# Patient Record
Sex: Male | Born: 1945 | Race: White | Hispanic: No | Marital: Married | Smoking: Former smoker
Health system: Southern US, Community
[De-identification: ages and names within clinical notes are randomized; demographics above are authoritative.]

## PROBLEM LIST (undated history)

## (undated) DIAGNOSIS — N189 Chronic kidney disease, unspecified: Secondary | ICD-10-CM

## (undated) DIAGNOSIS — Z9109 Other allergy status, other than to drugs and biological substances: Secondary | ICD-10-CM

## (undated) DIAGNOSIS — E119 Type 2 diabetes mellitus without complications: Secondary | ICD-10-CM

## (undated) DIAGNOSIS — R200 Anesthesia of skin: Secondary | ICD-10-CM

## (undated) DIAGNOSIS — K219 Gastro-esophageal reflux disease without esophagitis: Secondary | ICD-10-CM

## (undated) DIAGNOSIS — F419 Anxiety disorder, unspecified: Secondary | ICD-10-CM

## (undated) DIAGNOSIS — I10 Essential (primary) hypertension: Secondary | ICD-10-CM

## (undated) DIAGNOSIS — D649 Anemia, unspecified: Secondary | ICD-10-CM

## (undated) DIAGNOSIS — R202 Paresthesia of skin: Secondary | ICD-10-CM

## (undated) HISTORY — PX: HERNIA REPAIR: SHX51

## (undated) HISTORY — PX: COLONOSCOPY: SHX174

## (undated) HISTORY — PX: FRACTURE SURGERY: SHX138

## (undated) HISTORY — PX: TONSILLECTOMY: SUR1361

---

## 2013-03-08 NOTE — H&P (Signed)
History of Present Illness The patient is a 67 year old male who presents today for follow up of their neck. The patient is being followed for their right-sided neck pain. Symptoms reported today include: pain (right lateral cervical radiating pain into the right upper ext. to the level of the hand ) and numbness (right upper ext. ), while the patient does not report symptoms of: weakness. The following medication has been used for pain control: antiinflammatory medication (Aleve).   He returns today for a followup. At this point in time, we have had a long discussion about his cervical degenerative disease. At this point while there are cord signal changes at the C3-4. There is no significant central stenosis. The marked disease is at C5-6 and C6-7. At this point, I am hesitant to recommend a fusin at the C3-4 and C5-6 and C6-7 levels.    Allergies No Known Drug Allergies. 12/07/2012   Social History Tobacco use. never smoker   Medication History Tradjenta ( Oral) Specific dose unknown - Active. Simvastatin ( Oral) Specific dose unknown - Active. MetFORMIN HCl ER ( Oral) Specific dose unknown - Active. Losartan Potassium ( Oral) Specific dose unknown - Active. GlipiZIDE ER ( Oral) Specific dose unknown - Active. Carvedilol ( Oral) Specific dose unknown - Active.   Objective Transcription   Clinically he does not demonstrate any evidence of myelopathy. He continues to have a normal gait pattern. There is a negative Babinski test. There is no clonus. Negative Hoffman's sign. He still has C6 & 7 radiculopathy with neuropathic pain and numbness as well as dysesthesias in those distributions on the right side. There is no shortness of breath or chest pain. The abdomen is soft and nontender. No incontinence of bowel or bladder. He has a significant neck pain with range of motion.    Assessment & Plan Pain, Cervical l Risks of surgery include, but are not limited  to: Throat pain,swallowing difficulty, hoarseness or change in voice, Death, stroke, paralysis, nerve root damage/injury, bleeding, blood clots, loss of bowel/bladder control, hardware failure, or malposition, spinal fluid leak, adjacent segment disease, non-union, need for further surgery, ongoing or worse pain, infection and recurrent disc herniation   Plans Transcription  At this point in time, I think the best course of action is to do an ACDF at C5-6 and C6-7. If he begins to develop any significant symptoms related to the myelopathy, then we will need to address that at a later date. While there is foraminal stenosis which is clearly seen on the MRI, it is mild to moderate. There is some compression to both of the C4 nerve roots, but the foramen is still patent. At this point we reviewed the risks to include infection, bleeding, nerve damage, death, stroke paralysis, failure to heal and need for further surgery, ongoing or worsening pain, loss of bowel and bladder control, throat pain, swallowing difficulties, hoarseness in the voice and a need for a posterior surgery. All questions were encouraged and answered. We will plan on surgery in mid July once we get the primary physician note back. In addition, because this is a multi-level procedure and his age, we will use an external bone stimulator following surgery.

## 2013-03-14 ENCOUNTER — Encounter (HOSPITAL_COMMUNITY): Payer: Self-pay | Admitting: Pharmacy Technician

## 2013-03-16 ENCOUNTER — Encounter (HOSPITAL_COMMUNITY): Payer: Self-pay

## 2013-03-16 ENCOUNTER — Encounter (HOSPITAL_COMMUNITY)
Admission: RE | Admit: 2013-03-16 | Discharge: 2013-03-16 | Disposition: A | Discharge status: Home / Self Care | Payer: Medicare Other | Source: Ambulatory Visit | Attending: Anesthesiology | Admitting: Anesthesiology

## 2013-03-16 ENCOUNTER — Encounter (HOSPITAL_COMMUNITY)
Admission: RE | Admit: 2013-03-16 | Discharge: 2013-03-16 | Disposition: A | Discharge status: Home / Self Care | Payer: Medicare Other | Source: Ambulatory Visit | Attending: Orthopedic Surgery | Admitting: Orthopedic Surgery

## 2013-03-16 HISTORY — DX: Essential (primary) hypertension: I10

## 2013-03-16 HISTORY — DX: Paresthesia of skin: R20.2

## 2013-03-16 HISTORY — DX: Type 2 diabetes mellitus without complications: E11.9

## 2013-03-16 HISTORY — DX: Gastro-esophageal reflux disease without esophagitis: K21.9

## 2013-03-16 HISTORY — DX: Other allergy status, other than to drugs and biological substances: Z91.09

## 2013-03-16 HISTORY — DX: Anesthesia of skin: R20.0

## 2013-03-16 LAB — BASIC METABOLIC PANEL
BUN: 19 mg/dL (ref 6–23)
GFR calc Af Amer: >90 mL/min (ref >90)
GFR calc non Af Amer: 85 mL/min — ABNORMAL LOW (ref >90)
Potassium: 4.1 mEq/L (ref 3.5–5.1)
Sodium: 136 mEq/L (ref 135–145)

## 2013-03-16 LAB — CBC
MCHC: 35.6 g/dL (ref 30.0–36.0)
Platelets: 185 10*3/uL (ref 150–400)
RDW: 12.1 % (ref 11.5–15.5)
WBC: 9.6 10*3/uL (ref 4.0–10.5)

## 2013-03-16 LAB — SURGICAL PCR SCREEN
MRSA, PCR: NEGATIVE
Staphylococcus aureus: NEGATIVE

## 2013-03-16 MED ORDER — CEFAZOLIN SODIUM-DEXTROSE 2-3 GM-% IV SOLR
2.0000 g | INTRAVENOUS | Status: AC
  Administered 2013-03-17: 2 g via INTRAVENOUS
  Filled 2013-03-16: qty 50

## 2013-03-16 NOTE — Pre-Procedure Instructions (Signed)
Jorge [name]  03/16/2013   Your procedure is scheduled on: Thursday, March 17, 2013  Report to [facility] at 5:30 AM.  Call this number if you have problems the morning of surgery: [phone]   Remember:             Do not eat food or drink liquids after midnight.   Take these medicines the morning of surgery with A SIP OF WATER: carvedilol (COREG) 12.5 MG tablet  Stop taking Aspirin and herbal medications. Do not take any NSAIDs ie: Ibuprofen, Advil, Naproxen or any medication  containing Aspirin.  Do not wear jewelry, make-up or nail polish.  Do not wear lotions, powders, or perfumes. You may wear deodorant.  Do not shave 48 hours prior to surgery. Men may shave face and neck.  Do not bring valuables to the hospital.  [facility] is not responsible  for any belongings or valuables.  Contacts, dentures or bridgework may not be worn into surgery.  Leave suitcase in the car. After surgery it may be brought to your room.  For patients admitted to the hospital, checkout time is 11:00 AM the day of discharge.   Patients discharged the day of surgery will not be allowed to drive home.  Name and phone number of your driver:   Special Instructions: Shower using CHG 2 nights before surgery and the night before surgery.  If you shower the day of surgery use CHG.  Use special wash - you have one bottle of CHG for all showers.  You should use approximately 1/3 of the bottle for each shower.   Please read over the following fact sheets that you were given: Pain Booklet, Coughing and Deep Breathing, MRSA Information and Surgical Site Infection Prevention  Bring brace on day of surgery

## 2013-03-16 NOTE — Progress Notes (Signed)
Pt denies SOB, chest pain, and being under the care of a cardiologist. Pt states that an EKG was done at [facility] in [place] recently  # [id number] ; results were requested in addition to latest office notes. Pt also had a stress test done 2 years ago; results requested, received, and placed in pt chart. Pt denies having any other cardiac studies.

## 2013-03-17 ENCOUNTER — Inpatient Hospital Stay (HOSPITAL_COMMUNITY)
Admission: RE | Admit: 2013-03-17 | Discharge: 2013-03-18 | DRG: 473 | Disposition: A | Discharge status: Home Health | Payer: Medicare Other | Source: Ambulatory Visit | Attending: Orthopedic Surgery | Admitting: Orthopedic Surgery

## 2013-03-17 ENCOUNTER — Ambulatory Visit (HOSPITAL_COMMUNITY): Payer: Medicare Other

## 2013-03-17 ENCOUNTER — Encounter (HOSPITAL_COMMUNITY): Payer: Self-pay | Admitting: *Deleted

## 2013-03-17 ENCOUNTER — Encounter (HOSPITAL_COMMUNITY)
Admission: RE | Disposition: A | Discharge status: Home Health | Payer: Self-pay | Source: Ambulatory Visit | Attending: Orthopedic Surgery

## 2013-03-17 ENCOUNTER — Encounter (HOSPITAL_COMMUNITY): Payer: Self-pay | Admitting: Anesthesiology

## 2013-03-17 ENCOUNTER — Inpatient Hospital Stay (HOSPITAL_COMMUNITY): Payer: Medicare Other

## 2013-03-17 ENCOUNTER — Ambulatory Visit (HOSPITAL_COMMUNITY): Payer: Medicare Other | Admitting: Anesthesiology

## 2013-03-17 DIAGNOSIS — M5020 Other cervical disc displacement, unspecified cervical region: Secondary | ICD-10-CM | POA: Diagnosis present

## 2013-03-17 DIAGNOSIS — M47812 Spondylosis without myelopathy or radiculopathy, cervical region: Principal | ICD-10-CM | POA: Diagnosis present

## 2013-03-17 DIAGNOSIS — Z01812 Encounter for preprocedural laboratory examination: Secondary | ICD-10-CM

## 2013-03-17 DIAGNOSIS — Z79899 Other long term (current) drug therapy: Secondary | ICD-10-CM

## 2013-03-17 DIAGNOSIS — I10 Essential (primary) hypertension: Secondary | ICD-10-CM | POA: Diagnosis present

## 2013-03-17 DIAGNOSIS — E119 Type 2 diabetes mellitus without complications: Secondary | ICD-10-CM | POA: Diagnosis present

## 2013-03-17 DIAGNOSIS — K219 Gastro-esophageal reflux disease without esophagitis: Secondary | ICD-10-CM | POA: Diagnosis present

## 2013-03-17 HISTORY — PX: ANTERIOR CERVICAL DECOMP/DISCECTOMY FUSION: SHX1161

## 2013-03-17 LAB — GLUCOSE, CAPILLARY
Glucose-Capillary: 209 mg/dL — ABNORMAL HIGH (ref 70–99)
Glucose-Capillary: 249 mg/dL — ABNORMAL HIGH (ref 70–99)
Glucose-Capillary: 312 mg/dL — ABNORMAL HIGH (ref 70–99)

## 2013-03-17 SURGERY — ANTERIOR CERVICAL DECOMPRESSION/DISCECTOMY FUSION 2 LEVEL/HARDWARE REMOVAL
Anesthesia: General | Site: Neck | Wound class: Clean

## 2013-03-17 MED ORDER — METFORMIN HCL ER 500 MG PO TB24
500.0000 mg | ORAL_TABLET | Freq: Two times a day (BID) | ORAL | Status: DC
  Administered 2013-03-17 – 2013-03-18 (×2): 500 mg via ORAL
  Filled 2013-03-17 (×4): qty 1

## 2013-03-17 MED ORDER — ONDANSETRON HCL 4 MG/2ML IJ SOLN
INTRAMUSCULAR | Status: DC | PRN
  Administered 2013-03-17: 4 mg via INTRAVENOUS

## 2013-03-17 MED ORDER — CEFAZOLIN SODIUM 1-5 GM-% IV SOLN
1.0000 g | Freq: Three times a day (TID) | INTRAVENOUS | Status: AC
  Administered 2013-03-17 – 2013-03-18 (×2): 1 g via INTRAVENOUS
  Filled 2013-03-17 (×2): qty 50

## 2013-03-17 MED ORDER — METHOCARBAMOL 500 MG PO TABS: 500.0000 mg | ORAL_TABLET | Freq: Four times a day (QID) | ORAL | Status: DC | PRN

## 2013-03-17 MED ORDER — LOSARTAN POTASSIUM 50 MG PO TABS
50.0000 mg | ORAL_TABLET | Freq: Every day | ORAL | Status: DC
  Administered 2013-03-18: 50 mg via ORAL
  Filled 2013-03-17 (×2): qty 1

## 2013-03-17 MED ORDER — ONDANSETRON HCL 4 MG/2ML IJ SOLN: 4.0000 mg | Freq: Once | INTRAMUSCULAR | Status: DC | PRN

## 2013-03-17 MED ORDER — SIMVASTATIN 20 MG PO TABS
20.0000 mg | ORAL_TABLET | Freq: Every day | ORAL | Status: DC
  Administered 2013-03-17: 20 mg via ORAL
  Filled 2013-03-17 (×2): qty 1

## 2013-03-17 MED ORDER — PROPOFOL 10 MG/ML IV BOLUS
INTRAVENOUS | Status: DC | PRN
  Administered 2013-03-17: 200 mg via INTRAVENOUS

## 2013-03-17 MED ORDER — OXYCODONE HCL 5 MG PO TABS
10.0000 mg | ORAL_TABLET | ORAL | Status: DC | PRN
  Administered 2013-03-17: 10 mg via ORAL
  Filled 2013-03-17: qty 2

## 2013-03-17 MED ORDER — SODIUM CHLORIDE 0.9 % IV SOLN: 250.0000 mL | INTRAVENOUS | Status: DC

## 2013-03-17 MED ORDER — BUPIVACAINE-EPINEPHRINE 0.25% -1:200000 IJ SOLN
INTRAMUSCULAR | Status: DC | PRN
  Administered 2013-03-17: 4 mL

## 2013-03-17 MED ORDER — DEXAMETHASONE SODIUM PHOSPHATE 10 MG/ML IJ SOLN
INTRAMUSCULAR | Status: AC
  Administered 2013-03-17: 10 mg via INTRAVENOUS
  Filled 2013-03-17: qty 1

## 2013-03-17 MED ORDER — MENTHOL 3 MG MT LOZG: 1.0000 | LOZENGE | OROMUCOSAL | Status: DC | PRN

## 2013-03-17 MED ORDER — ARTIFICIAL TEARS OP OINT
TOPICAL_OINTMENT | OPHTHALMIC | Status: DC | PRN
  Administered 2013-03-17: 1 via OPHTHALMIC

## 2013-03-17 MED ORDER — BUPIVACAINE-EPINEPHRINE PF 0.25-1:200000 % IJ SOLN
INTRAMUSCULAR | Status: AC
  Filled 2013-03-17: qty 30

## 2013-03-17 MED ORDER — FENTANYL CITRATE 0.05 MG/ML IJ SOLN
INTRAMUSCULAR | Status: DC | PRN
Start: 2013-03-17 — End: 2013-03-17
  Administered 2013-03-17: 150 ug via INTRAVENOUS
  Administered 2013-03-17 (×3): 50 ug via INTRAVENOUS

## 2013-03-17 MED ORDER — METHOCARBAMOL 100 MG/ML IJ SOLN
500.0000 mg | Freq: Four times a day (QID) | INTRAVENOUS | Status: DC | PRN
  Filled 2013-03-17: qty 5

## 2013-03-17 MED ORDER — HYDROMORPHONE HCL PF 1 MG/ML IJ SOLN
INTRAMUSCULAR | Status: AC
  Filled 2013-03-17: qty 1

## 2013-03-17 MED ORDER — SODIUM CHLORIDE 0.9 % IJ SOLN: 3.0000 mL | INTRAMUSCULAR | Status: DC | PRN

## 2013-03-17 MED ORDER — ACETAMINOPHEN 10 MG/ML IV SOLN
1000.0000 mg | Freq: Once | INTRAVENOUS | Status: AC
  Administered 2013-03-17: 1000 mg via INTRAVENOUS
  Filled 2013-03-17: qty 100

## 2013-03-17 MED ORDER — SODIUM CHLORIDE 0.9 % IJ SOLN
3.0000 mL | Freq: Two times a day (BID) | INTRAMUSCULAR | Status: DC
  Administered 2013-03-17 – 2013-03-18 (×2): 3 mL via INTRAVENOUS

## 2013-03-17 MED ORDER — LINAGLIPTIN 5 MG PO TABS
5.0000 mg | ORAL_TABLET | Freq: Every day | ORAL | Status: DC
  Administered 2013-03-18: 5 mg via ORAL
  Filled 2013-03-17 (×2): qty 1

## 2013-03-17 MED ORDER — LIDOCAINE HCL (CARDIAC) 20 MG/ML IV SOLN
INTRAVENOUS | Status: DC | PRN
  Administered 2013-03-17: 60 mg via INTRAVENOUS

## 2013-03-17 MED ORDER — LACTATED RINGERS IV SOLN
INTRAVENOUS | Status: DC | PRN
  Administered 2013-03-17 (×2): via INTRAVENOUS

## 2013-03-17 MED ORDER — CARVEDILOL 12.5 MG PO TABS
ORAL_TABLET | ORAL | Status: AC
  Administered 2013-03-17: 12.5 mg via ORAL
  Filled 2013-03-17: qty 1

## 2013-03-17 MED ORDER — GLYCOPYRROLATE 0.2 MG/ML IJ SOLN
INTRAMUSCULAR | Status: DC | PRN
  Administered 2013-03-17: 0.6 mg via INTRAVENOUS

## 2013-03-17 MED ORDER — CARVEDILOL 12.5 MG PO TABS
12.5000 mg | ORAL_TABLET | Freq: Two times a day (BID) | ORAL | Status: AC
Start: 2013-03-17 — End: 2013-03-17
  Filled 2013-03-17: qty 1

## 2013-03-17 MED ORDER — HYDROMORPHONE HCL PF 1 MG/ML IJ SOLN
0.2500 mg | INTRAMUSCULAR | Status: DC | PRN
  Administered 2013-03-17 (×3): 0.5 mg via INTRAVENOUS

## 2013-03-17 MED ORDER — HYDROMORPHONE HCL PF 1 MG/ML IJ SOLN
INTRAMUSCULAR | Status: AC
  Administered 2013-03-17: 0.5 mg via INTRAVENOUS
  Filled 2013-03-17: qty 1

## 2013-03-17 MED ORDER — ACETAMINOPHEN 10 MG/ML IV SOLN
1000.0000 mg | Freq: Four times a day (QID) | INTRAVENOUS | Status: AC
  Administered 2013-03-17 – 2013-03-18 (×3): 1000 mg via INTRAVENOUS
  Filled 2013-03-17 (×4): qty 100

## 2013-03-17 MED ORDER — LACTATED RINGERS IV SOLN
INTRAVENOUS | Status: DC
  Administered 2013-03-17: 14:00:00 via INTRAVENOUS

## 2013-03-17 MED ORDER — DEXAMETHASONE SODIUM PHOSPHATE 10 MG/ML IJ SOLN
4.0000 mg | Freq: Once | INTRAMUSCULAR | Status: DC
  Filled 2013-03-17: qty 0.4

## 2013-03-17 MED ORDER — THROMBIN 20000 UNITS EX SOLR
CUTANEOUS | Status: AC
  Filled 2013-03-17: qty 20000

## 2013-03-17 MED ORDER — CARVEDILOL 12.5 MG PO TABS
12.5000 mg | ORAL_TABLET | Freq: Two times a day (BID) | ORAL | Status: DC
  Administered 2013-03-17 – 2013-03-18 (×2): 12.5 mg via ORAL
  Filled 2013-03-17 (×5): qty 1

## 2013-03-17 MED ORDER — ONDANSETRON HCL 4 MG/2ML IJ SOLN: 4.0000 mg | INTRAMUSCULAR | Status: DC | PRN

## 2013-03-17 MED ORDER — LIDOCAINE HCL 4 % MT SOLN
OROMUCOSAL | Status: DC | PRN
  Administered 2013-03-17: 4 mL via TOPICAL

## 2013-03-17 MED ORDER — PHENOL 1.4 % MT LIQD
1.0000 | OROMUCOSAL | Status: DC | PRN
  Administered 2013-03-17: 1 via OROMUCOSAL
  Filled 2013-03-17: qty 177

## 2013-03-17 MED ORDER — INSULIN ASPART 100 UNIT/ML ~~LOC~~ SOLN
0.0000 [IU] | SUBCUTANEOUS | Status: DC
  Administered 2013-03-17: 11 [IU] via SUBCUTANEOUS
  Administered 2013-03-18: 3 [IU] via SUBCUTANEOUS
  Administered 2013-03-18: 8 [IU] via SUBCUTANEOUS
  Administered 2013-03-18 (×2): 5 [IU] via SUBCUTANEOUS

## 2013-03-17 MED ORDER — GELATIN ABSORBABLE 100 EX MISC
CUTANEOUS | Status: DC | PRN
  Administered 2013-03-17: 08:00:00 via TOPICAL

## 2013-03-17 MED ORDER — ROCURONIUM BROMIDE 100 MG/10ML IV SOLN
INTRAVENOUS | Status: DC | PRN
  Administered 2013-03-17 (×2): 10 mg via INTRAVENOUS
  Administered 2013-03-17: 50 mg via INTRAVENOUS
  Administered 2013-03-17: 10 mg via INTRAVENOUS

## 2013-03-17 MED ORDER — MORPHINE SULFATE 2 MG/ML IJ SOLN: 1.0000 mg | INTRAMUSCULAR | Status: DC | PRN

## 2013-03-17 MED ORDER — ZOLPIDEM TARTRATE 5 MG PO TABS: 5.0000 mg | ORAL_TABLET | Freq: Every evening | ORAL | Status: DC | PRN

## 2013-03-17 MED ORDER — NEOSTIGMINE METHYLSULFATE 1 MG/ML IJ SOLN
INTRAMUSCULAR | Status: DC | PRN
  Administered 2013-03-17: 4 mg via INTRAVENOUS

## 2013-03-17 MED ORDER — ACETAMINOPHEN 10 MG/ML IV SOLN
INTRAVENOUS | Status: AC
  Filled 2013-03-17: qty 100

## 2013-03-17 SURGICAL SUPPLY — 64 items
2 level plate 26mm (Plate) ×2 IMPLANT
BIT DRILL SKYLINE 12MM (BIT) ×1 IMPLANT
BIT DRILL SRG 14X2.2XFLT CHK (BIT) ×1 IMPLANT
BIT DRL SRG 14X2.2XFLT CHK (BIT) ×1
BLADE SURG 15 STRL LF DISP TIS (BLADE) ×1 IMPLANT
BLADE SURG 15 STRL SS (BLADE) ×1
BLADE SURG ROTATE 9660 (MISCELLANEOUS) IMPLANT
BUR EGG ELITE 4.0 (BURR) IMPLANT
BUR MATCHSTICK NEURO 3.0 LAGG (BURR) IMPLANT
CANISTER SUCTION 2500CC (MISCELLANEOUS) ×2 IMPLANT
CLOTH BEACON ORANGE TIMEOUT ST (SAFETY) ×2 IMPLANT
CLSR STERI-STRIP ANTIMIC 1/2X4 (GAUZE/BANDAGES/DRESSINGS) ×2 IMPLANT
CORDS BIPOLAR (ELECTRODE) ×2 IMPLANT
COVER SURGICAL LIGHT HANDLE (MISCELLANEOUS) ×4 IMPLANT
CRADLE DONUT ADULT HEAD (MISCELLANEOUS) ×2 IMPLANT
DEVICE ENDSKLTN TC MED 8MM (Orthopedic Implant) ×1 IMPLANT
DRAPE C-ARM 42X72 X-RAY (DRAPES) ×2 IMPLANT
DRAPE POUCH INSTRU U-SHP 10X18 (DRAPES) ×2 IMPLANT
DRAPE SURG 17X23 STRL (DRAPES) ×2 IMPLANT
DRAPE U-SHAPE 47X51 STRL (DRAPES) ×2 IMPLANT
DRILL BIT SKYLINE 12MM (BIT) ×1
DRILL BIT SKYLINE 14MM (BIT) ×1
DRSG MEPILEX BORDER 4X4 (GAUZE/BANDAGES/DRESSINGS) ×2 IMPLANT
DURAPREP 26ML APPLICATOR (WOUND CARE) ×2 IMPLANT
ELECT COATED BLADE 2.86 ST (ELECTRODE) ×2 IMPLANT
ELECT REM PT RETURN 9FT ADLT (ELECTROSURGICAL) ×2
ELECTRODE REM PT RTRN 9FT ADLT (ELECTROSURGICAL) ×1 IMPLANT
ENDOSKELTON TC IMPLANT 8MM MED (Orthopedic Implant) ×2 IMPLANT
GLOVE BIOGEL PI IND STRL 8.5 (GLOVE) ×1 IMPLANT
GLOVE BIOGEL PI INDICATOR 8.5 (GLOVE) ×1
GLOVE ECLIPSE 8.5 STRL (GLOVE) ×2 IMPLANT
GOWN PREVENTION PLUS XXLARGE (GOWN DISPOSABLE) ×2 IMPLANT
GOWN STRL REIN XL XLG (GOWN DISPOSABLE) ×4 IMPLANT
KIT BASIN OR (CUSTOM PROCEDURE TRAY) ×2 IMPLANT
KIT ROOM TURNOVER OR (KITS) ×2 IMPLANT
NEEDLE SPNL 18GX3.5 QUINCKE PK (NEEDLE) ×2 IMPLANT
NS IRRIG 1000ML POUR BTL (IV SOLUTION) ×2 IMPLANT
PACK ORTHO CERVICAL (CUSTOM PROCEDURE TRAY) ×2 IMPLANT
PACK UNIVERSAL I (CUSTOM PROCEDURE TRAY) ×2 IMPLANT
PAD ARMBOARD 7.5X6 YLW CONV (MISCELLANEOUS) ×4 IMPLANT
PATTIES SURGICAL .25X.25 (GAUZE/BANDAGES/DRESSINGS) ×2 IMPLANT
PIN DISTRACTION 14 (PIN) ×2 IMPLANT
PIN RETAINER PRODISC 14 MM (PIN) ×2 IMPLANT
PLATE SKYLINE TWO LEVEL 28MM (Plate) ×2 IMPLANT
PUTTY BONE DBX 2.5 MIS (Bone Implant) ×2 IMPLANT
RESTRAINT LIMB HOLDER UNIV (RESTRAINTS) ×2 IMPLANT
SCREW SKYLINE 14MM SD-VA (Screw) ×4 IMPLANT
SCREW SKYLINE VAR OS 14MM (Screw) ×8 IMPLANT
SPONGE INTESTINAL PEANUT (DISPOSABLE) ×4 IMPLANT
SPONGE LAP 4X18 X RAY DECT (DISPOSABLE) ×2 IMPLANT
SPONGE SURGIFOAM ABS GEL 100 (HEMOSTASIS) ×2 IMPLANT
SURGIFLO TRUKIT (HEMOSTASIS) IMPLANT
SUT MNCRL AB 3-0 PS2 18 (SUTURE) ×2 IMPLANT
SUT SILK 2 0 (SUTURE)
SUT SILK 2-0 18XBRD TIE 12 (SUTURE) IMPLANT
SUT VIC AB 2-0 CT1 18 (SUTURE) ×2 IMPLANT
SYR BULB IRRIGATION 50ML (SYRINGE) ×2 IMPLANT
SYR CONTROL 10ML LL (SYRINGE) ×2 IMPLANT
TAPE CLOTH 4X10 WHT NS (GAUZE/BANDAGES/DRESSINGS) ×2 IMPLANT
TAPE UMBILICAL COTTON 1/8X30 (MISCELLANEOUS) ×2 IMPLANT
TOWEL OR 17X24 6PK STRL BLUE (TOWEL DISPOSABLE) ×4 IMPLANT
TOWEL OR 17X26 10 PK STRL BLUE (TOWEL DISPOSABLE) ×2 IMPLANT
TRAY FOLEY CATH 16FRSI W/METER (SET/KITS/TRAYS/PACK) ×2 IMPLANT
WATER STERILE IRR 1000ML POUR (IV SOLUTION) ×2 IMPLANT

## 2013-03-17 NOTE — Transfer of Care (Signed)
Immediate Anesthesia Transfer of Care Note  Patient: Jorge [name]  Procedure(s) Performed: Procedure(s): ANTERIOR CERVICAL DECOMPRESSION/DISCECTOMY FUSION 2 LEVEL C5-7 (N/A)  Patient Location: PACU  Anesthesia Type:General  Level of Consciousness: awake, alert  and oriented  Airway & Oxygen Therapy: Patient Spontanous Breathing and Patient connected to nasal cannula oxygen  Post-op Assessment: Report given to PACU RN  Post vital signs: Reviewed and stable  Complications: No apparent anesthesia complications

## 2013-03-17 NOTE — Op Note (Signed)
NAMEHAYES, CZAJA                 ACCOUNT NO.:  [id number]  MEDICAL RECORD NO.:  [id number]  LOCATION:  5N30C                        FACILITY:  [facility]  PHYSICIAN:  Alvy Beal, MD    DATE OF BIRTH:  [birth year]  DATE OF PROCEDURE:  03/17/2013 DATE OF DISCHARGE:                              OPERATIVE REPORT   PREOPERATIVE DIAGNOSIS:  Cervical spondylotic radiculopathy.  POSTOPERATIVE DIAGNOSIS:  Cervical spondylotic radiculopathy.  PROCEDURE: 1. Anterior cervical diskectomy, C5-6. 2. Anterior cervical fusion C5-6, C6-7.  INTRAOPERATIVE FINDINGS:  Auto fusion of the C6-7 level.  No demonstrable disk space was identified.  I performed an ACDF at C5-6 without incident removing the large bone spur posterolateral right side.  COMPLICATIONS:  None.  CONDITION:  Stable.  INSTRUMENTATION SYSTEM:  Titan titanium intervertebral cage, size 8 medium packed with DBX mix and a 26 mm anterior cervical plate from [employer].  HISTORY:  This is a very pleasant gentleman, who presented with significant neck and radicular arm pain.  Attempts at conservative management had failed to alleviate his symptoms.  We had a long discussion with surgical management.  The C3-4 level shows disk herniation, but no significant central stenosis, but there is myelomalacia.  However, the patient had demonstrated no myelopathic findings.  His only complaint was radicular arm pain.  As a result, we elected to proceed with the aforementioned procedure.  I also did consent him for an ACDF at C6-7 and in case I could create a disk space it would also diffuse.  OPERATIVE NOTE:  The patient was brought to the operating room, placed supine on the operating table.  After successful induction of general anesthesia and endotracheal intubation, TEDs, SCDs, and Foley were inserted.  The neck was then prepped and draped in a standard fashion. Towels were placed between the shoulder blades.  Time-out was done confirming  patient, procedure, and all other pertinent important data.  Once this was completed, a transverse incision was made and centered over the C6 vertebral body as a left-sided transverse incision.  A standard Smith-Robinson approach to the anterior cervical spine was taken.  I dissected down through the platysma.  I then identified the medial border of the sternocleidomastoid and dissected into the deep cervical fascia.  I eventually identified the omohyoid and sacrificed. I then swept the trachea and esophagus over to the right and identified the carotid sheath and protected it with a finger.  I could now see the anterior cervical spine.  Using [employer], I mobilized the remaining prevertebral fascia to completely expose the anterior and longitudinal ligament.  I then placed a needle into the C5-6 disk space took an x-ray and confirmed that I was at the appropriate level.  Once this was done, I then mobilized the longus coli muscle using bipolar electrocautery from the midbody of C5 to the midbody of C7.  Once this was done, I then removed the anterior osteophytes from the C5-6 and C6-7 disk space.  At this point, once I removed the C6-7 osteophyte.  I could not see any clear frank disk material.  I then probed using a small nerve curette to see if I could continue to  dissect through the bone spur.  However, I could not see any demonstrable disk space.  At this point, I elected not to continue to take down the autofusion as I would simply have to put a cage in there to recreate the fusion that has already been present.  As a result, I then repositioned my retractors to expose C5-6, and placed retracting pins into the body of C5 and C6, and gently distracted the space.  Annulotomy was performed at the C5-6 level and using a combination of pituitary rongeurs and curettes, I removed all the disk material.  I distracted the disk space and then removed the posterior anulus using a fine  nerve curette.  I then used a fine nerve hook to develop a plane underneath the posterior longitudinal ligament and then exploited this with my 1 mm Kerrison to resect the posterior longitudinal ligament.  I then identified the significant uncovertebral joint osteophytes, especially on the right side, and resected these with a 1 mm Kerrison.  I removed fragments of hard disk osteophyte as well. With the diskectomy complete, I rasped the endplates and then trialed with spacers.  I elected to use the 8 medium lordotic spacer.  It was packed with DBX.  This was malleted to the appropriate depth.  Since there was no demonstrable disk space that I could see, I elected just to do the fusion from C5 down to C7.  That way if there was even some slight micromotion remained at C6-7 disk space, it would be minimize by the spanning cervical plate.  I used the appropriate-sized plate and secured it with rescue screws at C5 and C7 and regular screws at C6. All screws were 14 mm in length.  They were then locked according to manufacturer's standards.  I removed all the retractors, and irrigated the wound copiously with normal saline, obtained hemostasis using bipolar electrocautery.  The platysma was then reapproximated with 2-0 Vicryl sutures and the skin was reapproximated with a 3-0 Monocryl. Steri-Strips and dry dressing were then placed.  At the end of the case, all needle and sponge counts were correct.     Alvy Beal, MD     DDB/MEDQ  D:  03/17/2013  T:  03/17/2013  Job:  [id number]

## 2013-03-17 NOTE — Brief Op Note (Signed)
03/17/2013  10:37 AM  PATIENT:  Jorge [name]  67 y.o. male  PRE-OPERATIVE DIAGNOSIS:  CERVICAL SPONDYLOSIS  POST-OPERATIVE DIAGNOSIS:  CERVICAL SPONDYLOSIS  PROCEDURE:  Procedure(s): ANTERIOR CERVICAL DECOMPRESSION/DISCECTOMY FUSION 2 LEVEL C5-7 (N/A)  SURGEON:  Surgeon(s) and Role:    * Venita Lick, MD - Primary  PHYSICIAN ASSISTANT:   ASSISTANTS: none   ANESTHESIA:   general  EBL:  Total I/O In: 1700 [I.V.:1700] Out: 350 [Urine:250; Blood:100]  BLOOD ADMINISTERED:none  DRAINS: none   LOCAL MEDICATIONS USED:  MARCAINE     SPECIMEN:  No Specimen  DISPOSITION OF SPECIMEN:  N/A  COUNTS:  YES  TOURNIQUET:  * No tourniquets in log *  DICTATION: .Other Dictation: Dictation Number [id number]  PLAN OF CARE: Admit for overnight observation  PATIENT DISPOSITION:  PACU - hemodynamically stable.

## 2013-03-17 NOTE — H&P (Signed)
No change in clinical exam H+P reviewed  

## 2013-03-17 NOTE — Anesthesia Preprocedure Evaluation (Signed)
Anesthesia Evaluation  Patient identified by MRN, date of birth, ID band Patient awake    Reviewed: Allergy & Precautions, H&P , NPO status , Patient's Chart, lab work & pertinent test results  Airway       Dental   Pulmonary former smoker,          Cardiovascular hypertension,     Neuro/Psych    GI/Hepatic GERD-  ,  Endo/Other  diabetes, Type 2, Oral Hypoglycemic Agents  Renal/GU      Musculoskeletal   Abdominal   Peds  Hematology   Anesthesia Other Findings   Reproductive/Obstetrics                           Anesthesia Physical Anesthesia Plan  ASA: III  Anesthesia Plan: General   Post-op Pain Management:    Induction: Intravenous  Airway Management Planned: Oral ETT  Additional Equipment:   Intra-op Plan:   Post-operative Plan: Extubation in OR  Informed Consent: I have reviewed the patients History and Physical, chart, labs and discussed the procedure including the risks, benefits and alternatives for the proposed anesthesia with the patient or authorized representative who has indicated his/her understanding and acceptance.     Plan Discussed with: CRNA, Anesthesiologist and Surgeon  Anesthesia Plan Comments:         Anesthesia Quick Evaluation

## 2013-03-17 NOTE — Preoperative (Signed)
Beta Blockers   Reason not to administer Beta Blockers:Not Applicable 

## 2013-03-17 NOTE — Progress Notes (Signed)
Received report from [facility]. Patient VSS, no complaints at this time.

## 2013-03-18 LAB — HEMOGLOBIN A1C
Hgb A1c MFr Bld: 8.4 % — ABNORMAL HIGH (ref <5.7)
Mean Plasma Glucose: 194 mg/dL — ABNORMAL HIGH (ref <117)

## 2013-03-18 LAB — GLUCOSE, CAPILLARY
Glucose-Capillary: 217 mg/dL — ABNORMAL HIGH (ref 70–99)
Glucose-Capillary: 221 mg/dL — ABNORMAL HIGH (ref 70–99)

## 2013-03-18 MED ORDER — ONDANSETRON HCL 4 MG PO TABS: 4.0000 mg | ORAL_TABLET | Freq: Three times a day (TID) | ORAL | Status: DC | PRN

## 2013-03-18 MED ORDER — POLYETHYLENE GLYCOL 3350 17 GM/SCOOP PO POWD: 17.0000 g | Freq: Every day | ORAL | Status: DC

## 2013-03-18 MED ORDER — HYDROCODONE-ACETAMINOPHEN 10-325 MG PO TABS
1.0000 | ORAL_TABLET | Freq: Four times a day (QID) | ORAL | Status: DC | PRN
Start: 2013-03-18 — End: 2017-12-21

## 2013-03-18 MED ORDER — DOCUSATE SODIUM 100 MG PO CAPS: 100.0000 mg | ORAL_CAPSULE | Freq: Three times a day (TID) | ORAL | Status: DC | PRN

## 2013-03-18 MED ORDER — METHOCARBAMOL 500 MG PO TABS: 500.0000 mg | ORAL_TABLET | Freq: Three times a day (TID) | ORAL | Status: DC | PRN

## 2013-03-18 NOTE — Discharge Summary (Signed)
Patient ID: Jorge [name] MRN: [id number] DOB/AGE: May 09, 1946 67 y.o.  Admit date: 03/17/2013 Discharge date: 03/18/2013  Admission Diagnoses:  Active Problems:   * No active hospital problems. *   Discharge Diagnoses:  Active Problems:   * No active hospital problems. *  status post Procedure(s): ANTERIOR CERVICAL DECOMPRESSION/DISCECTOMY FUSION 2 LEVEL C5-7  Past Medical History  Diagnosis Date  . Environmental allergies     Hx: of  . Hypertension   . Diabetes mellitus without complication   . GERD (gastroesophageal reflux disease)   . Numbness and tingling     Hx; of  Right shoulder    Surgeries: Procedure(s): ANTERIOR CERVICAL DECOMPRESSION/DISCECTOMY FUSION 2 LEVEL C5-7 on 03/17/2013   Consultants:  none  Discharged Condition: Improved  Hospital Course: Jorge [name] is an 67 y.o. male who was admitted 03/17/2013 for operative treatment of <principal problem not specified>. Patient failed conservative treatments (please see the history and physical for the specifics) and had severe unremitting pain that affects sleep, daily activities and work/hobbies. After pre-op clearance, the patient was taken to the operating room on 03/17/2013 and underwent  Procedure(s): ANTERIOR CERVICAL DECOMPRESSION/DISCECTOMY FUSION 2 LEVEL C5-7.    Patient was given perioperative antibiotics: Anti-infectives   Start     Dose/Rate Route Frequency Ordered Stop   03/17/13 1600  ceFAZolin (ANCEF) IVPB 1 g/50 mL premix     1 g 100 mL/hr over 30 Minutes Intravenous Every 8 hours 03/17/13 1516 03/18/13 0102   03/16/13 1416  ceFAZolin (ANCEF) IVPB 2 g/50 mL premix     2 g 100 mL/hr over 30 Minutes Intravenous 30 min pre-op 03/16/13 1416 03/17/13 0745       Patient was given sequential compression devices and early ambulation to prevent DVT.   Patient benefited maximally from hospital stay and there were no complications. At the time of discharge, the patient was urinating/moving their bowels  without difficulty, tolerating a regular diet, pain is controlled with oral pain medications and they have been cleared by PT/OT.   Recent vital signs: Patient Vitals for the past 24 hrs:  BP Temp Temp src Pulse Resp SpO2  03/18/13 0613 151/66 mmHg 98 F (36.7 C) Oral 78 18 97 %  03/17/13 2047 151/76 mmHg 98 F (36.7 C) Oral 96 18 96 %  03/17/13 2040 - - Oral 86 18 -  03/17/13 1505 160/86 mmHg - - 94 12 94 %  03/17/13 1445 149/93 mmHg 97.8 F (36.6 C) - 89 10 95 %  03/17/13 1430 168/85 mmHg 97.6 F (36.4 C) - 100 10 90 %  03/17/13 1415 153/91 mmHg - - 89 10 96 %  03/17/13 1400 159/95 mmHg - - 89 14 96 %  03/17/13 1345 160/77 mmHg - - 90 16 96 %  03/17/13 1330 152/90 mmHg 97.5 F (36.4 C) - 90 15 95 %  03/17/13 1315 146/84 mmHg - - 90 19 95 %  03/17/13 1300 154/82 mmHg - - 92 21 95 %  03/17/13 1245 155/89 mmHg - - 92 15 96 %  03/17/13 1230 160/82 mmHg - - 91 12 96 %  03/17/13 1215 140/81 mmHg - - 84 11 95 %  03/17/13 1200 157/81 mmHg - - 77 12 95 %  03/17/13 1145 147/71 mmHg - - 79 12 94 %  03/17/13 1130 155/81 mmHg - - 85 17 95 %  03/17/13 1115 161/72 mmHg - - 70 12 94 %  03/17/13 1105 146/73 mmHg - - 69 13  95 %  03/17/13 1045 153/76 mmHg - - 77 13 92 %  03/17/13 1039 167/89 mmHg 98 F (36.7 C) - - - 93 %     Recent laboratory studies:  Recent Labs  03/16/13 0956 03/16/13 1525  WBC  --  9.6  HGB  --  15.3  HCT  --  43.0  PLT  --  185  NA 136  --   K 4.1  --   CL 102  --   CO2 26  --   BUN 19  --   CREATININE 0.93  --   GLUCOSE 203*  --   CALCIUM 9.1  --      Discharge Medications:     Medication List    ASK your doctor about these medications       carvedilol 12.5 MG tablet  Commonly known as:  COREG  Take 12.5 mg by mouth 2 (two) times daily with a meal.     linagliptin 5 MG Tabs tablet  Commonly known as:  TRADJENTA  Take 5 mg by mouth daily with breakfast.     losartan 50 MG tablet  Commonly known as:  COZAAR  Take 50 mg by mouth daily with  breakfast.     metFORMIN 500 MG 24 hr tablet  Commonly known as:  GLUCOPHAGE-XR  Take 500 mg by mouth 2 (two) times daily with a meal.     simvastatin 20 MG tablet  Commonly known as:  ZOCOR  Take 20 mg by mouth at bedtime.     ZYRTEC PO  Take 1 tablet by mouth daily as needed (allergies).        Diagnostic Studies: Dg Chest 2 View  03/16/2013   *RADIOLOGY REPORT*  Clinical Data: Hypertension, preop  CHEST - 2 VIEW  Comparison: CT chest of 01/09/2012 and chest x-ray of 01/06/2005  Findings: No active infiltrate or effusion is seen.  Mild cardiomegaly is stable.  Mediastinal contours are stable.  Mild degenerative changes are noted throughout the mid to lower thoracic spine.  IMPRESSION: Stable chest x-ray.  No active lung disease.  Stable mild cardiomegaly.   Original Report Authenticated By: Dwyane Dee, M.D.   Dg Cervical Spine 2-3 Views  03/17/2013   *RADIOLOGY REPORT*  Clinical Data: Postop ACDF.  DG C-ARM 1-60 MIN,CERVICAL SPINE - 2-3 VIEW  Comparison: Cervical spine radiographs 03/16/2013.  Findings: Technique:  C-arm fluoroscopic images were obtained intraoperatively and submitted for postoperative interpretation. Please see the performing provider's procedural report for the fluoroscopy time utilized.  Patient is status post C5-C7 ACDF with an anterior plate and prostatic disc at C5-C6.  IMPRESSION: Intraoperative views following C5-C7 ACDF.  No demonstrated complication.   Original Report Authenticated By: Carey Bullocks, M.D.   Dg Cervical Spine 2 Or 3 Views  03/17/2013   *RADIOLOGY REPORT*  Clinical Data: Follow-up cervical fusion.  CERVICAL SPINE - 2-3 VIEW  Comparison: Intraoperative radiographs same date.  Preoperative examination 03/16/2013.  Findings: Patient is status post C5-C7 ACDF with an anterior plate and prosthetic disc at C5-C6.  Hardware appears well positioned. The alignment is normal.  No complications are identified.  IMPRESSION: No demonstrated complication following  C5-C7 ACDF.   Original Report Authenticated By: Carey Bullocks, M.D.   Dg Cervical Spine 2 Or 3 Views  03/16/2013   *RADIOLOGY REPORT*  Clinical Data: Preop for anterior cervical spine fusion  CERVICAL SPINE - 2-3 VIEW  Comparison: None.  Findings: There is slight retrolisthesis of C3 on C4 by  5 mm with degenerative disc disease at C3-4.  There is also degenerative disc disease at C5-6 and C6-7 levels with considerable loss of joint space particularly at C6-7.  No prevertebral soft tissue swelling is seen.  The lung apices are clear.  IMPRESSION:  1.  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 2.  5 mm retrolisthesis of C3 on C4 most likely degenerative in origin.   Original Report Authenticated By: Dwyane Dee, M.D.   Dg C-arm 1-60 Min  03/17/2013   *RADIOLOGY REPORT*  Clinical Data: Postop ACDF.  DG C-ARM 1-60 MIN,CERVICAL SPINE - 2-3 VIEW  Comparison: Cervical spine radiographs 03/16/2013.  Findings: Technique:  C-arm fluoroscopic images were obtained intraoperatively and submitted for postoperative interpretation. Please see the performing provider's procedural report for the fluoroscopy time utilized.  Patient is status post C5-C7 ACDF with an anterior plate and prostatic disc at C5-C6.  IMPRESSION: Intraoperative views following C5-C7 ACDF.  No demonstrated complication.   Original Report Authenticated By: Carey Bullocks, M.D.          Follow-up Information   Follow up with Alvy Beal, MD. Call in 2 weeks.   Contact information:   [address] [place] [address] [phone]       Discharge Plan:  discharge to home  Disposition: stable    Signed: Venita Lick D for Dr. Venita Lick [facility] [phone] 03/18/2013, 7:46 AM

## 2013-03-18 NOTE — Evaluation (Signed)
Occupational Therapy Evaluation Patient Details Name: Jorge [name] MRN: [id number] DOB: 17-Jun-1946 Today's Date: 03/18/2013 Time:  -     OT Assessment / Plan / Recommendation History of present illness  Pt is a 67 yr old male admitted for ANTERIOR CERVICAL DECOMPRESSION/DISCECTOMY FUSION 2 LEVEL C5-7.   Clinical Impression   Pt currently modified independent to independent for mobility and selfcare tasks following cervical precautions.  No further OT needs or DME at this time.      OT Assessment  Patient does not need any further OT services    Follow Up Recommendations  No OT follow up       Equipment Recommendations  None recommended by OT          Precautions / Restrictions Precautions Precautions: Cervical Precaution Comments: pt. was provided with cervical precautions sheet and educated accordingly Required Braces or Orthoses: Cervical Brace Cervical Brace: Hard collar Restrictions Weight Bearing Restrictions: No   Pertinent Vitals/Pain Pt with no report of pain during session    ADL  Eating/Feeding: Simulated;Independent Where Assessed - Eating/Feeding: Chair Grooming: Simulated;Modified independent Where Assessed - Grooming: Unsupported standing Upper Body Bathing: Simulated;Modified independent Where Assessed - Upper Body Bathing: Unsupported standing Lower Body Bathing: Simulated;Supervision/safety Where Assessed - Lower Body Bathing: Unsupported standing Upper Body Dressing: Performed;Modified independent Where Assessed - Upper Body Dressing: Unsupported sitting Lower Body Dressing: Performed Where Assessed - Lower Body Dressing: Unsupported sit to stand Toilet Transfer: Performed;Modified independent Toilet Transfer Method: Other (comment) (ambulate without assistive device) Toilet Transfer Equipment: Comfort height toilet Toileting - Clothing Manipulation and Hygiene: Simulated;Modified independent Where Assessed - Toileting Clothing Manipulation and  Hygiene: Sit to stand from 3-in-1 or toilet Tub/Shower Transfer: Performed;Modified independent Tub/Shower Transfer Method: Ambulating Equipment Used: Other (comment) (cervical collar) Transfers/Ambulation Related to ADLs: Pt is currently independent with mobility.   ADL Comments: Pt is currenlty modified independent to independent with selfcare tasks and mobility.  Educated pt and wife on safe use of AE for reaching to the floor and for LB dressing if needed.  Also discussed cervical precautions and use of button up shirts to prevent reaching overhead.  Wife will provide supervision initially at discharge.      Visit Information  Last OT Received On: 03/18/13 Assistance Needed: +1 History of Present Illness:  Pt is a 67 yr old male admitted for ANTERIOR CERVICAL DECOMPRESSION/DISCECTOMY FUSION 2 LEVEL C5-7.       Prior Functioning     Home Living Family/patient expects to be discharged to:: Private residence Living Arrangements: Spouse/significant other Available Help at Discharge: Available 24 hours/day Type of Home: House Home Access: Stairs to enter [employer] of Steps: 1 Entrance Stairs-Rails: None Home Layout: One level Home Equipment: Environmental consultant - 2 wheels;Cane - single point Prior Function Level of Independence: Independent Communication Communication: No difficulties Dominant Hand: Right         Vision/Perception Vision - History Baseline Vision: No visual deficits Patient Visual Report: No change from baseline Vision - Assessment Eye Alignment: Within Functional Limits Vision Assessment: Vision not tested Perception Perception: Within Functional Limits Praxis Praxis: Intact   Cognition  Cognition Arousal/Alertness: Awake/alert Behavior During Therapy: WFL for tasks assessed/performed Overall Cognitive Status: Within Functional Limits for tasks assessed       Mobility Transfers Transfers: Sit to Stand;Stand to Sit Sit to Stand: 7:  Independent;Without upper extremity assist;From toilet Stand to Sit: 7: Independent;Without upper extremity assist;To toilet        Balance Balance Balance Assessed: Yes Dynamic  Standing Balance Dynamic Standing - Balance Support: No upper extremity supported Dynamic Standing - Level of Assistance: 7: Independent   End of Session OT - End of Session Equipment Utilized During Treatment: Cervical collar Activity Tolerance: Patient tolerated treatment well Patient left: in chair;with family/visitor present Nurse Communication: Mobility status     Arlie Posch OTR/L Pager number [phone] 03/18/2013, 1:12 PM

## 2013-03-18 NOTE — Care Management Note (Signed)
    Page 1 of 1   03/18/2013     11:17:40 AM   CARE MANAGEMENT NOTE 03/18/2013  Patient:  Jorge [name], Jorge [name]   Account Number:  [id number]  Date Initiated:  03/18/2013  Documentation initiated by:  Jacquelynn Cree  Subjective/Objective Assessment:   admitted postop ACDF C5-7     Action/Plan:   Pt/OT evals-no follow up recommended, has rolling walker at home   Anticipated DC Date:  03/18/2013   Anticipated DC Plan:  HOME/SELF CARE      DC Planning Services  CM consult      Choice offered to / List presented to:             Status of service:  Completed, signed off Medicare Important Message given?   (If response is "NO", the following Medicare IM given date fields will be blank) Date Medicare IM given:   Date Additional Medicare IM given:    Discharge Disposition:  HOME/SELF CARE  Per UR Regulation:    If discussed at Long Length of Stay Meetings, dates discussed:    Comments:  03/18/13 Spoke with patient and physical therapist, patient does not require HHPT. Patient's wife will be availble to assist him post d/c. No equipment needs identified, has rolling walker at home. Jacquelynn Cree RN, BSN, CCM

## 2013-03-18 NOTE — Progress Notes (Signed)
    Subjective: Procedure(s) (LRB): ANTERIOR CERVICAL DECOMPRESSION/DISCECTOMY FUSION 2 LEVEL C5-7 (N/A) 1 Day Post-Op  Patient reports pain as 2 on 0-10 scale.  Reports decreased arm pain reports incisional neck pain   Positive void Negative bowel movement Positive flatus Negative chest pain or shortness of breath  Objective: Vital signs in last 24 hours: Temp:  [97.5 F (36.4 C)-98 F (36.7 C)] 98 F (36.7 C) (07/11 1610) Pulse Rate:  [69-100] 78 (07/11 0613) Resp:  [10-21] 18 (07/11 0613) BP: (140-168)/(66-95) 151/66 mmHg (07/11 0613) SpO2:  [90 %-97 %] 97 % (07/11 9604)  Intake/Output from previous day: 07/10 0701 - 07/11 0700 In: 2050 [I.V.:2050] Out: 1200 [Urine:1100; Blood:100]  Labs:  Recent Labs  03/16/13 1525  WBC 9.6  RBC 4.86  HCT 43.0  PLT 185    Recent Labs  03/16/13 0956  NA 136  K 4.1  CL 102  CO2 26  BUN 19  CREATININE 0.93  GLUCOSE 203*  CALCIUM 9.1   No results found for this basename: LABPT, INR,  in the last 72 hours  Physical Exam: Neurologically intact ABD soft Intact pulses distally Incision: dressing C/D/I and no drainage Compartment soft  Assessment/Plan: Patient stable  xrays satisfactory Mobilization with physical therapy Encourage incentive spirometry Continue care  Up with therapy Discharge home with home health Doing well.  Ok for d/c to home   Venita Lick, MD [facility] [phone]

## 2013-03-18 NOTE — Evaluation (Signed)
Physical Therapy Evaluation Patient Details Name: Jorge [name] MRN: [id number] DOB: September 16, 1945 Today's Date: 03/18/2013 Time: 4098-1191 PT Time Calculation (min): 26 min  PT Assessment / Plan / Recommendation History of Present Illness  Pt. admitted with  sided neck pain and R arm pain and numbness.  He underwent ACDF 2 level C 5-7.  Clinical Impression  Pt. Is moving well with PT on his first time up.  He is able to ambulate around the nursing unit at an independent level and has no equipment or follow up needs.  His cervical education is complete and he has home DC orders.  Will sign off.    PT Assessment  Patent does not need any further PT services    Follow Up Recommendations  No PT follow up    Does the patient have the potential to tolerate intense rehabilitation      Barriers to Discharge        Equipment Recommendations  None recommended by PT    Recommendations for Other Services     Frequency      Precautions / Restrictions Precautions Precautions: Cervical Precaution Comments: pt. was provided with cervical precautions sheet and educated accordingly Required Braces or Orthoses: Cervical Brace Cervical Brace: Hard collar Restrictions Weight Bearing Restrictions: No   Pertinent Vitals/Pain See vitals tab       Mobility  Bed Mobility Bed Mobility: Rolling Left;Left Sidelying to Sit;Sitting - Scoot to Edge of Bed Rolling Left: 6: Modified independent (Device/Increase time);With rail Left Sidelying to Sit: 6: Modified independent (Device/Increase time);HOB flat Sitting - Scoot to Edge of Bed: 6: Modified independent (Device/Increase time) Details for Bed Mobility Assistance: cues for technique and cervical precautions to avoid strain Transfers Transfers: Sit to Stand;Stand to Sit Sit to Stand: 7: Independent;Without upper extremity assist;From bed Stand to Sit: 7: Independent;Without upper extremity assist;To chair/3-in-1 Details for Transfer Assistance:  manages well without UE assist Ambulation/Gait Ambulation/Gait Assistance: 7: Independent Ambulation Distance (Feet): 350 Feet Assistive device: None Ambulation/Gait Assistance Details: pt. managing ambulation without device and no overt LOB noted.   Gait Pattern: Within Functional Limits Stairs: Yes Stairs Assistance: 6: Modified independent (Device/Increase time) Stair Management Technique: No rails;Forwards Number of Stairs: 3    Exercises     PT Diagnosis:    PT Problem List:   PT Treatment Interventions:       PT Goals(Current goals can be found in the care plan section)    Visit Information  Last PT Received On: 03/18/13 Assistance Needed: +1 History of Present Illness: Pt. admitted with  sided neck pain and R arm pain and numbness.  He underwent ACDF 2 level C 5-7.       Prior Functioning  Home Living Family/patient expects to be discharged to:: Private residence Living Arrangements: Spouse/significant other Available Help at Discharge: Available 24 hours/day Type of Home: House Home Access: Stairs to enter [employer] of Steps: 1 Entrance Stairs-Rails: None Home Layout: One level Home Equipment: Environmental consultant - 2 wheels;Cane - single point Prior Function Level of Independence: Independent Communication Communication: No difficulties Dominant Hand: Right    Cognition  Cognition Arousal/Alertness: Awake/alert Behavior During Therapy: WFL for tasks assessed/performed Overall Cognitive Status: Within Functional Limits for tasks assessed    Extremity/Trunk Assessment Upper Extremity Assessment Upper Extremity Assessment: Overall WFL for tasks assessed Lower Extremity Assessment Lower Extremity Assessment: Overall WFL for tasks assessed Cervical / Trunk Assessment Cervical / Trunk Assessment: Other exceptions Cervical / Trunk Exceptions: Pt currently in cervical collar  Balance Balance Balance Assessed: Yes Dynamic Standing Balance Dynamic Standing  - Balance Support: No upper extremity supported Dynamic Standing - Level of Assistance: 7: Independent  End of Session PT - End of Session Equipment Utilized During Treatment: Gait belt;Cervical collar Activity Tolerance: Patient tolerated treatment well Patient left: in chair;with call bell/phone within reach;with family/visitor present Nurse Communication: Mobility status  GP     Ferman Hamming 03/18/2013, 3:09 PM Weldon Picking PT Acute Rehab Services [phone] Bee[facility] [phone]

## 2013-03-18 NOTE — Anesthesia Postprocedure Evaluation (Signed)
  Anesthesia Post-op Note  Patient: Jorge [name]  Procedure(s) Performed: Procedure(s): ANTERIOR CERVICAL DECOMPRESSION/DISCECTOMY FUSION 2 LEVEL C5-7 (N/A)  Patient Location: PACU  Anesthesia Type:General  Level of Consciousness: awake, oriented and patient cooperative  Airway and Oxygen Therapy: Patient Spontanous Breathing  Post-op Pain: mild  Post-op Assessment: Post-op Vital signs reviewed, Patient's Cardiovascular Status Stable, Respiratory Function Stable, Patent Airway, No signs of Nausea or vomiting and Pain level controlled  Post-op Vital Signs: stable  Complications: No apparent anesthesia complications

## 2013-03-22 ENCOUNTER — Encounter (HOSPITAL_COMMUNITY): Payer: Self-pay | Admitting: Orthopedic Surgery

## 2016-07-04 DIAGNOSIS — E1122 Type 2 diabetes mellitus with diabetic chronic kidney disease: Secondary | ICD-10-CM

## 2016-07-04 DIAGNOSIS — I10 Essential (primary) hypertension: Secondary | ICD-10-CM | POA: Diagnosis present

## 2016-07-04 DIAGNOSIS — N1832 Type 2 diabetes mellitus with diabetic chronic kidney disease: Secondary | ICD-10-CM

## 2017-02-04 ENCOUNTER — Telehealth: Payer: Self-pay | Admitting: Endocrinology

## 2017-02-04 NOTE — Telephone Encounter (Signed)
Patient calling to get new patient appointment.  Referral from [facility]- [phone]

## 2017-11-18 ENCOUNTER — Observation Stay (HOSPITAL_COMMUNITY)
Admission: EM | Admit: 2017-11-18 | Discharge: 2017-11-20 | Disposition: A | Discharge status: Home / Self Care | Payer: Medicare Other | Attending: Nephrology | Admitting: Nephrology

## 2017-11-18 ENCOUNTER — Emergency Department (HOSPITAL_COMMUNITY): Payer: Medicare Other

## 2017-11-18 ENCOUNTER — Other Ambulatory Visit: Payer: Self-pay

## 2017-11-18 ENCOUNTER — Encounter (HOSPITAL_COMMUNITY): Payer: Self-pay | Admitting: Radiology

## 2017-11-18 DIAGNOSIS — E119 Type 2 diabetes mellitus without complications: Secondary | ICD-10-CM

## 2017-11-18 DIAGNOSIS — I129 Hypertensive chronic kidney disease with stage 1 through stage 4 chronic kidney disease, or unspecified chronic kidney disease: Secondary | ICD-10-CM | POA: Diagnosis not present

## 2017-11-18 DIAGNOSIS — G459 Transient cerebral ischemic attack, unspecified: Secondary | ICD-10-CM | POA: Diagnosis not present

## 2017-11-18 DIAGNOSIS — N1831 Chronic kidney disease, stage 3a: Secondary | ICD-10-CM | POA: Diagnosis present

## 2017-11-18 DIAGNOSIS — N183 Chronic kidney disease, stage 3 unspecified: Secondary | ICD-10-CM | POA: Diagnosis present

## 2017-11-18 DIAGNOSIS — R4781 Slurred speech: Secondary | ICD-10-CM | POA: Diagnosis present

## 2017-11-18 DIAGNOSIS — G8929 Other chronic pain: Secondary | ICD-10-CM | POA: Insufficient documentation

## 2017-11-18 DIAGNOSIS — R0602 Shortness of breath: Secondary | ICD-10-CM

## 2017-11-18 DIAGNOSIS — Z79899 Other long term (current) drug therapy: Secondary | ICD-10-CM | POA: Insufficient documentation

## 2017-11-18 DIAGNOSIS — E1122 Type 2 diabetes mellitus with diabetic chronic kidney disease: Secondary | ICD-10-CM | POA: Diagnosis not present

## 2017-11-18 DIAGNOSIS — Z87891 Personal history of nicotine dependence: Secondary | ICD-10-CM | POA: Diagnosis not present

## 2017-11-18 DIAGNOSIS — N1832 Chronic kidney disease, stage 3b: Secondary | ICD-10-CM | POA: Diagnosis present

## 2017-11-18 DIAGNOSIS — R29818 Other symptoms and signs involving the nervous system: Secondary | ICD-10-CM | POA: Diagnosis not present

## 2017-11-18 DIAGNOSIS — Z7984 Long term (current) use of oral hypoglycemic drugs: Secondary | ICD-10-CM | POA: Diagnosis not present

## 2017-11-18 DIAGNOSIS — N289 Disorder of kidney and ureter, unspecified: Secondary | ICD-10-CM

## 2017-11-18 DIAGNOSIS — I10 Essential (primary) hypertension: Secondary | ICD-10-CM | POA: Diagnosis present

## 2017-11-18 MED ORDER — ASPIRIN 81 MG PO CHEW: 324.0000 mg | CHEWABLE_TABLET | Freq: Once | ORAL | Status: DC

## 2017-11-18 NOTE — ED Provider Notes (Signed)
[facility] EMERGENCY DEPARTMENT Provider Note   CSN: [id number] Arrival date & time: 11/18/17  2240     History   Chief Complaint Chief Complaint  Patient presents with  . Fall    HPI Jorge [name] is a 72 y.o. male.  The history is provided by the patient, a relative and the spouse.  He has history of diabetes, hypertension, hyperlipidemia, GERD and comes in after having an episode of having fallen at home.  He was getting out of the shower and noted that he was feeling dizzy.  He is unable to describe the dizzy feeling.  He fell down into the tub.  He denies injury from the fall, but he did land on his right shoulder.  His son came to get him up.  He states his arm was numb when he got out of the tub but he thinks it was because of the way he was lying.  His son noted that his arm seemed to be just hanging there and that his face seemed to be crooked and that his speech was somewhat slurred.  Speech was slurred primarily with the first word he would speak and then would improve as he continued to speak.  His wife also noted that his speech was slurred.  His son also noted that his legs seem to give out when he tried to stand him up, but cannot remember which leg was giving out.  Abnormal speech lasted about 20 minutes before resolving completely.  The facial crookedness also resolved.  He denies any headache, chest pain, palpitations.  He feels like he is back to normal.  Of note, he was seen by his PCP yesterday for a respiratory infection and was given a prescription for azithromycin and Tussionex and was also given a steroid injection.  Past Medical History:  Diagnosis Date  . Diabetes mellitus without complication   . Environmental allergies    Hx: of  . GERD (gastroesophageal reflux disease)   . Hypertension   . Numbness and tingling    Hx; of  Right shoulder    There are no active problems to display for this patient.   Past Surgical History:  Procedure  Laterality Date  . ANTERIOR CERVICAL DECOMP/DISCECTOMY FUSION N/A 03/17/2013   Procedure: ANTERIOR CERVICAL DECOMPRESSION/DISCECTOMY FUSION 2 LEVEL C5-7;  Surgeon: [name], MD;  Location: [facility];  Service: Orthopedics;  Laterality: N/A;  . COLONOSCOPY     Hx: of  . HERNIA REPAIR    . TONSILLECTOMY         Home Medications    Prior to Admission medications   Medication Sig Start Date End Date Taking? Authorizing Provider  carvedilol (COREG) 12.5 MG tablet Take 12.5 mg by mouth 2 (two) times daily with a meal.    [provider]  Cetirizine HCl (ZYRTEC PO) Take 1 tablet by mouth daily as needed (allergies).    [provider]  docusate sodium (COLACE) 100 MG capsule Take 1 capsule (100 mg total) by mouth 3 (three) times daily as needed for constipation. 03/18/13   [name], MD  HYDROcodone-acetaminophen [facility]) 10-325 MG per tablet Take 1 tablet by mouth every 6 (six) hours as needed for pain. 03/18/13   [name], MD  linagliptin (TRADJENTA) 5 MG TABS tablet Take 5 mg by mouth daily with breakfast.    [provider]  losartan (COZAAR) 50 MG tablet Take 50 mg by mouth daily with breakfast.    [provider]  metFORMIN (GLUCOPHAGE-XR) 500 MG 24 hr tablet Take 500 mg by mouth 2 (two) times daily with a meal.    [provider]  methocarbamol (ROBAXIN) 500 MG tablet Take 1 tablet (500 mg total) by mouth 3 (three) times daily as needed. 03/18/13   [name], MD  ondansetron (ZOFRAN) 4 MG tablet Take 1 tablet (4 mg total) by mouth every 8 (eight) hours as needed for nausea. 03/18/13   [name], MD  polyethylene glycol powder (GLYCOLAX) powder Take 17 g by mouth daily. 03/18/13   [name], MD  simvastatin (ZOCOR) 20 MG tablet Take 20 mg by mouth at bedtime.    [provider]    Family History Family History  Problem Relation Age of Onset  . Diabetes Brother     Social History Social History   Tobacco Use   . Smoking status: Former Smoker    Types: Cigars  . Smokeless tobacco: Never Used  Substance Use Topics  . Alcohol use: No  . Drug use: No     Allergies   Patient has no known allergies.   Review of Systems Review of Systems  All other systems reviewed and are negative.    Physical Exam Updated Vital Signs BP (!) 195/91 (BP Location: Right Arm)   Pulse 86   Temp 98.4 F (36.9 C) (Oral)   Resp 16   SpO2 96%   Physical Exam  Nursing note and vitals reviewed.  72 year old male, resting comfortably and in no acute distress. Vital signs are significant for elevated blood pressure. Oxygen saturation is 96%, which is normal. Head is normocephalic and atraumatic. PERRLA, EOMI -but generally poor cooperation for EOM exam. Oropharynx is clear. Neck is nontender and supple without adenopathy or JVD.  There are no carotid bruits. Back is nontender and there is no CVA tenderness. Lungs are clear without rales, wheezes, or rhonchi. Chest is nontender. Heart has regular rate and rhythm without murmur. Abdomen is soft, flat, nontender without masses or hepatosplenomegaly and peristalsis is normoactive. Extremities have no cyanosis or edema, full range of motion is present. Skin is warm and dry without rash. Neurologic: Mental status is normal, cranial nerves are intact, there are no motor or sensory deficits.  No pronator drift.  Speech is normal and fluent.  No dizziness elicited on passive head movement.  ED Treatments / Results  Labs (all labs ordered are listed, but only abnormal results are displayed) Labs Reviewed  CBC - Abnormal; Notable for the following components:      Result Value   RBC 4.19 (*)    HCT 38.7 (*)    All other components within normal limits  COMPREHENSIVE METABOLIC PANEL - Abnormal; Notable for the following components:   CO2 21 (*)    Glucose, Bld 145 (*)    Creatinine, Ser 1.56 (*)    GFR calc non Af Amer 43 (*)    GFR calc Af Amer 49 (*)    All  other components within normal limits  URINALYSIS, ROUTINE W REFLEX MICROSCOPIC - Abnormal; Notable for the following components:   Ketones, ur 5 (*)    Protein, ur 30 (*)    All other components within normal limits  ETHANOL  PROTIME-INR  APTT  DIFFERENTIAL  RAPID URINE DRUG SCREEN, HOSP PERFORMED  HEMOGLOBIN A1C  LIPID PANEL  I-STAT TROPONIN, ED    EKG  EKG Interpretation  Date/Time:  Wednesday November 18 2017 22:40:07 EDT Ventricular Rate:  72 PR Interval:  158 QRS Duration: 80 QT Interval:  368 QTC Calculation: 402 R Axis:   37 Text Interpretation:  Sinus rhythm with marked sinus arrhythmia Otherwise normal ECG No old tracing to compare Confirmed by Delora Fuel ([id number]) on 11/18/2017 11:03:35 PM       Radiology Ct Head Wo Contrast  Result Date: 11/19/2017 CLINICAL DATA:  72 year old male with TIA. EXAM: CT HEAD WITHOUT CONTRAST TECHNIQUE: Contiguous axial images were obtained from the base of the skull through the vertex without intravenous contrast. COMPARISON:  Head CT dated 04/05/2004 FINDINGS: Brain: The ventricles and sulci appropriate size for patient's age. Mild periventricular and deep white matter chronic microvascular ischemic changes noted. There is no acute intracranial hemorrhage. No mass effect or midline shift. No extra-axial fluid collection. Vascular: No hyperdense vessel or unexpected calcification. Skull: Normal. Negative for fracture or focal lesion. Sinuses/Orbits: There is diffuse mucoperiosteal thickening of paranasal sinuses. The mastoid air cells are clear. Other: None IMPRESSION: 1. No acute intracranial hemorrhage. 2. Mild age-related atrophy and chronic microvascular ischemic changes. Electronically Signed   By: Anner Crete M.D.   On: 11/19/2017 00:16   Dg Chest Port 1 View  Result Date: 11/19/2017 CLINICAL DATA:  72 year old male with shortness of breath. EXAM: PORTABLE CHEST 1 VIEW COMPARISON:  Chest radiograph dated 03/16/2013 FINDINGS:  Minimal left lung base atelectatic changes. No focal consolidation, pleural effusion, or pneumothorax. The cardiac silhouette is within normal limits. No acute osseous pathology. IMPRESSION: No active disease. Electronically Signed   By: Anner Crete M.D.   On: 11/19/2017 02:17    Procedures Procedures (including critical care time)  Medications Ordered in ED Medications  aspirin chewable tablet 324 mg (not administered)  HYDROcodone-acetaminophen (NORCO) 10-325 MG per tablet 1-2 tablet (not administered)  methocarbamol (ROBAXIN) tablet 500 mg (not administered)  simvastatin (ZOCOR) tablet 20 mg (not administered)   stroke: mapping our early stages of recovery book (not administered)  0.9 %  sodium chloride infusion (not administered)  acetaminophen (TYLENOL) tablet 650 mg (not administered)    Or  acetaminophen (TYLENOL) solution 650 mg (not administered)    Or  acetaminophen (TYLENOL) suppository 650 mg (not administered)  senna-docusate (Senokot-S) tablet 1 tablet (not administered)  heparin injection 5,000 Units (not administered)  aspirin suppository 300 mg (not administered)    Or  aspirin tablet 325 mg (not administered)  insulin aspart (novoLOG) injection 0-9 Units (not administered)  insulin aspart (novoLOG) injection 0-5 Units (not administered)     Initial Impression / Assessment and Plan / ED Course  I have reviewed the triage vital signs and the nursing notes.  Pertinent labs & imaging results that were available during my care of the patient were reviewed by me and considered in my medical decision making (see chart for details).  Loss of balance with transient aphasia and facial droop and arm weakness and numbness with normal neurologic exam now.  This clinically appears to be a transient ischemic attack.  He does have significant risk factors for atherosclerosis including diabetes, hypertension, hyperlipidemia.  Of note, he does take low-dose aspirin daily.  No  neurologic abnormalities on exam currently.  He is being sent for CT of head, screening labs obtained, and will need admission for TIA workup.  Old records are reviewed confirming office visit yesterday for respiratory tract infection treated with azithromycin, Tussionex, and injection of Kenalog.  12:11 AM Family relates that he had another transient episode of slurred speech on return from CT scan.  I have reviewed CT  images and did not see any acute process, but radiology interpretation is pending.  Radiology interpretation appreciated.  No acute process seen on CT of head.  MRI of brain and MRA of head have been ordered.  Case is discussed with Dr. Myna Hidalgo of [facility] hospitalist who agrees to admit the patient.  Also, of note, mild renal insufficiency is present.  Per care everywhere, this is slightly higher than last recorded creatinine about 1 year ago.  Final Clinical Impressions(s) / ED Diagnoses   Final diagnoses:  TIA (transient ischemic attack)  Renal insufficiency    ED Discharge Orders    None       Delora Fuel, MD 78/67/67 [phone]

## 2017-11-18 NOTE — ED Notes (Addendum)
Patient transported to CT 

## 2017-11-18 NOTE — ED Triage Notes (Signed)
EMS from home was taking a shower and got dizzy and sat down on the side of the tub and then fall back. Pt was reportedly unsteady on his feet when trying to stand up.

## 2017-11-19 ENCOUNTER — Encounter (HOSPITAL_COMMUNITY): Payer: Self-pay | Admitting: Family Medicine

## 2017-11-19 ENCOUNTER — Observation Stay (HOSPITAL_COMMUNITY): Payer: Medicare Other

## 2017-11-19 ENCOUNTER — Emergency Department (HOSPITAL_COMMUNITY): Payer: Medicare Other

## 2017-11-19 DIAGNOSIS — G8929 Other chronic pain: Secondary | ICD-10-CM

## 2017-11-19 DIAGNOSIS — R29818 Other symptoms and signs involving the nervous system: Secondary | ICD-10-CM

## 2017-11-19 DIAGNOSIS — E119 Type 2 diabetes mellitus without complications: Secondary | ICD-10-CM | POA: Diagnosis not present

## 2017-11-19 DIAGNOSIS — N183 Chronic kidney disease, stage 3 unspecified: Secondary | ICD-10-CM | POA: Diagnosis present

## 2017-11-19 DIAGNOSIS — N1831 Chronic kidney disease, stage 3a: Secondary | ICD-10-CM | POA: Diagnosis present

## 2017-11-19 DIAGNOSIS — G459 Transient cerebral ischemic attack, unspecified: Secondary | ICD-10-CM

## 2017-11-19 DIAGNOSIS — I10 Essential (primary) hypertension: Secondary | ICD-10-CM

## 2017-11-19 DIAGNOSIS — N1832 Chronic kidney disease, stage 3b: Secondary | ICD-10-CM | POA: Diagnosis present

## 2017-11-19 LAB — URINALYSIS, ROUTINE W REFLEX MICROSCOPIC
BACTERIA UA: NONE SEEN
BILIRUBIN URINE: NEGATIVE
GLUCOSE, UA: NEGATIVE mg/dL
HGB URINE DIPSTICK: NEGATIVE
KETONES UR: 5 mg/dL — AB
Leukocytes, UA: NEGATIVE
NITRITE: NEGATIVE
Protein, ur: 30 mg/dL — AB
Specific Gravity, Urine: 1.018 (ref 1.005–1.030)
Squamous Epithelial / LPF: NONE SEEN
pH: 5 (ref 5.0–8.0)

## 2017-11-19 LAB — CBC
HEMATOCRIT: 38.7 % — AB (ref 39.0–52.0)
Hemoglobin: 13.2 g/dL (ref 13.0–17.0)
MCH: 31.5 pg (ref 26.0–34.0)
MCHC: 34.1 g/dL (ref 30.0–36.0)
MCV: 92.4 fL (ref 78.0–100.0)
PLATELETS: 181 10*3/uL (ref 150–400)
RBC: 4.19 MIL/uL — ABNORMAL LOW (ref 4.22–5.81)
RDW: 12.9 % (ref 11.5–15.5)
WBC: 8 10*3/uL (ref 4.0–10.5)

## 2017-11-19 LAB — PROTIME-INR
INR: 1.03
PROTHROMBIN TIME: 13.4 s (ref 11.4–15.2)

## 2017-11-19 LAB — DIFFERENTIAL
BASOS PCT: 0 %
Basophils Absolute: 0 10*3/uL (ref 0.0–0.1)
EOS PCT: 1 %
Eosinophils Absolute: 0.1 10*3/uL (ref 0.0–0.7)
Lymphocytes Relative: 21 %
Lymphs Abs: 1.7 10*3/uL (ref 0.7–4.0)
MONO ABS: 0.7 10*3/uL (ref 0.1–1.0)
MONOS PCT: 8 %
Neutro Abs: 5.6 10*3/uL (ref 1.7–7.7)
Neutrophils Relative %: 70 %

## 2017-11-19 LAB — LIPID PANEL
CHOL/HDL RATIO: 4.8 ratio
Cholesterol: 174 mg/dL (ref 0–200)
HDL: 36 mg/dL — AB (ref >40)
LDL CALC: 114 mg/dL — AB (ref 0–99)
TRIGLYCERIDES: 122 mg/dL (ref <150)
VLDL: 24 mg/dL (ref 0–40)

## 2017-11-19 LAB — COMPREHENSIVE METABOLIC PANEL
ALK PHOS: 83 U/L (ref 38–126)
ALT: 25 U/L (ref 17–63)
ANION GAP: 10 (ref 5–15)
AST: 29 U/L (ref 15–41)
Albumin: 3.8 g/dL (ref 3.5–5.0)
BUN: 20 mg/dL (ref 6–20)
CALCIUM: 8.9 mg/dL (ref 8.9–10.3)
CHLORIDE: 105 mmol/L (ref 101–111)
CO2: 21 mmol/L — ABNORMAL LOW (ref 22–32)
CREATININE: 1.56 mg/dL — AB (ref 0.61–1.24)
GFR, EST AFRICAN AMERICAN: 49 mL/min — AB (ref >60)
GFR, EST NON AFRICAN AMERICAN: 43 mL/min — AB (ref >60)
Glucose, Bld: 145 mg/dL — ABNORMAL HIGH (ref 65–99)
Potassium: 4 mmol/L (ref 3.5–5.1)
SODIUM: 136 mmol/L (ref 135–145)
Total Bilirubin: 1 mg/dL (ref 0.3–1.2)
Total Protein: 7.4 g/dL (ref 6.5–8.1)

## 2017-11-19 LAB — APTT: aPTT: 31 seconds (ref 24–36)

## 2017-11-19 LAB — RAPID URINE DRUG SCREEN, HOSP PERFORMED
Amphetamines: NOT DETECTED
Barbiturates: NOT DETECTED
Benzodiazepines: NOT DETECTED
Cocaine: NOT DETECTED
OPIATES: POSITIVE — AB
Tetrahydrocannabinol: NOT DETECTED

## 2017-11-19 LAB — I-STAT TROPONIN, ED: TROPONIN I, POC: 0 ng/mL (ref 0.00–0.08)

## 2017-11-19 LAB — GLUCOSE, CAPILLARY
GLUCOSE-CAPILLARY: 158 mg/dL — AB (ref 65–99)
Glucose-Capillary: 161 mg/dL — ABNORMAL HIGH (ref 65–99)
Glucose-Capillary: 131 mg/dL — ABNORMAL HIGH (ref 65–99)
Glucose-Capillary: 184 mg/dL — ABNORMAL HIGH (ref 65–99)
Glucose-Capillary: 457 mg/dL — ABNORMAL HIGH (ref 65–99)

## 2017-11-19 LAB — HEMOGLOBIN A1C
Hgb A1c MFr Bld: 6.6 % — ABNORMAL HIGH (ref 4.8–5.6)
Mean Plasma Glucose: 142.72 mg/dL

## 2017-11-19 LAB — ETHANOL

## 2017-11-19 MED ORDER — SIMVASTATIN 20 MG PO TABS
20.0000 mg | ORAL_TABLET | Freq: Every day | ORAL | Status: DC
  Administered 2017-11-19: 20 mg via ORAL
  Filled 2017-11-19: qty 1

## 2017-11-19 MED ORDER — STROKE: EARLY STAGES OF RECOVERY BOOK
Freq: Once | Status: AC
  Administered 2017-11-19: 1
  Filled 2017-11-19: qty 1

## 2017-11-19 MED ORDER — HYDRALAZINE HCL 20 MG/ML IJ SOLN: 10.0000 mg | Freq: Four times a day (QID) | INTRAMUSCULAR | Status: DC | PRN

## 2017-11-19 MED ORDER — LEVETIRACETAM 500 MG PO TABS
500.0000 mg | ORAL_TABLET | Freq: Two times a day (BID) | ORAL | Status: DC
  Administered 2017-11-19 (×2): 500 mg via ORAL
  Filled 2017-11-19 (×3): qty 1

## 2017-11-19 MED ORDER — ACETAMINOPHEN 650 MG RE SUPP: 650.0000 mg | RECTAL | Status: DC | PRN

## 2017-11-19 MED ORDER — LORAZEPAM 2 MG/ML IJ SOLN: 0.5000 mg | INTRAMUSCULAR | Status: DC | PRN

## 2017-11-19 MED ORDER — SENNOSIDES-DOCUSATE SODIUM 8.6-50 MG PO TABS: 1.0000 | ORAL_TABLET | Freq: Every evening | ORAL | Status: DC | PRN

## 2017-11-19 MED ORDER — ASPIRIN 300 MG RE SUPP: 300.0000 mg | Freq: Every day | RECTAL | Status: DC

## 2017-11-19 MED ORDER — ACETAMINOPHEN 160 MG/5ML PO SOLN: 650.0000 mg | ORAL | Status: DC | PRN

## 2017-11-19 MED ORDER — FLUTICASONE PROPIONATE 50 MCG/ACT NA SUSP
2.0000 | Freq: Every day | NASAL | Status: DC
  Administered 2017-11-20: 2 via NASAL
  Filled 2017-11-19: qty 16

## 2017-11-19 MED ORDER — ACETAMINOPHEN 325 MG PO TABS: 650.0000 mg | ORAL_TABLET | ORAL | Status: DC | PRN

## 2017-11-19 MED ORDER — SODIUM CHLORIDE 0.9 % IV SOLN
INTRAVENOUS | Status: DC
  Administered 2017-11-19 (×2): via INTRAVENOUS

## 2017-11-19 MED ORDER — LORATADINE 10 MG PO TABS
10.0000 mg | ORAL_TABLET | Freq: Every day | ORAL | Status: DC
  Administered 2017-11-19 – 2017-11-20 (×2): 10 mg via ORAL
  Filled 2017-11-19 (×2): qty 1

## 2017-11-19 MED ORDER — HYDROCODONE-ACETAMINOPHEN 10-325 MG PO TABS: 1.0000 | ORAL_TABLET | Freq: Four times a day (QID) | ORAL | Status: DC | PRN

## 2017-11-19 MED ORDER — INSULIN ASPART 100 UNIT/ML ~~LOC~~ SOLN: 0.0000 [IU] | Freq: Every day | SUBCUTANEOUS | Status: DC

## 2017-11-19 MED ORDER — GUAIFENESIN-DM 100-10 MG/5ML PO SYRP
5.0000 mL | ORAL_SOLUTION | ORAL | Status: DC | PRN
  Administered 2017-11-19 – 2017-11-20 (×2): 5 mL via ORAL
  Filled 2017-11-19 (×2): qty 5

## 2017-11-19 MED ORDER — INSULIN ASPART 100 UNIT/ML ~~LOC~~ SOLN
0.0000 [IU] | Freq: Three times a day (TID) | SUBCUTANEOUS | Status: DC
  Administered 2017-11-19 (×2): 2 [IU] via SUBCUTANEOUS
  Administered 2017-11-19: 1 [IU] via SUBCUTANEOUS
  Administered 2017-11-20: 2 [IU] via SUBCUTANEOUS
  Administered 2017-11-20: 1 [IU] via SUBCUTANEOUS

## 2017-11-19 MED ORDER — METHOCARBAMOL 500 MG PO TABS: 500.0000 mg | ORAL_TABLET | Freq: Three times a day (TID) | ORAL | Status: DC | PRN

## 2017-11-19 MED ORDER — HEPARIN SODIUM (PORCINE) 5000 UNIT/ML IJ SOLN
5000.0000 [IU] | Freq: Three times a day (TID) | INTRAMUSCULAR | Status: DC
  Administered 2017-11-19 – 2017-11-20 (×5): 5000 [IU] via SUBCUTANEOUS
  Filled 2017-11-19 (×5): qty 1

## 2017-11-19 MED ORDER — CARVEDILOL 12.5 MG PO TABS
12.5000 mg | ORAL_TABLET | Freq: Two times a day (BID) | ORAL | Status: DC
  Administered 2017-11-19 – 2017-11-20 (×2): 12.5 mg via ORAL
  Filled 2017-11-19 (×2): qty 1

## 2017-11-19 MED ORDER — SODIUM CHLORIDE 0.9 % IV SOLN
INTRAVENOUS | Status: AC
  Administered 2017-11-19: 980 mL via INTRAVENOUS

## 2017-11-19 MED ORDER — ASPIRIN 325 MG PO TABS
325.0000 mg | ORAL_TABLET | Freq: Every day | ORAL | Status: DC
  Administered 2017-11-19 – 2017-11-20 (×2): 325 mg via ORAL
  Filled 2017-11-19 (×2): qty 1

## 2017-11-19 NOTE — Care Management Note (Signed)
Case Management Note  Patient Details  Name: Ren Aspinall MRN: [id number] Date of Birth: 1946/08/03  Subjective/Objective:   Pt in with TIA. He is from home with spouse. Hx:  type 2 diabetes mellitus, hypertension, chronic kidney disease stage III, and chronic hip pain PCP: Dr Bess Harvest                 Action/Plan: No f/u per PT. Awaiting OT eval. CM following for d/c needs, physician orders.   Expected Discharge Date:                  Expected Discharge Plan:  Home/Self Care  In-House Referral:     Discharge planning Services     Post Acute Care Choice:    Choice offered to:     DME Arranged:    DME Agency:     HH Arranged:    HH Agency:     Status of Service:  In process, will continue to follow  If discussed at Long Length of Stay Meetings, dates discussed:    Additional Comments:  Pollie Friar, RN 11/19/2017, 11:38 AM

## 2017-11-19 NOTE — H&P (Signed)
History and Physical    Jorge [name] HQI:[id number] DOB: 1946-06-26 DOA: 11/18/2017  PCP: Mayer Camel, NP   Patient coming from: Home  Chief Complaint: Transient slurred speech, right arm weakness, right facial droop  HPI: Jorge [name] is a 72 y.o. male with medical history significant for type 2 diabetes mellitus, hypertension, chronic kidney disease stage III, and chronic hip pain, now presenting to the emergency department after an episode at home marked by transient dysarthria, right arm weakness, and unsteadiness on his feet.  The patient had been suffering from nasal congestion and upper respiratory symptoms for a few days, saw his PCP on 11/17/2017, was diagnosed with bronchitis, given a Kenalog injection in the clinic and prescribed azithromycin and Tussionex.  Patient continued to do fairly well until tonight while in the shower, when he developed lightheadedness and felt off balance.  He sat down on the side of the tub, but fell backwards.  He had trouble raising his right arm, was noted to have slurred speech by family, and they also reported a right facial droop.  Patient was transported to the hospital and reports complete resolution of his symptoms prior to arrival.  He had never experienced similar symptoms previously, denies chest pain or palpitations, denies headache or change in vision or hearing.   ED Course: Upon arrival to the ED, patient is found to be afebrile, saturating well on room air, hypertensive to 175/85, and vitals otherwise normal.  EKG features a sinus rhythm with sinus arrhythmia and noncontrast head CT is negative for acute intracranial abnormality.  Chemistry panel features a creatinine of 1.56, up from priors in the 1.2 range.  CBC is unremarkable, ethanol level is undetectable, troponin and INR are within normal limits.  While returning from the CT head, patient had a recurrent episode of dysarthria and right arm weakness that completely resolved within  a couple minutes.  He was treated with aspirin 324 mg in the ED.  Remains hemodynamically stable, in no apparent respiratory distress, and will be observed on the telemetry unit for ongoing evaluation and management of transient neurologic deficits concerning for TIA/CVA.  Review of Systems:  All other systems reviewed and apart from HPI, are negative.  Past Medical History:  Diagnosis Date  . Diabetes mellitus without complication ([place])   . Environmental allergies    Hx: of  . GERD (gastroesophageal reflux disease)   . Hypertension   . Numbness and tingling    Hx; of  Right shoulder    Past Surgical History:  Procedure Laterality Date  . ANTERIOR CERVICAL DECOMP/DISCECTOMY FUSION N/A 03/17/2013   Procedure: ANTERIOR CERVICAL DECOMPRESSION/DISCECTOMY FUSION 2 LEVEL C5-7;  Surgeon: [name], MD;  Location: [place];  Service: Orthopedics;  Laterality: N/A;  . COLONOSCOPY     Hx: of  . HERNIA REPAIR    . TONSILLECTOMY       reports that he has quit smoking. His smoking use included cigars. he has never used smokeless tobacco. He reports that he does not drink alcohol or use drugs.  No Known Allergies  Family History  Problem Relation Age of Onset  . Diabetes Brother      Prior to Admission medications   Medication Sig Start Date End Date Taking? Authorizing Provider  carvedilol (COREG) 12.5 MG tablet Take 12.5 mg by mouth 2 (two) times daily with a meal.   Yes [provider]  Cetirizine HCl (ZYRTEC PO) Take 1 tablet by mouth daily as needed (allergies).   Yes  [provider]  Cyanocobalamin (VITAMIN B-12 IJ) Inject 1 Applicatorful as directed every 30 (thirty) days.   Yes [provider]  docusate sodium (COLACE) 100 MG capsule Take 1 capsule (100 mg total) by mouth 3 (three) times daily as needed for constipation. 03/18/13  Yes [name], MD  glipiZIDE (GLUCOTROL XL) 10 MG 24 hr tablet Take 10 mg by mouth daily with breakfast.   Yes [provider]  HYDROcodone-acetaminophen (NORCO) 10-325 MG per tablet Take 1 tablet by mouth every 6 (six) hours as needed for pain. 03/18/13  Yes [name], MD  linagliptin (TRADJENTA) 5 MG TABS tablet Take 5 mg by mouth daily with breakfast.   Yes [provider]  losartan (COZAAR) 50 MG tablet Take 50 mg by mouth daily with breakfast.   Yes [provider]  metFORMIN (GLUCOPHAGE-XR) 500 MG 24 hr tablet Take 500 mg by mouth 2 (two) times daily with a meal.   Yes [provider]  methocarbamol (ROBAXIN) 500 MG tablet Take 1 tablet (500 mg total) by mouth 3 (three) times daily as needed. Patient taking differently: Take 500 mg by mouth 3 (three) times daily as needed for muscle spasms.  03/18/13  Yes [name], MD  ondansetron (ZOFRAN) 4 MG tablet Take 1 tablet (4 mg total) by mouth every 8 (eight) hours as needed for nausea. 03/18/13  Yes [name], MD  pioglitazone (ACTOS) 30 MG tablet Take 30 mg by mouth daily.   Yes [provider]  simvastatin (ZOCOR) 20 MG tablet Take 20 mg by mouth at bedtime.   Yes [provider]    Physical Exam: Vitals:   11/18/17 2255 11/19/17 0000 11/19/17 0100 11/19/17 0130  BP: (!) 195/91 (!) 174/91 (!) 175/84 (!) 154/98  Pulse: 86 75 81 77  Resp: 16 18 (!) 24 (!) 22  Temp: 98.4 F (36.9 C)     TempSrc: Oral     SpO2: 96% 95% 95% 95%      Constitutional: NAD, calm  Eyes: PERTLA, lids and conjunctivae normal ENMT: Mucous membranes are moist. Posterior pharynx clear of any exudate or lesions.   Neck: normal, supple, no masses, no thyromegaly Respiratory: clear to auscultation bilaterally, no wheezing, no crackles. Normal respiratory effort.    Cardiovascular: S1 & S2 heard, regular rate and rhythm. No extremity edema.   Abdomen: No distension, no tenderness, no masses palpated. Bowel sounds normal.  Musculoskeletal: no clubbing / cyanosis. No joint deformity upper and lower extremities.   Skin:  no significant rashes, lesions, ulcers. Warm, dry, well-perfused. Neurologic: CN 2-12 grossly intact. Sensation to light touch intact. Strength 5/5 in all 4 limbs.  Psychiatric:  Alert and oriented x 3. Calm, cooperative.     Labs on Admission: I have personally reviewed following labs and imaging studies  CBC: Recent Labs  Lab 11/18/17 2358  WBC 8.0  NEUTROABS 5.6  HGB 13.2  HCT 38.7*  MCV 92.4  PLT 086   Basic Metabolic Panel: Recent Labs  Lab 11/18/17 2358  NA 136  K 4.0  CL 105  CO2 21*  GLUCOSE 145*  BUN 20  CREATININE 1.56*  CALCIUM 8.9   GFR: CrCl cannot be calculated (Unknown ideal weight.). Liver Function Tests: Recent Labs  Lab 11/18/17 2358  AST 29  ALT 25  ALKPHOS 83  BILITOT 1.0  PROT 7.4  ALBUMIN 3.8   No results for input(s): LIPASE, AMYLASE in the last 168 hours. No results for input(s): AMMONIA in  the last 168 hours. Coagulation Profile: Recent Labs  Lab 11/18/17 2358  INR 1.03   Cardiac Enzymes: No results for input(s): CKTOTAL, CKMB, CKMBINDEX, TROPONINI in the last 168 hours. BNP (last 3 results) No results for input(s): PROBNP in the last 8760 hours. HbA1C: No results for input(s): HGBA1C in the last 72 hours. CBG: No results for input(s): GLUCAP in the last 168 hours. Lipid Profile: No results for input(s): CHOL, HDL, LDLCALC, TRIG, CHOLHDL, LDLDIRECT in the last 72 hours. Thyroid Function Tests: No results for input(s): TSH, T4TOTAL, FREET4, T3FREE, THYROIDAB in the last 72 hours. Anemia Panel: No results for input(s): VITAMINB12, FOLATE, FERRITIN, TIBC, IRON, RETICCTPCT in the last 72 hours. Urine analysis: No results found for: COLORURINE, APPEARANCEUR, LABSPEC, PHURINE, GLUCOSEU, HGBUR, BILIRUBINUR, KETONESUR, PROTEINUR, UROBILINOGEN, NITRITE, LEUKOCYTESUR Sepsis Labs: @LABRCNTIP (procalcitonin:4,lacticidven:4) )No results found for this or any previous visit (from the past 240 hour(s)).   Radiological Exams on  Admission: Ct Head Wo Contrast  Result Date: 11/19/2017 CLINICAL DATA:  72 year old male with TIA. EXAM: CT HEAD WITHOUT CONTRAST TECHNIQUE: Contiguous axial images were obtained from the base of the skull through the vertex without intravenous contrast. COMPARISON:  Head CT dated 04/05/2004 FINDINGS: Brain: The ventricles and sulci appropriate size for patient's age. Mild periventricular and deep white matter chronic microvascular ischemic changes noted. There is no acute intracranial hemorrhage. No mass effect or midline shift. No extra-axial fluid collection. Vascular: No hyperdense vessel or unexpected calcification. Skull: Normal. Negative for fracture or focal lesion. Sinuses/Orbits: There is diffuse mucoperiosteal thickening of paranasal sinuses. The mastoid air cells are clear. Other: None IMPRESSION: 1. No acute intracranial hemorrhage. 2. Mild age-related atrophy and chronic microvascular ischemic changes. Electronically Signed   By: Anner Crete M.D.   On: 11/19/2017 00:16    EKG: Independently reviewed. Sinus rhythm with sinus arrhythmia.   Assessment/Plan  1. Transient neurologic deficits  - Presents following an episode at home marked by dysarthria, right arm weakness, right facial droop, and unsteadiness on feet; sxs completely resolved before a brief recurrence in ED  - Head CT is negative for acute findings and no focal neurologic deficit appreciated at time of admission  - Treated with full-dose ASA in ED  - Continue cardiac monitoring, frequent neuro checks, PT/OT/SLP evals  - Check MRI brain, MRA head & neck, echocardiogram, fasting lipids and A1c  - Continue ASA and statin   2. Hypertension  - BP mildly elevated in ED  - Permit HTN while evaluating for acute TIA/CVA   3. CKD stage III  - SCr is 1.56 on admission, up from priors in 1.2 range  - Renally-dose medications, provide gentle IVF hydration, avoid nephrotoxins, repeat chem panel in am    4. Chronic pain  -  Attributed to remote hip injury  - Continue home regimen with prn Norco and Robaxin    5. Type II DM  - A1c was 8.4% remotely  - Managed at home with glipizide, Tradjenta, metformin, and pioglitazone - Follow CBG's and use SSI with Novolog while in hospital    DVT prophylaxis: sq heparin  Code Status: Full  Family Communication: Son updated at bedside Consults called: Neurology  Admission status: Observation    Vianne Bulls, MD [facility] Pager [phone]  If 7PM-7AM, please contact night-coverage [email or web address] Password [email or web address]  11/19/2017, 2:04 AM

## 2017-11-19 NOTE — Evaluation (Signed)
Occupational Therapy Evaluation Patient Details Name: Jorge [name] MRN: [id number] DOB: 30-Aug-1946 Today's Date: 11/19/2017    History of Present Illness Jorge [name] is a 72yo white male who [facility] on 3/14 after a transient episode of dysarthira, RUE weakness, gait instability, all resolved PTA. PMH: DM2, HTN, CKD3, chronic hip pain s/p injection and PT, 2014 ACDF C5-7 s/p RUE pain and numbness.    Clinical Impression   Pt is at independent baseline level of function, no vision or UE ROM/ccordination impairments. All education completed and no further acute OT is indicated at this time    Follow Up Recommendations  No OT follow up    Equipment Recommendations  None recommended by OT    Recommendations for Other Services       Precautions / Restrictions Precautions Precautions: None Restrictions Weight Bearing Restrictions: No      Mobility Bed Mobility Overal bed mobility: Modified Independent             General bed mobility comments: slow, guarded movement  Transfers Overall transfer level: Independent                    Balance Overall balance assessment: Independent;No apparent balance deficits (not formally assessed)                                         ADL either performed or assessed with clinical judgement   ADL Overall ADL's : Independent;At baseline                                             Vision Baseline Vision/History: Wears glasses Wears Glasses: Reading only Patient Visual Report: No change from baseline       Perception     Praxis      Pertinent Vitals/Pain Pain Assessment: No/denies pain     Hand Dominance Right   Extremity/Trunk Assessment Upper Extremity Assessment Upper Extremity Assessment: Overall WFL for tasks assessed   Lower Extremity Assessment Lower Extremity Assessment: Defer to PT evaluation   Cervical / Trunk Assessment Cervical / Trunk Assessment: Normal    Communication Communication Communication: No difficulties   Cognition Arousal/Alertness: Awake/alert Behavior During Therapy: WFL for tasks assessed/performed Overall Cognitive Status: Within Functional Limits for tasks assessed                                     General Comments       Exercises     Shoulder Instructions      Home Living Family/patient expects to be discharged to:: Private residence Living Arrangements: Spouse/significant other Available Help at Discharge: Family;Available 24 hours/day Type of Home: House Home Access: Stairs to enter [employer] of Steps: 1 Entrance Stairs-Rails: None Home Layout: One level     Bathroom Shower/Tub: Tub/shower unit;Walk-in shower     Bathroom Accessibility: Yes   Home Equipment: Walker - 2 wheels;Cane - single point   Additional Comments: no prior A/E use PTA. No gait instability, no falls history.       Prior Functioning/Environment Level of Independence: Independent                 OT Problem List:  OT Treatment/Interventions:      OT Goals(Current goals can be found in the care plan section) Acute Rehab OT Goals Patient Stated Goal: go home OT Goal Formulation: With patient/family  OT Frequency:     Barriers to D/C:    no barriers       Co-evaluation              AM-PAC PT "6 Clicks" Daily Activity     Outcome Measure Help from another person eating meals?: None Help from another person taking care of personal grooming?: None Help from another person toileting, which includes using toliet, bedpan, or urinal?: None Help from another person bathing (including washing, rinsing, drying)?: None Help from another person to put on and taking off regular upper body clothing?: None Help from another person to put on and taking off regular lower body clothing?: None 6 Click Score: 24   End of Session Equipment Utilized During Treatment: Gait belt  Activity  Tolerance: Patient tolerated treatment well Patient left: in bed;with call bell/phone within reach;with family/visitor present  OT Visit Diagnosis: History of falling (Z91.81)                Time: 4562-5638 OT Time Calculation (min): 18 min Charges:  OT General Charges $OT Visit: 1 Visit OT Evaluation $OT Eval Low Complexity: 1 Low G-Codes: OT G-codes **NOT FOR INPATIENT CLASS** Functional Assessment Tool Used: AM-PAC 6 Clicks Daily Activity     Britt Bottom 11/19/2017, 2:12 PM

## 2017-11-19 NOTE — Progress Notes (Signed)
EEG completed, results pending. 

## 2017-11-19 NOTE — ED Notes (Signed)
Report given to [facility] on [facility]; Pt will be moved to [facility] on arrival back from [facility]

## 2017-11-19 NOTE — ED Notes (Signed)
Patient transported to MRI 

## 2017-11-19 NOTE — Procedures (Signed)
ELECTROENCEPHALOGRAM REPORT  Date of Study: 11/19/2017  Patient's Name: Jorge [name] MRN: [id number] Date of Birth: Oct 04, 1945  Referring Provider: Rosita Fire, MD  Clinical History: 72 year old male with episode of slurred speech and right sided weakness.  Medications: levetiracetam lorazepam aspirin Insulin  Technical Summary: A multichannel digital EEG recording measured by the international 10-20 system with electrodes applied with paste and impedances below 5000 ohms performed as portable with EKG monitoring in an awake and drowsy patient.  Hyperventilation and photic stimulation were not performed.  The digital EEG was referentially recorded, reformatted, and digitally filtered in a variety of bipolar and referential montages for optimal display.   Description: The patient is awake and drowsy during the recording.  During maximal wakefulness, there is a symmetric, low voltage 7 Hz posterior dominant rhythm that attenuates with eye opening. This is admixed with diffuse 4-5 Hz theta and 2-3 Hz delta slowing of the waking background.  Stage 2 sleep is not seen.  There were no epileptiform discharges or electrographic seizures seen.    EKG lead was unremarkable.  Impression: This awake and drowsy EEG is abnormal due to diffuse slowing of the waking background.  Clinical Correlation of the above findings indicates diffuse cerebral dysfunction that is non-specific in etiology and can be seen with hypoxic/ischemic injury, toxic/metabolic encephalopathies, neurodegenerative disorders, or medication effect.  However, it may also be due to excessive drowsiness.  The absence of epileptiform discharges does not rule out a clinical diagnosis of epilepsy.  Clinical correlation is advised.  Metta Clines, DO

## 2017-11-19 NOTE — Consult Note (Signed)
Requesting Physician: Dr. Tamala Julian    Chief Complaint: Slurred speech, right side weakness  History obtained from: Patient and Chart     HPI:                                                                                                                                       Jorge [name] is an 72 y.o. male with past medical history of diabetes, hypertension presents with episode of slurred speech and right-sided weakness after having a fall in the shower.  Patient was taking shower at 9:15 PM last night when he felt lightheaded and sat on the edge of the tub but slipped and hit his head lightly.  There is no loss of consciousness.  After falling the patient had difficulty getting up out of the tub and family noticed that he was not able to raise his right arm and his speech was slurred.  Start improving after about 5 minutes.  When EMS arrived he did still had some difficulty with the speech and still confused.  He had another episode of transient dysarthria and right-sided weakness while undergoing CT scan in the emergency room.  Patient was admitted for TIA workup and neurology was consulted.  Date last known well:.3.13.9 Time last known well: 9:15 PM tPA Given: No,symptoms resolved NIHSS: 0 Baseline MRS 0    Past Medical History:  Diagnosis Date  . Diabetes mellitus without complication ([place])   . Environmental allergies    Hx: of  . GERD (gastroesophageal reflux disease)   . Hypertension   . Numbness and tingling    Hx; of  Right shoulder    Past Surgical History:  Procedure Laterality Date  . ANTERIOR CERVICAL DECOMP/DISCECTOMY FUSION N/A 03/17/2013   Procedure: ANTERIOR CERVICAL DECOMPRESSION/DISCECTOMY FUSION 2 LEVEL C5-7;  Surgeon: [name], MD;  Location: [facility];  Service: Orthopedics;  Laterality: N/A;  . COLONOSCOPY     Hx: of  . HERNIA REPAIR    . TONSILLECTOMY      Family History  Problem Relation Age of Onset  . Diabetes Brother    Social History:  reports that  he has quit smoking. His smoking use included cigars. he has never used smokeless tobacco. He reports that he does not drink alcohol or use drugs.  Allergies: No Known Allergies  Medications:  I reviewed home medications   ROS:                                                                                                                                     14 systems reviewed and negative except above    Examination:                                                                                                      General: Appears well-developed and well-nourished.  Psych: Affect appropriate to situation Eyes: No scleral injection HENT: No OP obstrucion Head: Normocephalic.  Cardiovascular: Normal rate and regular rhythm.  Respiratory: Effort normal and breath sounds normal to anterior ascultation GI: Soft.  No distension. There is no tenderness.  Skin: WDI   Neurological Examination Mental Status: Alert, oriented, thought content appropriate.  Speech fluent without evidence of aphasia. Able to follow 3 step commands without difficulty. Cranial Nerves: II: Visual fields grossly normal,  III,IV, VI: ptosis not present, extra-ocular motions intact bilaterally, pupils equal, round, reactive to light and accommodation V,VII: smile symmetric, facial light touch sensation normal bilaterally VIII: hearing normal bilaterally IX,X: uvula rises symmetrically XI: bilateral shoulder shrug XII: midline tongue extension Motor: Right : Upper extremity   5/5    Left:     Upper extremity   5/5  Lower extremity   5/5     Lower extremity   5/5 Tone and bulk:normal tone throughout; no atrophy noted Sensory: Pinprick and light touch intact throughout, bilaterally Deep Tendon Reflexes: 2+ and symmetric throughout Plantars: Right: downgoing   Left:  downgoing Cerebellar: normal finger-to-nose, normal rapid alternating movements and normal heel-to-shin test Gait: normal gait and station     Lab Results: Basic Metabolic Panel: Recent Labs  Lab 11/18/17 2358  NA 136  K 4.0  CL 105  CO2 21*  GLUCOSE 145*  BUN 20  CREATININE 1.56*  CALCIUM 8.9    CBC: Recent Labs  Lab 11/18/17 2358  WBC 8.0  NEUTROABS 5.6  HGB 13.2  HCT 38.7*  MCV 92.4  PLT 181    Coagulation Studies: Recent Labs    11/18/17 2358  LABPROT 13.4  INR 1.03    Imaging: Ct Head Wo Contrast  Result Date: 11/19/2017 CLINICAL DATA:  72 year old male with TIA. EXAM: CT HEAD WITHOUT CONTRAST TECHNIQUE: Contiguous axial images were obtained from the base of the skull through the vertex without intravenous contrast. COMPARISON:  Head CT dated 04/05/2004 FINDINGS: Brain: The ventricles and sulci  appropriate size for patient's age. Mild periventricular and deep white matter chronic microvascular ischemic changes noted. There is no acute intracranial hemorrhage. No mass effect or midline shift. No extra-axial fluid collection. Vascular: No hyperdense vessel or unexpected calcification. Skull: Normal. Negative for fracture or focal lesion. Sinuses/Orbits: There is diffuse mucoperiosteal thickening of paranasal sinuses. The mastoid air cells are clear. Other: None IMPRESSION: 1. No acute intracranial hemorrhage. 2. Mild age-related atrophy and chronic microvascular ischemic changes. Electronically Signed   By: Anner Crete M.D.   On: 11/19/2017 00:16   Mr Angiogram Head Wo Contrast  Result Date: 11/19/2017 CLINICAL DATA:  TIA.  Fall with head impact. EXAM: MRI HEAD WITHOUT CONTRAST MRA HEAD WITHOUT CONTRAST MRA NECK WITHOUT CONTRAST TECHNIQUE: Multiplanar, multiecho pulse sequences of the brain and surrounding structures were obtained without intravenous contrast. Angiographic images of the Circle of Willis were obtained using MRA technique without intravenous  contrast. Angiographic images of the neck were obtained using MRA technique without intravenous contrast. Carotid stenosis measurements (when applicable) are obtained utilizing NASCET criteria, using the distal internal carotid diameter as the denominator. COMPARISON:  Head CT 11/18/2017 FINDINGS: MRI HEAD FINDINGS Brain: The midline structures are normal. No focal diffusion restriction to indicate acute infarct. No intraparenchymal hemorrhage. There is hyperintense T2-weighted signal within the left insula and left medial temporal lobe. No associated diffusion restriction. No mass lesion. No chronic microhemorrhage or cerebral amyloid angiopathy. No hydrocephalus, age advanced atrophy or lobar predominant volume loss. No dural abnormality or extra-axial collection. Skull and upper cervical spine: The visualized skull base, calvarium, upper cervical spine and extracranial soft tissues are normal. Sinuses/Orbits: No fluid levels or advanced mucosal thickening. No mastoid effusion. Normal orbits. MRA HEAD FINDINGS Intracranial internal carotid arteries: Moderate narrowing of the lacerum segment of the left internal carotid artery at the skull base. Normal right ICA. Anterior cerebral arteries: Normal. Middle cerebral arteries: Normal. Posterior communicating arteries: Present on the left. Posterior cerebral arteries: Normal. Basilar artery: Normal. Vertebral arteries: Left dominant. Normal. Superior cerebellar arteries: Normal. Anterior inferior cerebellar arteries: Normal. Posterior inferior cerebellar arteries: Normal. MRA NECK FINDINGS Right carotid system: Normal course and caliber without stenosis or evidence of dissection. Left carotid system: Normal course and caliber without stenosis or evidence of dissection. Vertebral arteries: Left dominant. Vertebral artery origins are not clearly visualized. Vertebral arteries are normal in course and caliber to the vertebrobasilar confluence without stenosis or evidence  of dissection. IMPRESSION: 1. Hyperintense T2-weighted signal within the left insula and medial temporal lobe. While this may be a sequela of remote ischemia, there appears to be mild gyral edema rather than encephalomalacia. This pattern may be seen in multiple encephalitides, the most concerning of which is herpes encephalitis. If there is any clinical suspicion of herpes encephalitis, CSF sampling and empiric treatment should be considered. 2. Moderate narrowing of the left internal carotid artery skull base lacerum segment. Otherwise normal MRA of the head. 3. No hemodynamically significant stenosis of the cervical carotid or vertebral arteries. Electronically Signed   By: Ulyses Jarred M.D.   On: 11/19/2017 04:58   [name] Neck Wo Contrast  Result Date: 11/19/2017 CLINICAL DATA:  TIA.  Fall with head impact. EXAM: MRI HEAD WITHOUT CONTRAST MRA HEAD WITHOUT CONTRAST MRA NECK WITHOUT CONTRAST TECHNIQUE: Multiplanar, multiecho pulse sequences of the brain and surrounding structures were obtained without intravenous contrast. Angiographic images of the Circle of Willis were obtained using MRA technique without intravenous contrast. Angiographic images of the neck were obtained using  MRA technique without intravenous contrast. Carotid stenosis measurements (when applicable) are obtained utilizing NASCET criteria, using the distal internal carotid diameter as the denominator. COMPARISON:  Head CT 11/18/2017 FINDINGS: MRI HEAD FINDINGS Brain: The midline structures are normal. No focal diffusion restriction to indicate acute infarct. No intraparenchymal hemorrhage. There is hyperintense T2-weighted signal within the left insula and left medial temporal lobe. No associated diffusion restriction. No mass lesion. No chronic microhemorrhage or cerebral amyloid angiopathy. No hydrocephalus, age advanced atrophy or lobar predominant volume loss. No dural abnormality or extra-axial collection. Skull and upper cervical  spine: The visualized skull base, calvarium, upper cervical spine and extracranial soft tissues are normal. Sinuses/Orbits: No fluid levels or advanced mucosal thickening. No mastoid effusion. Normal orbits. MRA HEAD FINDINGS Intracranial internal carotid arteries: Moderate narrowing of the lacerum segment of the left internal carotid artery at the skull base. Normal right ICA. Anterior cerebral arteries: Normal. Middle cerebral arteries: Normal. Posterior communicating arteries: Present on the left. Posterior cerebral arteries: Normal. Basilar artery: Normal. Vertebral arteries: Left dominant. Normal. Superior cerebellar arteries: Normal. Anterior inferior cerebellar arteries: Normal. Posterior inferior cerebellar arteries: Normal. MRA NECK FINDINGS Right carotid system: Normal course and caliber without stenosis or evidence of dissection. Left carotid system: Normal course and caliber without stenosis or evidence of dissection. Vertebral arteries: Left dominant. Vertebral artery origins are not clearly visualized. Vertebral arteries are normal in course and caliber to the vertebrobasilar confluence without stenosis or evidence of dissection. IMPRESSION: 1. Hyperintense T2-weighted signal within the left insula and medial temporal lobe. While this may be a sequela of remote ischemia, there appears to be mild gyral edema rather than encephalomalacia. This pattern may be seen in multiple encephalitides, the most concerning of which is herpes encephalitis. If there is any clinical suspicion of herpes encephalitis, CSF sampling and empiric treatment should be considered. 2. Moderate narrowing of the left internal carotid artery skull base lacerum segment. Otherwise normal MRA of the head. 3. No hemodynamically significant stenosis of the cervical carotid or vertebral arteries. Electronically Signed   By: Ulyses Jarred M.D.   On: 11/19/2017 04:58   Mr Brain Wo Contrast  Result Date: 11/19/2017 CLINICAL DATA:  TIA.   Fall with head impact. EXAM: MRI HEAD WITHOUT CONTRAST MRA HEAD WITHOUT CONTRAST MRA NECK WITHOUT CONTRAST TECHNIQUE: Multiplanar, multiecho pulse sequences of the brain and surrounding structures were obtained without intravenous contrast. Angiographic images of the Circle of Willis were obtained using MRA technique without intravenous contrast. Angiographic images of the neck were obtained using MRA technique without intravenous contrast. Carotid stenosis measurements (when applicable) are obtained utilizing NASCET criteria, using the distal internal carotid diameter as the denominator. COMPARISON:  Head CT 11/18/2017 FINDINGS: MRI HEAD FINDINGS Brain: The midline structures are normal. No focal diffusion restriction to indicate acute infarct. No intraparenchymal hemorrhage. There is hyperintense T2-weighted signal within the left insula and left medial temporal lobe. No associated diffusion restriction. No mass lesion. No chronic microhemorrhage or cerebral amyloid angiopathy. No hydrocephalus, age advanced atrophy or lobar predominant volume loss. No dural abnormality or extra-axial collection. Skull and upper cervical spine: The visualized skull base, calvarium, upper cervical spine and extracranial soft tissues are normal. Sinuses/Orbits: No fluid levels or advanced mucosal thickening. No mastoid effusion. Normal orbits. MRA HEAD FINDINGS Intracranial internal carotid arteries: Moderate narrowing of the lacerum segment of the left internal carotid artery at the skull base. Normal right ICA. Anterior cerebral arteries: Normal. Middle cerebral arteries: Normal. Posterior communicating arteries: Present on the  left. Posterior cerebral arteries: Normal. Basilar artery: Normal. Vertebral arteries: Left dominant. Normal. Superior cerebellar arteries: Normal. Anterior inferior cerebellar arteries: Normal. Posterior inferior cerebellar arteries: Normal. MRA NECK FINDINGS Right carotid system: Normal course and caliber  without stenosis or evidence of dissection. Left carotid system: Normal course and caliber without stenosis or evidence of dissection. Vertebral arteries: Left dominant. Vertebral artery origins are not clearly visualized. Vertebral arteries are normal in course and caliber to the vertebrobasilar confluence without stenosis or evidence of dissection. IMPRESSION: 1. Hyperintense T2-weighted signal within the left insula and medial temporal lobe. While this may be a sequela of remote ischemia, there appears to be mild gyral edema rather than encephalomalacia. This pattern may be seen in multiple encephalitides, the most concerning of which is herpes encephalitis. If there is any clinical suspicion of herpes encephalitis, CSF sampling and empiric treatment should be considered. 2. Moderate narrowing of the left internal carotid artery skull base lacerum segment. Otherwise normal MRA of the head. 3. No hemodynamically significant stenosis of the cervical carotid or vertebral arteries. Electronically Signed   By: Ulyses Jarred M.D.   On: 11/19/2017 04:58   Dg Chest Port 1 View  Result Date: 11/19/2017 CLINICAL DATA:  72 year old male with shortness of breath. EXAM: PORTABLE CHEST 1 VIEW COMPARISON:  Chest radiograph dated 03/16/2013 FINDINGS: Minimal left lung base atelectatic changes. No focal consolidation, pleural effusion, or pneumothorax. The cardiac silhouette is within normal limits. No acute osseous pathology. IMPRESSION: No active disease. Electronically Signed   By: Anner Crete M.D.   On: 11/19/2017 02:17     ASSESSMENT AND PLAN  72 y.o. male with past medical history of diabetes, hypertension presents with episode of slurred speech and right-sided weakness after having a fall in the shower.  MRI of brain shows no acute stroke, however shows some hyperintensity in the left insula and medial temporal lobe which could represent an old stroke/old infection versus represents seizure activity.  MRA  of the head appears to be normal with no significant stenosis.   Plan:  Routine EEG Consider Keppra 500 mg BID  Continue ASA, statin Echo, AIC, Lipid profile NIHSS, neuro checks   Hortense Cantrall [facility] Pager Number [phone]

## 2017-11-19 NOTE — Evaluation (Signed)
Physical Therapy Evaluation Patient Details Name: Jorge [name] MRN: [id number] DOB: 1945/10/11 Today's Date: 11/19/2017   History of Present Illness  Jorge [name] is a 72yo white male who [facility] on 3/[facility] after a transient episode of dysarthira, RUE weakness, gait instability, all resolved PTA. PMH: DM2, HTN, CKD3, chronic hip pain s/p injection and PT, 2014 ACDF C5-7 s/p RUE pain and numbness.   Clinical Impression  Pt admitted with above diagnosis. Pt currently with functional limitations due to the deficits listed below (see "PT Problem List"). Pt reports complete resolution of all symptoms in question PTA. RUE, speech, balance, and AMB all at baseline. BBT: 51/56 indicative of mild falls risk, likely related to habitus and chronic hip dysfunction. Patient is at baseline, all education completed, and time is given to address all questions/concerns. No additional skilled PT services needed at this time, PT signing off. PT recommends daily ambulation ad lib or with nursing staff as needed to prevent deconditioning.      Follow Up Recommendations No PT follow up    Equipment Recommendations  None recommended by PT    Recommendations for Other Services       Precautions / Restrictions Precautions Precautions: None Restrictions Weight Bearing Restrictions: No      Mobility  Bed Mobility               General bed mobility comments: son assists with supine to sitting EOB dt chornic weakness  Transfers Overall transfer level: Independent                  Ambulation/Gait Ambulation/Gait assistance: Independent Ambulation Distance (Feet): 380 Feet Assistive device: None Gait Pattern/deviations: WFL(Within Functional Limits) Gait velocity: 1.45m/s      Stairs            Wheelchair Mobility    Modified Rankin (Stroke Patients Only)       Balance Overall balance assessment: Independent                               Standardized Balance  Assessment Standardized Balance Assessment : Berg Balance Test Berg Balance Test Sit to Stand: Able to stand without using hands and stabilize independently Standing Unsupported: Able to stand safely 2 minutes Sitting with Back Unsupported but Feet Supported on Floor or Stool: Able to sit safely and securely 2 minutes Stand to Sit: Sits safely with minimal use of hands Transfers: Able to transfer safely, minor use of hands Standing Unsupported with Eyes Closed: Able to stand 10 seconds safely Standing Ubsupported with Feet Together: Able to place feet together independently and stand 1 minute safely From Standing, Reach Forward with Outstretched Arm: Can reach confidently >25 cm (10") From Standing Position, Pick up Object from Floor: Able to pick up shoe safely and easily From Standing Position, Turn to Look Behind Over each Shoulder: Turn sideways only but maintains balance Turn 360 Degrees: Able to turn 360 degrees safely one side only in 4 seconds or less Standing Unsupported, Alternately Place Feet on Step/Stool: Able to stand independently and safely and complete 8 steps in 20 seconds Standing Unsupported, One Foot in Front: Able to plae foot ahead of the other independently and hold 30 seconds Standing on One Leg: Able to lift leg independently and hold equal to or more than 3 seconds Total Score: 50         Pertinent Vitals/Pain Pain Assessment: No/denies pain    Home  Living Family/patient expects to be discharged to:: Private residence Living Arrangements: Spouse/significant other Available Help at Discharge: Family;Available 24 hours/day Type of Home: House Home Access: Stairs to enter Entrance Stairs-Rails: None Entrance Stairs-Number of Steps: 1 Home Layout: One level   Additional Comments: no prior A/E use PTA. No gait instability, no falls history.     Prior Function Level of Independence: Independent               Hand Dominance   Dominant Hand:  Right    Extremity/Trunk Assessment   Upper Extremity Assessment Upper Extremity Assessment: Overall WFL for tasks assessed    Lower Extremity Assessment Lower Extremity Assessment: Overall WFL for tasks assessed       Communication   Communication: No difficulties  Cognition Arousal/Alertness: Awake/alert Behavior During Therapy: WFL for tasks assessed/performed Overall Cognitive Status: Within Functional Limits for tasks assessed                                        General Comments      Exercises     Assessment/Plan    PT Assessment Patent does not need any further PT services  PT Problem List         PT Treatment Interventions      PT Goals (Current goals can be found in the Care Plan section)  Acute Rehab PT Goals PT Goal Formulation: All assessment and education complete, DC therapy    Frequency     Barriers to discharge        Co-evaluation               AM-PAC PT "6 Clicks" Daily Activity  Outcome Measure Difficulty turning over in bed (including adjusting bedclothes, sheets and blankets)?: None Difficulty moving from lying on back to sitting on the side of the bed? : A Lot Difficulty sitting down on and standing up from a chair with arms (e.g., wheelchair, bedside commode, etc,.)?: A Little Help needed moving to and from a bed to chair (including a wheelchair)?: None Help needed walking in hospital room?: None Help needed climbing 3-5 steps with a railing? : None 6 Click Score: 21    End of Session   Activity Tolerance: Patient tolerated treatment well;No increased pain Patient left: in bed;with call bell/phone within reach;with family/visitor present   PT Visit Diagnosis: History of falling (Z91.81)    Time: 7169-6789 PT Time Calculation (min) (ACUTE ONLY): 13 min   Charges:   PT Evaluation $PT Eval Low Complexity: 1 Low     PT G Codes:        9:59 AM, December 15, 2017 Etta Grandchild, PT, DPT Physical Therapist  - [place] [phone] (Pager)  [phone] (Office)     Yamilka Lopiccolo C 12/15/17, 9:57 AM

## 2017-11-19 NOTE — Evaluation (Addendum)
Clinical/Bedside Swallow Evaluation Patient Details  Name: Jorge [name] MRN: [id number] Date of Birth: [birth year]  Today's Date: 11/19/2017 Time: SLP Start Time (ACUTE ONLY): 1021 SLP Stop Time (ACUTE ONLY): 1034 SLP Time Calculation (min) (ACUTE ONLY): 13 min  Past Medical History:  Past Medical History:  Diagnosis Date  . Diabetes mellitus without complication ([facility])   . Environmental allergies    Hx: of  . GERD (gastroesophageal reflux disease)   . Hypertension   . Numbness and tingling    Hx; of  Right shoulder   Past Surgical History:  Past Surgical History:  Procedure Laterality Date  . ANTERIOR CERVICAL DECOMP/DISCECTOMY FUSION N/A 03/17/2013   Procedure: ANTERIOR CERVICAL DECOMPRESSION/DISCECTOMY FUSION 2 LEVEL C5-7;  Surgeon: [name], MD;  Location: [facility];  Service: Orthopedics;  Laterality: N/A;  . COLONOSCOPY     Hx: of  . HERNIA REPAIR    . TONSILLECTOMY     HPI:  72 y.o.malewith past medical history of diabetes,hypertension presents with episode of slurred speech and right-sided weakness after having a fall in the shower. MRI of brain shows no acute stroke, however shows some hyperintensity in the left insula and medial temporal lobe which could represent an old stroke/old infection versus represents seizure activity.   Assessment / Plan / Recommendation Clinical Impression  Pt presents with normal oropharyngeal swallow marked by adequate mastication, brisk swallow response, no s/s of aspiration.  No dysphagia/. Resume regular diet, thin liquids.    Speech is also back to baseline; no dysarthria - symptoms resolved. Pt screened; no formal speech/language eval warranted.  Our services will sign off.  SLP Visit Diagnosis: Dysphagia, unspecified (R13.10)    Aspiration Risk  No limitations    Diet Recommendation   regular solids, thin liquids  Medication Administration: Whole meds with liquid    Other  Recommendations Oral Care Recommendations: Oral care  BID   Follow up Recommendations None      Frequency and Duration            Prognosis        Swallow Study   General Date of Onset: 11/18/17 HPI: 72 y.o.malewith past medical history of diabetes,hypertension presents with episode of slurred speech and right-sided weakness after having a fall in the shower. MRI of brain shows no acute stroke, however shows some hyperintensity in the left insula and medial temporal lobe which could represent an old stroke/old infection versus represents seizure activity. Type of Study: Bedside Swallow Evaluation Diet Prior to this Study: NPO Temperature Spikes Noted: No Respiratory Status: Room air History of Recent Intubation: No Behavior/Cognition: Alert;Cooperative Oral Cavity Assessment: Within Functional Limits Oral Care Completed by SLP: No Oral Cavity - Dentition: Adequate natural dentition Vision: Functional for self-feeding Self-Feeding Abilities: Able to feed self Patient Positioning: Upright in bed Baseline Vocal Quality: Normal Volitional Cough: Strong Volitional Swallow: Able to elicit    Oral/Motor/Sensory Function Overall Oral Motor/Sensory Function: Within functional limits   Ice Chips Ice chips: Within functional limits   Thin Liquid Thin Liquid: Within functional limits    Nectar Thick Nectar Thick Liquid: Not tested   Honey Thick Honey Thick Liquid: Not tested   Puree Puree: Within functional limits   Solid   GO   Solid: Within functional limits        Juan Quam Laurice 11/19/2017,10:41 AM

## 2017-11-19 NOTE — Progress Notes (Signed)
Patient arrived around 0500 from ED alert and oriented no neurological deficits,  He had returned to baseline while in the ED, patient states that he had a small reoccurrence of right arm being heavy while coming back from his CT scan but nothing since. Has some congestion with cough but lungs sound clear, BP running a little high. Wife is with him in the room will continue to monitor.

## 2017-11-19 NOTE — ED Notes (Signed)
MRI tech called to inform that pt needs medication to complete MRI; MD paged to get order for [facility]

## 2017-11-19 NOTE — ED Notes (Signed)
Admitting at bedside-Monique,RN  

## 2017-11-19 NOTE — Progress Notes (Signed)
Patient was seen and examined at bedside.  Admitted after midnight.  Please see H&P for detail.  72 year old male with history of diabetes, hypertension, chronic kidney disease stage III, chronic hip pain presented after slurred speech and right-sided weakness, unsteadiness on his feet.  Patient was recently diagnosed with bronchitis.  Evaluated by neurologist.  MRI of the brain shows no acute distress however does show some hyperintensity in the left insula and medial temporal lobe which could represent an old stroke versus represent seizure activity.  Neurologist recommended EEG, Keppra 500 twice daily, echocardiogram, A1c and lipid panel. Continue aspirin and statin. A1c;6.6 LDL:114 SLP eval  Discussed with the patient and family members at bedside.  Follow-up echo, EEG and neurologist plan.

## 2017-11-19 NOTE — Progress Notes (Signed)
Pt passed swallow evaluation, medication will be given once patient returns from EEG

## 2017-11-20 ENCOUNTER — Other Ambulatory Visit: Payer: Self-pay | Admitting: Cardiology

## 2017-11-20 ENCOUNTER — Observation Stay (HOSPITAL_BASED_OUTPATIENT_CLINIC_OR_DEPARTMENT_OTHER): Payer: Medicare Other

## 2017-11-20 ENCOUNTER — Other Ambulatory Visit: Payer: Self-pay

## 2017-11-20 DIAGNOSIS — G459 Transient cerebral ischemic attack, unspecified: Secondary | ICD-10-CM

## 2017-11-20 DIAGNOSIS — I10 Essential (primary) hypertension: Secondary | ICD-10-CM | POA: Diagnosis not present

## 2017-11-20 DIAGNOSIS — E119 Type 2 diabetes mellitus without complications: Secondary | ICD-10-CM | POA: Diagnosis not present

## 2017-11-20 DIAGNOSIS — N183 Chronic kidney disease, stage 3 (moderate): Secondary | ICD-10-CM | POA: Diagnosis not present

## 2017-11-20 DIAGNOSIS — R29818 Other symptoms and signs involving the nervous system: Secondary | ICD-10-CM | POA: Diagnosis not present

## 2017-11-20 LAB — BASIC METABOLIC PANEL
ANION GAP: 9 (ref 5–15)
BUN: 13 mg/dL (ref 6–20)
CALCIUM: 8.6 mg/dL — AB (ref 8.9–10.3)
CO2: 21 mmol/L — AB (ref 22–32)
Chloride: 106 mmol/L (ref 101–111)
Creatinine, Ser: 1.14 mg/dL (ref 0.61–1.24)
GFR calc Af Amer: >60 mL/min (ref >60)
GLUCOSE: 135 mg/dL — AB (ref 65–99)
Potassium: 3.6 mmol/L (ref 3.5–5.1)
Sodium: 136 mmol/L (ref 135–145)

## 2017-11-20 LAB — GLUCOSE, CAPILLARY
Glucose-Capillary: 133 mg/dL — ABNORMAL HIGH (ref 65–99)
Glucose-Capillary: 172 mg/dL — ABNORMAL HIGH (ref 65–99)

## 2017-11-20 LAB — ECHOCARDIOGRAM COMPLETE: HEIGHTINCHES: 71 in

## 2017-11-20 MED ORDER — CLOPIDOGREL BISULFATE 75 MG PO TABS: 75.0000 mg | ORAL_TABLET | Freq: Every day | ORAL | 0 refills | Status: DC

## 2017-11-20 MED ORDER — ASPIRIN EC 81 MG PO TBEC: 81.0000 mg | DELAYED_RELEASE_TABLET | Freq: Every day | ORAL | Status: DC

## 2017-11-20 MED ORDER — ASPIRIN 81 MG PO TBEC: 81.0000 mg | DELAYED_RELEASE_TABLET | Freq: Every day | ORAL | 0 refills | Status: AC

## 2017-11-20 MED ORDER — SIMVASTATIN 40 MG PO TABS: 40.0000 mg | ORAL_TABLET | Freq: Every day | ORAL | Status: DC

## 2017-11-20 MED ORDER — SIMVASTATIN 20 MG PO TABS: 40.0000 mg | ORAL_TABLET | Freq: Every day | ORAL | 0 refills | Status: DC

## 2017-11-20 MED ORDER — CLOPIDOGREL BISULFATE 75 MG PO TABS
75.0000 mg | ORAL_TABLET | Freq: Every day | ORAL | Status: DC
  Administered 2017-11-20: 75 mg via ORAL
  Filled 2017-11-20: qty 1

## 2017-11-20 NOTE — Progress Notes (Signed)
Md(Bhandari ) at bedside with patient and family answering all questions and concerns. Patient waiting for ECHO that will be done at bedside, and possible D/C

## 2017-11-20 NOTE — Care Management Obs Status (Signed)
[facility] NOTIFICATION   Patient Details  Name: Stevenson Windmiller MRN: [id number] Date of Birth: [birth year]   Medicare Observation Status Notification Given:  Yes    Pollie Friar, RN 11/20/2017, 12:15 PM

## 2017-11-20 NOTE — Progress Notes (Signed)
Pt discharging home with self care. Patients IV removed(intact), with no apparent bruising. Tele contacted and monitor removed. Medications and AVS reviewed with patient and wife, patient informed not to take Plavix today. Patient assisted down stairs while wife went to get car.

## 2017-11-20 NOTE — Care Management Note (Signed)
Case Management Note  Patient Details  Name: Henri Baumler MRN: [id number] Date of Birth: 07/29/1946  Subjective/Objective:                    Action/Plan: Pt discharging home with self care. No f/u per PT/OT and no DME needs. Pt has insurance, PCP and transportation home. No further needs per CM.    Expected Discharge Date:  11/20/17               Expected Discharge Plan:  Home/Self Care  In-House Referral:     Discharge planning Services     Post Acute Care Choice:    Choice offered to:     DME Arranged:    DME Agency:     HH Arranged:    HH Agency:     Status of Service:  Completed, signed off  If discussed at [employer] of Stay Meetings, dates discussed:    Additional Comments:  Pollie Friar, RN 11/20/2017, 2:19 PM

## 2017-11-20 NOTE — Discharge Summary (Signed)
Physician Discharge Summary  Jorge [name] RWE:[id number] DOB: 1945/12/05 DOA: 11/18/2017  PCP: Mayer Camel, NP  Admit date: 11/18/2017 Discharge date: 11/20/2017  Admitted From:home Disposition:home  Recommendations for Outpatient Follow-up:  1. Follow up with PCP in 1-2 weeks 2. Please follow-up with neurologist in 3-4 weeks.   Home Health:no Equipment/Devices:no Discharge Condition:stable CODE STATUS:full code Diet recommendation:carb modified heart healthy  Brief/Interim Summary: 72 year old male with history of type 2 diabetes, hypertension, chronic kidney disease stage III, chronic hip pain presented with slurred speech and right-sided weakness likely transient ischemic attack.  Better by neurologist.  MRI showed hyperintensity in the left insula and medial temporal lobe which could be chronic stroke as per neurologist.  EEG was done.  Discussed with Dr. Erlinda Hong, who do not think patient has seizure therefore the Fidelity was discontinued.  He recommended to continue aspirin and Plavix for 3 weeks and then Plavix alone at home.  Reportedly patient was taking aspirin at home before this admission.  Echocardiogram with hyperdynamic systolic function.  Continue statin.  Patient clinically improved.  Stable to discharge home with outpatient follow-up. A1c 6.6, LDL 114. Cardiology office was contacted to arrange for outpatient 30 days event monitor.  Discharge Diagnoses:  Principal Problem:   TIA (transient ischemic attack) Active Problems:   Hypertension   Diabetes mellitus without complication (HCC)   CKD (chronic kidney disease), stage III (H[facility])   Transient neurologic deficit   Chronic pain    Discharge Instructions  Discharge Instructions    Ambulatory referral to Neurology   Complete by:  As directed    An appointment is requested in approximately: 4 weeks   Call MD for:  difficulty breathing, headache or visual disturbances   Complete by:  As directed    Call  MD for:  extreme fatigue   Complete by:  As directed    Call MD for:  hives   Complete by:  As directed    Call MD for:  persistant dizziness or light-headedness   Complete by:  As directed    Call MD for:  persistant nausea and vomiting   Complete by:  As directed    Call MD for:  severe uncontrolled pain   Complete by:  As directed    Call MD for:  temperature >100.4   Complete by:  As directed    Diet - low sodium heart healthy   Complete by:  As directed    Diet Carb Modified   Complete by:  As directed    Discharge instructions   Complete by:  As directed    Please take both aspirin and Plavix per 3 weeks and then Plavix only.  The cardiology clinic will call to arrange for 30 days event monitor.   Increase activity slowly   Complete by:  As directed      Allergies as of 11/20/2017   No Known Allergies     Medication List    TAKE these medications   aspirin 81 MG EC tablet Take 1 tablet (81 mg total) by mouth daily for 21 days. Start taking on:  11/21/2017   carvedilol 12.5 MG tablet Commonly known as:  COREG Take 12.5 mg by mouth 2 (two) times daily with a meal.   clopidogrel 75 MG tablet Commonly known as:  PLAVIX Take 1 tablet (75 mg total) by mouth daily.   docusate sodium 100 MG capsule Commonly known as:  COLACE Take 1 capsule (100 mg total) by mouth 3 (three) times daily  as needed for constipation.   glipiZIDE 10 MG 24 hr tablet Commonly known as:  GLUCOTROL XL Take 10 mg by mouth daily with breakfast.   HYDROcodone-acetaminophen 10-325 MG tablet Commonly known as:  NORCO Take 1 tablet by mouth every 6 (six) hours as needed for pain.   linagliptin 5 MG Tabs tablet Commonly known as:  TRADJENTA Take 5 mg by mouth daily with breakfast.   losartan 50 MG tablet Commonly known as:  COZAAR Take 50 mg by mouth daily with breakfast.   metFORMIN 500 MG 24 hr tablet Commonly known as:  GLUCOPHAGE-XR Take 500 mg by mouth 2 (two) times daily with a  meal.   methocarbamol 500 MG tablet Commonly known as:  ROBAXIN Take 1 tablet (500 mg total) by mouth 3 (three) times daily as needed. What changed:  reasons to take this   ondansetron 4 MG tablet Commonly known as:  ZOFRAN Take 1 tablet (4 mg total) by mouth every 8 (eight) hours as needed for nausea.   pioglitazone 30 MG tablet Commonly known as:  ACTOS Take 30 mg by mouth daily.   simvastatin 20 MG tablet Commonly known as:  ZOCOR Take 2 tablets (40 mg total) by mouth at bedtime. What changed:  how much to take   VITAMIN C-62 IJ Inject 1 Applicatorful as directed every 30 (thirty) days.   ZYRTEC PO Take 1 tablet by mouth daily as needed (allergies).      Follow-up Information    Brown-Patram, Ruthell Rummage, NP. Schedule an appointment as soon as possible for a visit in 1 week(s).   Specialty:  Internal Medicine Contact information: [place] [address] [phone]        [facility]. Schedule an appointment as soon as possible for a visit in 4 week(s).   Specialty:  Neurology Why:  stroke followup Contact information: [place] [phone]         No Known Allergies  Consultations: Neurologist  Procedures/Studies: MRI, EEG, echo  Subjective: Seen and examined at bedside.  Doing well.  Denies headache, dizziness, nausea vomiting chest pain shortness of breath.  No weakness or numbness.  Wife at bedside.  Discharge Exam: Vitals:   11/20/17 0821 11/20/17 1231  BP: (!) 160/82 (!) 152/74  Pulse: 75 68  Resp: 18   Temp: 98.6 F (37 C) 98.5 F (36.9 C)  SpO2: 98% 100%   Vitals:   11/20/17 0400 11/20/17 0500 11/20/17 0821 11/20/17 1231  BP: (!) 153/57  (!) 160/82 (!) 152/74  Pulse: 82  75 68  Resp: 18  18   Temp: 98.5 F (36.9 C)  98.6 F (37 C) 98.5 F (36.9 C)  TempSrc: Oral  Oral Oral  SpO2: 93%  98% 100%  Height:  5\' 11"  (1.803 m)      General:  Pt is alert, awake, not in acute distress Cardiovascular: RRR, S1/S2 +, no rubs, no gallops Respiratory: CTA bilaterally, no wheezing, no rhonchi Abdominal: Soft, NT, ND, bowel sounds + Extremities: no edema, no cyanosis    The results of significant diagnostics from this hospitalization (including imaging, microbiology, ancillary and laboratory) are listed below for reference.     Microbiology: No results found for this or any previous visit (from the past 240 hour(s)).   Labs: BNP (last 3 results) No results for input(s): BNP in the last 8760 hours. Basic Metabolic Panel: Recent Labs  Lab 11/18/17 2358 11/20/17 0353  NA 136 136  K 4.0 3.6  CL 105 106  CO2 21* 21*  GLUCOSE 145* 135*  BUN 20 13  CREATININE 1.56* 1.14  CALCIUM 8.9 8.6*   Liver Function Tests: Recent Labs  Lab 11/18/17 2358  AST 29  ALT 25  ALKPHOS 83  BILITOT 1.0  PROT 7.4  ALBUMIN 3.8   No results for input(s): LIPASE, AMYLASE in the last 168 hours. No results for input(s): AMMONIA in the last 168 hours. CBC: Recent Labs  Lab 11/18/17 2358  WBC 8.0  NEUTROABS 5.6  HGB 13.2  HCT 38.7*  MCV 92.4  PLT 181   Cardiac Enzymes: No results for input(s): CKTOTAL, CKMB, CKMBINDEX, TROPONINI in the last 168 hours. BNP: Invalid input(s): POCBNP CBG: Recent Labs  Lab 11/19/17 1617 11/19/17 2156 11/19/17 2224 11/20/17 0606 11/20/17 1119  GLUCAP 131* 457* 184* 133* 172*   D-Dimer No results for input(s): DDIMER in the last 72 hours. Hgb A1c Recent Labs    11/19/17 0215  HGBA1C 6.6*   Lipid Profile Recent Labs    11/19/17 0215  CHOL 174  HDL 36*  LDLCALC 114*  TRIG 122  CHOLHDL 4.8   Thyroid function studies No results for input(s): TSH, T4TOTAL, T3FREE, THYROIDAB in the last 72 hours.  Invalid input(s): FREET3 Anemia work up No results for input(s): VITAMINB12, FOLATE, FERRITIN, TIBC, IRON, RETICCTPCT in the last 72 hours. Urinalysis    Component Value Date/Time    COLORURINE YELLOW 11/19/2017 0146   APPEARANCEUR CLEAR 11/19/2017 0146   LABSPEC 1.018 11/19/2017 0146   PHURINE 5.0 11/19/2017 0146   GLUCOSEU NEGATIVE 11/19/2017 0146   HGBUR NEGATIVE 11/19/2017 0146   BILIRUBINUR NEGATIVE 11/19/2017 0146   KETONESUR 5 (A) 11/19/2017 0146   PROTEINUR 30 (A) 11/19/2017 0146   NITRITE NEGATIVE 11/19/2017 0146   LEUKOCYTESUR NEGATIVE 11/19/2017 0146   Sepsis Labs Invalid input(s): PROCALCITONIN,  WBC,  LACTICIDVEN Microbiology No results found for this or any previous visit (from the past 240 hour(s)).   Time coordinating discharge:  30 minutes  SIGNED:   Rosita Fire, MD  [facility] 11/20/2017, 1:43 PM  If 7PM-7AM, please contact night-coverage [email or web address] Password [email or web address]

## 2017-11-20 NOTE — Progress Notes (Signed)
  Echocardiogram 2D Echocardiogram has been performed.  Jorge [name] 11/20/2017, 11:51 AM

## 2017-11-20 NOTE — Progress Notes (Signed)
card

## 2017-11-20 NOTE — Progress Notes (Addendum)
STROKE TEAM PROGRESS NOTE   SUBJECTIVE (INTERVAL HISTORY) His wife, dtr-in-law (who works in dermatology) and other family is at the bedside.  He has remained neuro intact since he resolved the night prior. He felt dizzy in the bathtub, tried to sit on the edge and fell into the tub and could not get out. His wife reported he has R sided weakness in his face, arm and leg. No suspicion for herpes encephalitis.   OBJECTIVE Vitals:   11/20/17 0000 11/20/17 0400 11/20/17 0500 11/20/17 0821  BP: 106/77 (!) 153/57  (!) 160/82  Pulse: 82 82  75  Resp: 18 18  18   Temp: 98 F (36.7 C) 98.5 F (36.9 C)  98.6 F (37 C)  TempSrc: Oral Oral  Oral  SpO2: 97% 93%  98%  Height:   5\' 11"  (1.803 m)     CBC:  Recent Labs  Lab 11/18/17 2358  WBC 8.0  NEUTROABS 5.6  HGB 13.2  HCT 38.7*  MCV 92.4  PLT 716    Basic Metabolic Panel:  Recent Labs  Lab 11/18/17 2358 11/20/17 0353  NA 136 136  K 4.0 3.6  CL 105 106  CO2 21* 21*  GLUCOSE 145* 135*  BUN 20 13  CREATININE 1.56* 1.14  CALCIUM 8.9 8.6*    Lipid Panel:     Component Value Date/Time   CHOL 174 11/19/2017 0215   TRIG 122 11/19/2017 0215   HDL 36 (L) 11/19/2017 0215   CHOLHDL 4.8 11/19/2017 0215   VLDL 24 11/19/2017 0215   LDLCALC 114 (H) 11/19/2017 0215   HgbA1c:  Lab Results  Component Value Date   HGBA1C 6.6 (H) 11/19/2017   Urine Drug Screen:     Component Value Date/Time   LABOPIA POSITIVE (A) 11/19/2017 0146   COCAINSCRNUR NONE DETECTED 11/19/2017 0146   LABBENZ NONE DETECTED 11/19/2017 0146   AMPHETMU NONE DETECTED 11/19/2017 0146   THCU NONE DETECTED 11/19/2017 0146   LABBARB NONE DETECTED 11/19/2017 0146    Alcohol Level     Component Value Date/Time   ETH <10 11/18/2017 2358    IMAGING  Ct Head Wo Contrast  Result Date: 11/19/2017 CLINICAL DATA:  72 year old male with TIA. EXAM: CT HEAD WITHOUT CONTRAST TECHNIQUE: Contiguous axial images were obtained from the base of the skull through the  vertex without intravenous contrast. COMPARISON:  Head CT dated 04/05/2004 FINDINGS: Brain: The ventricles and sulci appropriate size for patient's age. Mild periventricular and deep white matter chronic microvascular ischemic changes noted. There is no acute intracranial hemorrhage. No mass effect or midline shift. No extra-axial fluid collection. Vascular: No hyperdense vessel or unexpected calcification. Skull: Normal. Negative for fracture or focal lesion. Sinuses/Orbits: There is diffuse mucoperiosteal thickening of paranasal sinuses. The mastoid air cells are clear. Other: None IMPRESSION: 1. No acute intracranial hemorrhage. 2. Mild age-related atrophy and chronic microvascular ischemic changes. Electronically Signed   By: Anner Crete M.D.   On: 11/19/2017 00:16   Mr Angiogram Head Wo Contrast  Result Date: 11/19/2017 CLINICAL DATA:  TIA.  Fall with head impact. EXAM: MRI HEAD WITHOUT CONTRAST MRA HEAD WITHOUT CONTRAST MRA NECK WITHOUT CONTRAST TECHNIQUE: Multiplanar, multiecho pulse sequences of the brain and surrounding structures were obtained without intravenous contrast. Angiographic images of the Circle of Willis were obtained using MRA technique without intravenous contrast. Angiographic images of the neck were obtained using MRA technique without intravenous contrast. Carotid stenosis measurements (when applicable) are obtained utilizing NASCET criteria, using the distal internal  carotid diameter as the denominator. COMPARISON:  Head CT 11/18/2017 FINDINGS: MRI HEAD FINDINGS Brain: The midline structures are normal. No focal diffusion restriction to indicate acute infarct. No intraparenchymal hemorrhage. There is hyperintense T2-weighted signal within the left insula and left medial temporal lobe. No associated diffusion restriction. No mass lesion. No chronic microhemorrhage or cerebral amyloid angiopathy. No hydrocephalus, age advanced atrophy or lobar predominant volume loss. No dural  abnormality or extra-axial collection. Skull and upper cervical spine: The visualized skull base, calvarium, upper cervical spine and extracranial soft tissues are normal. Sinuses/Orbits: No fluid levels or advanced mucosal thickening. No mastoid effusion. Normal orbits. MRA HEAD FINDINGS Intracranial internal carotid arteries: Moderate narrowing of the lacerum segment of the left internal carotid artery at the skull base. Normal right ICA. Anterior cerebral arteries: Normal. Middle cerebral arteries: Normal. Posterior communicating arteries: Present on the left. Posterior cerebral arteries: Normal. Basilar artery: Normal. Vertebral arteries: Left dominant. Normal. Superior cerebellar arteries: Normal. Anterior inferior cerebellar arteries: Normal. Posterior inferior cerebellar arteries: Normal. MRA NECK FINDINGS Right carotid system: Normal course and caliber without stenosis or evidence of dissection. Left carotid system: Normal course and caliber without stenosis or evidence of dissection. Vertebral arteries: Left dominant. Vertebral artery origins are not clearly visualized. Vertebral arteries are normal in course and caliber to the vertebrobasilar confluence without stenosis or evidence of dissection. IMPRESSION: 1. Hyperintense T2-weighted signal within the left insula and medial temporal lobe. While this may be a sequela of remote ischemia, there appears to be mild gyral edema rather than encephalomalacia. This pattern may be seen in multiple encephalitides, the most concerning of which is herpes encephalitis. If there is any clinical suspicion of herpes encephalitis, CSF sampling and empiric treatment should be considered. 2. Moderate narrowing of the left internal carotid artery skull base lacerum segment. Otherwise normal MRA of the head. 3. No hemodynamically significant stenosis of the cervical carotid or vertebral arteries. Electronically Signed   By: Ulyses Jarred M.D.   On: 11/19/2017 04:58   Mr Jorge [name]  Neck Wo Contrast  Result Date: 11/19/2017 CLINICAL DATA:  TIA.  Fall with head impact. EXAM: MRI HEAD WITHOUT CONTRAST MRA HEAD WITHOUT CONTRAST MRA NECK WITHOUT CONTRAST TECHNIQUE: Multiplanar, multiecho pulse sequences of the brain and surrounding structures were obtained without intravenous contrast. Angiographic images of the Circle of Willis were obtained using MRA technique without intravenous contrast. Angiographic images of the neck were obtained using MRA technique without intravenous contrast. Carotid stenosis measurements (when applicable) are obtained utilizing NASCET criteria, using the distal internal carotid diameter as the denominator. COMPARISON:  Head CT 11/18/2017 FINDINGS: MRI HEAD FINDINGS Brain: The midline structures are normal. No focal diffusion restriction to indicate acute infarct. No intraparenchymal hemorrhage. There is hyperintense T2-weighted signal within the left insula and left medial temporal lobe. No associated diffusion restriction. No mass lesion. No chronic microhemorrhage or cerebral amyloid angiopathy. No hydrocephalus, age advanced atrophy or lobar predominant volume loss. No dural abnormality or extra-axial collection. Skull and upper cervical spine: The visualized skull base, calvarium, upper cervical spine and extracranial soft tissues are normal. Sinuses/Orbits: No fluid levels or advanced mucosal thickening. No mastoid effusion. Normal orbits. MRA HEAD FINDINGS Intracranial internal carotid arteries: Moderate narrowing of the lacerum segment of the left internal carotid artery at the skull base. Normal right ICA. Anterior cerebral arteries: Normal. Middle cerebral arteries: Normal. Posterior communicating arteries: Present on the left. Posterior cerebral arteries: Normal. Basilar artery: Normal. Vertebral arteries: Left dominant. Normal. Superior cerebellar arteries: Normal. Anterior  inferior cerebellar arteries: Normal. Posterior inferior cerebellar arteries: Normal.  MRA NECK FINDINGS Right carotid system: Normal course and caliber without stenosis or evidence of dissection. Left carotid system: Normal course and caliber without stenosis or evidence of dissection. Vertebral arteries: Left dominant. Vertebral artery origins are not clearly visualized. Vertebral arteries are normal in course and caliber to the vertebrobasilar confluence without stenosis or evidence of dissection. IMPRESSION: 1. Hyperintense T2-weighted signal within the left insula and medial temporal lobe. While this may be a sequela of remote ischemia, there appears to be mild gyral edema rather than encephalomalacia. This pattern may be seen in multiple encephalitides, the most concerning of which is herpes encephalitis. If there is any clinical suspicion of herpes encephalitis, CSF sampling and empiric treatment should be considered. 2. Moderate narrowing of the left internal carotid artery skull base lacerum segment. Otherwise normal MRA of the head. 3. No hemodynamically significant stenosis of the cervical carotid or vertebral arteries. Electronically Signed   By: Ulyses Jarred M.D.   On: 11/19/2017 04:58   Mr Brain Wo Contrast  Result Date: 11/19/2017 CLINICAL DATA:  TIA.  Fall with head impact. EXAM: MRI HEAD WITHOUT CONTRAST MRA HEAD WITHOUT CONTRAST MRA NECK WITHOUT CONTRAST TECHNIQUE: Multiplanar, multiecho pulse sequences of the brain and surrounding structures were obtained without intravenous contrast. Angiographic images of the Circle of Willis were obtained using MRA technique without intravenous contrast. Angiographic images of the neck were obtained using MRA technique without intravenous contrast. Carotid stenosis measurements (when applicable) are obtained utilizing NASCET criteria, using the distal internal carotid diameter as the denominator. COMPARISON:  Head CT 11/18/2017 FINDINGS: MRI HEAD FINDINGS Brain: The midline structures are normal. No focal diffusion restriction to indicate  acute infarct. No intraparenchymal hemorrhage. There is hyperintense T2-weighted signal within the left insula and left medial temporal lobe. No associated diffusion restriction. No mass lesion. No chronic microhemorrhage or cerebral amyloid angiopathy. No hydrocephalus, age advanced atrophy or lobar predominant volume loss. No dural abnormality or extra-axial collection. Skull and upper cervical spine: The visualized skull base, calvarium, upper cervical spine and extracranial soft tissues are normal. Sinuses/Orbits: No fluid levels or advanced mucosal thickening. No mastoid effusion. Normal orbits. MRA HEAD FINDINGS Intracranial internal carotid arteries: Moderate narrowing of the lacerum segment of the left internal carotid artery at the skull base. Normal right ICA. Anterior cerebral arteries: Normal. Middle cerebral arteries: Normal. Posterior communicating arteries: Present on the left. Posterior cerebral arteries: Normal. Basilar artery: Normal. Vertebral arteries: Left dominant. Normal. Superior cerebellar arteries: Normal. Anterior inferior cerebellar arteries: Normal. Posterior inferior cerebellar arteries: Normal. MRA NECK FINDINGS Right carotid system: Normal course and caliber without stenosis or evidence of dissection. Left carotid system: Normal course and caliber without stenosis or evidence of dissection. Vertebral arteries: Left dominant. Vertebral artery origins are not clearly visualized. Vertebral arteries are normal in course and caliber to the vertebrobasilar confluence without stenosis or evidence of dissection. IMPRESSION: 1. Hyperintense T2-weighted signal within the left insula and medial temporal lobe. While this may be a sequela of remote ischemia, there appears to be mild gyral edema rather than encephalomalacia. This pattern may be seen in multiple encephalitides, the most concerning of which is herpes encephalitis. If there is any clinical suspicion of herpes encephalitis, CSF  sampling and empiric treatment should be considered. 2. Moderate narrowing of the left internal carotid artery skull base lacerum segment. Otherwise normal MRA of the head. 3. No hemodynamically significant stenosis of the cervical carotid or vertebral arteries. Electronically Signed  By: Ulyses Jarred M.D.   On: 11/19/2017 04:58   Dg Chest Port 1 View  Result Date: 11/19/2017 CLINICAL DATA:  72 year old male with shortness of breath. EXAM: PORTABLE CHEST 1 VIEW COMPARISON:  Chest radiograph dated 03/16/2013 FINDINGS: Minimal left lung base atelectatic changes. No focal consolidation, pleural effusion, or pneumothorax. The cardiac silhouette is within normal limits. No acute osseous pathology. IMPRESSION: No active disease. Electronically Signed   By: Anner Crete M.D.   On: 11/19/2017 02:17   TEE - Left ventricle: The cavity size was normal. There was mild concentric hypertrophy. Systolic function was vigorous. The estimated ejection fraction was in the range of 65% to 70%. Wall motion was normal; there were no regional wall motion abnormalities. Doppler parameters are consistent with abnormal left ventricular relaxation (grade 1 diastolic dysfunction). There was no evidence of elevated ventricular filling pressure by Doppler parameters. - Mitral valve: There was trivial regurgitation. - Right ventricle: The cavity size was normal. Wall thickness was normal. Systolic function was normal. - Tricuspid valve: There was no significant regurgitation. - Pulmonary arteries: Systolic pressure could not be accurately estimated. - Inferior vena cava: The vessel was normal in size. Impressions:  No prior study available for comparison.   PHYSICAL EXAM Patient alert and oriented x 3. Speech clear. No aphasia. No dysarthria. Extraoccular movements intact. Visual fields full. Face symmetric. Tongue midline. Moves all extremities x 4. Strength normal. Coordination normal. Sensation intact. Heart rate  regular. Breath sounds clear.    ASSESSMENT/PLAN [name] is a 72 y.o. male with history of DM, HTN, presenting with slurred speech and R sided weakness following a fall in the shower.   L brain TIA  Resultant  Neuro deficits resolved  CT head no acute abnormality  MRI head L insular & mesial temporal lobe hyperintesity, previous stroke vs. Seizure   MRA neck narrowing L ICA at skullbase  MRA head normal  2D Echo  EF 65-70%. No source of embolus   EEG diffuse slowing of the waking background  LDL 114  HgbA1c 6.6  Heparin 5000 units sq tid for VTE prophylaxis  Fall precautions  Diet Carb Modified Fluid consistency: Thin; Room service appropriate? Yes  aspirin 325 mg daily prior to admission, now on aspirin 325 mg daily. Given stroke d/t small vessel disease, will place on aspirin 81 mg and plavix 75 mg daily x 3 weeks, then plavix alone. Orders placed.   Patient counseled to be compliant with his antithrombotic medications  Ongoing aggressive stroke risk factor management  Therapy recommendations:  No therapy needs  Disposition:  Home w/ wife  Ok for discharge from stroke standpoint  followup with [facility] in 4 weeks. Order placed.   Hypertension  Stable  BP goal normotensive  Hyperlipidemia  Home meds:  zocor 20  LDL 114, goal < 70  Increased zocor to 40 mg daily  Continue statin at discharge  Diabetes type II  HgbA1c 6.6, goal < 7.0  Controlled  Other Stroke Risk Factors  Advanced age  Former Cigarette smoker  UDS positive for opiates; ETOH neg  Obesity, Body mass index is 30.5 kg/m., recommend weight loss, diet and exercise as appropriate   Other Active Problems  CKD stage III  Chronic pain (old hip injury)  Hospital day # [place] Line for Pager information 11/20/2017 1:37 PM   ATTENDING NOTE: I reviewed above note and agree with the assessment and plan. I have made  any additions or  clarifications directly to the above note. Pt was seen and examined.   72 year old male with history of diabetes and hypertension admitted for episode of slurred speech right-sided weakness causing fall.  Symptoms improved and currently resolved.  MRI showed no acute stroke, however, left insular and medial temporal lobe increased T2 hyperintensity signal, concerning for remote stroke versus seizure activity.  He was put on Keppra for empiric treatment.  EEG done later showed no seizure activity.  Had review of CT head without contrast showed possible left insular cortex and medial temporal lobe remote infarct, although imaging quality was poor.  Therefore, Keppra was discontinued.  Given potential embolic pattern of remote strokes, recommend 30-day cardio event monitor as outpatient to rule out A. Fib.  EF 65-70%.  LDL 114, A1c 6.6.  Patient on aspirin 81 at home.  Recommend aspirin 81 and Plavix 75 for 3 weeks and then Plavix alone.  Neurology will sign off. Please call with questions. Pt will follow up with stroke clinic NP at [facility] in about 4 weeks. Thanks for the consult.   Rosalin Hawking, MD PhD Stroke Neurology 11/20/2017 2:19 PM    To contact Stroke Continuity provider, please refer to [email or web address] After hours, contact General Neurology

## 2017-12-14 ENCOUNTER — Ambulatory Visit (INDEPENDENT_AMBULATORY_CARE_PROVIDER_SITE_OTHER): Payer: Medicare Other

## 2017-12-14 ENCOUNTER — Other Ambulatory Visit: Payer: Self-pay | Admitting: Cardiology

## 2017-12-14 DIAGNOSIS — G459 Transient cerebral ischemic attack, unspecified: Secondary | ICD-10-CM

## 2017-12-14 DIAGNOSIS — R42 Dizziness and giddiness: Secondary | ICD-10-CM

## 2017-12-14 DIAGNOSIS — I639 Cerebral infarction, unspecified: Secondary | ICD-10-CM

## 2017-12-14 DIAGNOSIS — I4891 Unspecified atrial fibrillation: Secondary | ICD-10-CM | POA: Diagnosis not present

## 2017-12-21 ENCOUNTER — Ambulatory Visit (INDEPENDENT_AMBULATORY_CARE_PROVIDER_SITE_OTHER): Payer: Medicare Other | Admitting: Adult Health

## 2017-12-21 ENCOUNTER — Encounter: Payer: Self-pay | Admitting: Adult Health

## 2017-12-21 VITALS — BP 153/84 | HR 61 | Ht 71.0 in | Wt 222.0 lb

## 2017-12-21 DIAGNOSIS — I639 Cerebral infarction, unspecified: Secondary | ICD-10-CM

## 2017-12-21 DIAGNOSIS — I10 Essential (primary) hypertension: Secondary | ICD-10-CM

## 2017-12-21 DIAGNOSIS — E119 Type 2 diabetes mellitus without complications: Secondary | ICD-10-CM

## 2017-12-21 DIAGNOSIS — G459 Transient cerebral ischemic attack, unspecified: Secondary | ICD-10-CM

## 2017-12-21 DIAGNOSIS — E785 Hyperlipidemia, unspecified: Secondary | ICD-10-CM

## 2017-12-21 NOTE — Progress Notes (Signed)
[facility] [address] [place]. [place] [address] [phone]       OFFICE FOLLOW UP NOTE  [name] Date of Birth:  1946/07/30 Medical Record Number:  [id number]   Reason for Referral:  hospital TIA follow up  CHIEF COMPLAINT:  Chief Complaint  Patient presents with  . Follow-up    pt with wife, room 10. pt hasnt had any problems since being dc home, he feels hat energy is lower but other then that back to baseline. still wearing a heart monitor and wears it for a total for 30 days.     HPI: Jorge [name] is being seen today for initial visit in the office for TIA on 11/18/17. History obtained from patient and chart review. Reviewed all radiology images and labs personally.  [name] is a 72 y.o. male with history of DM, HLD and HTN, who presented with slurred speech and R sided weakness following a fall in the shower on 11/18/2017.   CT scan reviewed and showed no acute abnormality.  MRI head showed left insular and mesial temporal lobe hyperintensity possibly related to previous stroke versus seizure.  MRA of the neck showed narrowing of the left ICA skull base and MRA head was normal.  2D echo showed an EF of 65-70% without source of embolus.  EEG was performed and did show diffuse slowing of the waking background but negative for seizure activity.  LDL 114 and A1c 6.6.  Patient was previously on aspirin 325 and recommended aspirin 81 and Plavix 75 for 3 weeks then Plavix alone.  Patient was previously on Zocor 20 mg and this was increased to 40 mg.  Recommended for patient to obtain a 30-day cardiac monitor as outpatient by Dr. Erlinda Hong is remote strokes have a potential for embolic pattern.  Discharged home with wife without need of therapies.  Since discharge, patient has been doing well and is accompanied at today's appointment with his wife.  He continues to take Plavix with mild bruising but no bleeding.  He continues to take Zocor without myalgias.  He did  have a 30-day cardiac monitor ordered by his PCP which was placed on 12/14/2017.  Started on Lexapro by his PCP on 12/01/2017 for depression and anxiety.  He states after the first couple weeks after his stroke, he did not want to do anything or get off the couch or leave the house in fear of having an additional stroke.  He feels that things have been already improving on the Lexapro when he is continuing to get back to his normal routines. His wife was questioning whether this dosage needs to be increased but it was informed that this medication could take up to 4-6 weeks to see full effect.  If he continues to have depression or anxiety after this time, advised him to call PCP for possible increase.  Today's blood pressure is mildly elevated at 153/84 but he does monitor this at home and typically runs 130s/80s.  Denies new or worsening stroke/TIA symptoms.    ROS:   14 system review of systems performed and negative with exception of depression, anxiety and joint pain  PMH:  Past Medical History:  Diagnosis Date  . Diabetes mellitus without complication ([place])   . Environmental allergies    Hx: of  . GERD (gastroesophageal reflux disease)   . Hypertension   . Numbness and tingling    Hx; of  Right shoulder    PSH:  Past Surgical History:  Procedure Laterality Date  . ANTERIOR CERVICAL DECOMP/DISCECTOMY FUSION N/A 03/17/2013   Procedure: ANTERIOR CERVICAL DECOMPRESSION/DISCECTOMY FUSION 2 LEVEL C5-7;  Surgeon: [name], MD;  Location: [facility];  Service: Orthopedics;  Laterality: N/A;  . COLONOSCOPY     Hx: of  . HERNIA REPAIR    . TONSILLECTOMY      Social History:  Social History   Socioeconomic History  . Marital status: Married    Spouse name: Not on file  . Number of children: Not on file  . Years of education: Not on file  . Highest education level: Not on file  Occupational History  . Not on file  Social Needs  . Financial resource strain: Not on file  . Food  insecurity:    Worry: Not on file    Inability: Not on file  . Transportation needs:    Medical: Not on file    Non-medical: Not on file  Tobacco Use  . Smoking status: Former Smoker    Types: Cigars  . Smokeless tobacco: Never Used  Substance and Sexual Activity  . Alcohol use: No  . Drug use: No  . Sexual activity: Not on file  Lifestyle  . Physical activity:    Days per week: Not on file    Minutes per session: Not on file  . Stress: Not on file  Relationships  . Social connections:    Talks on phone: Not on file    Gets together: Not on file    Attends religious service: Not on file    Active member of club or organization: Not on file    Attends meetings of clubs or organizations: Not on file    Relationship status: Not on file  . Intimate partner violence:    Fear of current or ex partner: Not on file    Emotionally abused: Not on file    Physically abused: Not on file    Forced sexual activity: Not on file  Other Topics Concern  . Not on file  Social History Narrative  . Not on file    Family History:  Family History  Problem Relation Age of Onset  . Diabetes Brother     Medications:   Current Outpatient Medications on File Prior to Visit  Medication Sig Dispense Refill  . carvedilol (COREG) 12.5 MG tablet Take 12.5 mg by mouth 2 (two) times daily with a meal.    . Cetirizine HCl (ZYRTEC PO) Take 1 tablet by mouth daily as needed (allergies).    . clopidogrel (PLAVIX) 75 MG tablet Take 1 tablet (75 mg total) by mouth daily. 30 tablet 0  . Cyanocobalamin (VITAMIN B-12 IJ) Inject 1 Applicatorful as directed every 30 (thirty) days.    Marland Kitchen escitalopram (LEXAPRO) 5 MG tablet Take 5 mg by mouth daily.    Marland Kitchen glipiZIDE (GLUCOTROL XL) 10 MG 24 hr tablet Take 10 mg by mouth daily with breakfast.    . linagliptin (TRADJENTA) 5 MG TABS tablet Take 5 mg by mouth daily with breakfast.    . losartan (COZAAR) 50 MG tablet Take 50 mg by mouth daily with breakfast.    .  metFORMIN (GLUCOPHAGE-XR) 500 MG 24 hr tablet Take 500 mg by mouth 2 (two) times daily with a meal.    . methocarbamol (ROBAXIN) 500 MG tablet Take 1 tablet (500 mg total) by mouth 3 (three) times daily as needed. (Patient taking differently: Take 500 mg by mouth 3 (three) times daily as needed for muscle  spasms. ) 60 tablet 0  . pioglitazone (ACTOS) 30 MG tablet Take 30 mg by mouth daily.    . simvastatin (ZOCOR) 20 MG tablet Take 2 tablets (40 mg total) by mouth at bedtime. 30 tablet 0   No current facility-administered medications on file prior to visit.     Allergies:  No Known Allergies   Physical Exam  Vitals:   12/21/17 0927  BP: (!) 153/84  Pulse: 61  Weight: 222 lb (100.7 kg)  Height: 5\' 11"  (1.803 m)   Body mass index is 30.96 kg/m. No exam data present  General: well developed, pleasant elderly Caucasian male, well nourished, seated, in no evident distress Head: head normocephalic and atraumatic.   Neck: supple with no carotid or supraclavicular bruits Cardiovascular: regular rate and rhythm, no murmurs Musculoskeletal: no deformity Skin:  no rash/petichiae Vascular:  Normal pulses all extremities  Neurologic Exam Mental Status: Awake and fully alert. Oriented to place and time. Recent and remote memory intact. Attention span, concentration and fund of knowledge appropriate. Mood and affect appropriate.  Cranial Nerves: Fundoscopic exam reveals sharp disc margins. Pupils equal, briskly reactive to light. Extraocular movements full without nystagmus. Visual fields full to confrontation. Hearing intact. Facial sensation intact. Face, tongue, palate moves normally and symmetrically.  Motor: Normal bulk and tone. Normal strength in all tested extremity muscles. Sensory.: intact to touch , pinprick , position and vibratory sensation.  Coordination: Rapid alternating movements normal in all extremities. Finger-to-nose and heel-to-shin performed accurately bilaterally. Gait  and Station: Arises from chair without difficulty. Stance is normal. Gait demonstrates normal stride length and balance . Able to heel, toe and tandem walk without difficulty.  Reflexes: 1+ and symmetric. Toes downgoing.    NIHSS  0 Modified Rankin  1    Diagnostic Data (Labs, Imaging, Testing)  CT head Study date: 11/18/2017 IMPRESSION: 1. No acute intracranial hemorrhage. 2. Mild age-related atrophy and chronic microvascular ischemic Changes.  MRI brain MRA head/neck Study date: 11/19/2017 IMPRESSION: 1. Hyperintense T2-weighted signal within the left insula and medial temporal lobe. While this may be a sequela of remote ischemia, there appears to be mild gyral edema rather than encephalomalacia. This pattern may be seen in multiple encephalitides, the most concerning of which is herpes encephalitis. If there is any clinical suspicion of herpes encephalitis, CSF sampling and empiric treatment should be considered. 2. Moderate narrowing of the left internal carotid artery skull base lacerum segment. Otherwise normal MRA of the head. 3. No hemodynamically significant stenosis of the cervical carotid or vertebral arteries.  EEG Study date: 11/19/2017 Impression: This awake and drowsy EEG is abnormal due to diffuse slowing of the waking background.  2D echo Study date: 11/20/2017 Study Conclusions - Left ventricle: The cavity size was normal. There was mild   concentric hypertrophy. Systolic function was vigorous. The   estimated ejection fraction was in the range of 65% to 70%. Wall   motion was normal; there were no regional wall motion   abnormalities. Doppler parameters are consistent with abnormal   left ventricular relaxation (grade 1 diastolic dysfunction).   There was no evidence of elevated ventricular filling pressure by   Doppler parameters. - Mitral valve: There was trivial regurgitation. - Right ventricle: The cavity size was normal. Wall thickness was    normal. Systolic function was normal. - Tricuspid valve: There was no significant regurgitation. - Pulmonary arteries: Systolic pressure could not be accurately   estimated. - Inferior vena cava: The vessel was normal in  size. Impressions: - No prior study available for comparison.    ASSESSMENT: Ledford Goodson is a 72 y.o. year old male here with TIA on 11/18/2017. Vascular risk factors include DM, HLD and HTN.    PLAN: -Continue clopidogrel 75 mg daily  and Zocor for secondary stroke prevention -F/u with PCP regarding your HLD, HTN and DM management -continue to monitor BP at home -Continue to stay active and eat a healthy diet -Maintain strict control of hypertension with blood pressure goal below 130/90, diabetes with hemoglobin A1c goal below 6.5% and cholesterol with LDL cholesterol (bad cholesterol) goal below 70 mg/dL. I also advised the patient to eat a healthy diet with plenty of whole grains, cereals, fruits and vegetables, exercise regularly and maintain ideal body weight.  Follow up in 5 months or call earlier if needed   Greater than 50% time during this 25 minute consultation visit was spent on counseling and coordination of care about HLD, HTN and DM, discussion about risk benefit of anticoagulation and answering questions.   Venancio Poisson, AGNP-BC  [facility] [address] [place], [place] [address]  Phone [phone] Fax [phone]

## 2017-12-21 NOTE — Patient Instructions (Signed)
Continue clopidogrel 75 mg daily  and zocor  for secondary stroke prevention  Continue to monitor blood pressure at home  Continue to follow up with PCP regarding blood pressure, cholesterol and diabetes management  Continue to stay active and eat a healthy diet  Maintain strict control of hypertension with blood pressure goal below 130/90, diabetes with hemoglobin A1c goal below 6.5% and cholesterol with LDL cholesterol (bad cholesterol) goal below 70 mg/dL. I also advised the patient to eat a healthy diet with plenty of whole grains, cereals, fruits and vegetables, exercise regularly and maintain ideal body weight.  Followup in the future with me in 5 months or call earlier if needed

## 2017-12-21 NOTE — Progress Notes (Signed)
I reviewed above note and agree with the assessment and plan.   Rosalin Hawking, MD PhD Stroke Neurology 12/21/2017 10:50 PM

## 2018-01-26 DIAGNOSIS — D638 Anemia in other chronic diseases classified elsewhere: Secondary | ICD-10-CM | POA: Insufficient documentation

## 2018-01-28 ENCOUNTER — Telehealth: Payer: Self-pay

## 2018-01-28 NOTE — Telephone Encounter (Signed)
Notes recorded by Marval Regal, RN on 01/28/2018 at 10:09 AM EDT Rn call patient that the heart monitor was negative for any irregular heart beat. Continue treatment plan. Pt verbalized understanding. ------

## 2018-01-28 NOTE — Telephone Encounter (Signed)
-----   Message from Rosalin Hawking, MD sent at 01/27/2018 11:26 PM EDT ----- Could you please let the patient know that the heart monitoring test done recently was negative for irregular heart beats that account for the stroke. Please continue current treatment. Thanks.  Rosalin Hawking, MD PhD Stroke Neurology 01/27/2018 11:24 PM

## 2018-05-24 NOTE — Progress Notes (Signed)
[facility] [address] [place]. [place] [address] [phone]       OFFICE FOLLOW UP NOTE  Jorge [name] Date of Birth:  1946/01/30 Medical Record Number:  [id number]   Reason for Referral:  hospital TIA follow up  CHIEF COMPLAINT:  Chief Complaint  Patient presents with  . Follow-up    5 month follow up. Rm 9. Wife accompanied patient to his appointment. No new changes or concerns.     HPI: Jorge [name] is being seen today in the office for TIA on 11/18/17. History obtained from patient and chart review. Reviewed all radiology images and labs personally.  Jorge [name] is a 72 y.o. male with history of DM, HLD and HTN, who presented with slurred speech and R sided weakness following a fall in the shower on 11/18/2017.   CT scan reviewed and showed no acute abnormality.  MRI head showed left insular and mesial temporal lobe hyperintensity possibly related to previous stroke versus seizure.  MRA of the neck showed narrowing of the left ICA skull base and MRA head was normal.  2D echo showed an EF of 65-70% without source of embolus.  EEG was performed and did show diffuse slowing of the waking background but negative for seizure activity.  LDL 114 and A1c 6.6.  Patient was previously on aspirin 325 and recommended aspirin 81 and Plavix 75 for 3 weeks then Plavix alone.  Patient was previously on Zocor 20 mg and this was increased to 40 mg.  Recommended for patient to obtain a 30-day cardiac monitor as outpatient by Dr. Erlinda Hong is remote strokes have a potential for embolic pattern.  Discharged home with wife without need of therapies.  12/21/2017 visit: Since discharge, patient has been doing well and is accompanied at today's appointment with his wife.  He continues to take Plavix with mild bruising but no bleeding.  He continues to take Zocor without myalgias.  He did have a 30-day cardiac monitor ordered by his PCP which was placed on 12/14/2017.  Started on Lexapro by his PCP  on 12/01/2017 for depression and anxiety.  He states after the first couple weeks after his stroke, he did not want to do anything or get off the couch or leave the house in fear of having an additional stroke.  He feels that things have been already improving on the Lexapro when he is continuing to get back to his normal routines. His wife was questioning whether this dosage needs to be increased but it was informed that this medication could take up to 4-6 weeks to see full effect.  If he continues to have depression or anxiety after this time, advised him to call PCP for possible increase.  Today's blood pressure is mildly elevated at 153/84 but he does monitor this at home and typically runs 130s/80s.  Denies new or worsening stroke/TIA symptoms.   Interval history 05/25/2018: Patient is being seen today for scheduled follow-up visit and is accompanied by his wife.  He underwent a 30-day cardiac monitor which was negative for atrial fibrillation.  He states overall he has been doing well without residual deficits or recurring of symptoms.  Continues to take Plavix with mild bruising but no bleeding.  Continues to take Zocor without side effects myalgias.  Blood pressure today satisfactory 129/79.  Also states glucose levels have been stable.  He continues to exercise along with staying active.  He has since stopped Lexapro and denies continuing symptoms of depression.  Denies new or worsening stroke/TIA symptoms.   ROS:   14 system review of systems performed and negative with exception of d cough, wheezing and bruise easily  PMH:  Past Medical History:  Diagnosis Date  . Diabetes mellitus without complication ([place])   . Environmental allergies    Hx: of  . GERD (gastroesophageal reflux disease)   . Hypertension   . Numbness and tingling    Hx; of  Right shoulder    PSH:  Past Surgical History:  Procedure Laterality Date  . ANTERIOR CERVICAL DECOMP/DISCECTOMY FUSION N/A 03/17/2013    Procedure: ANTERIOR CERVICAL DECOMPRESSION/DISCECTOMY FUSION 2 LEVEL C5-7;  Surgeon: [name], MD;  Location: [facility];  Service: Orthopedics;  Laterality: N/A;  . COLONOSCOPY     Hx: of  . HERNIA REPAIR    . TONSILLECTOMY      Social History:  Social History   Socioeconomic History  . Marital status: Married    Spouse name: Not on file  . Number of children: Not on file  . Years of education: Not on file  . Highest education level: Not on file  Occupational History  . Not on file  Social Needs  . Financial resource strain: Not on file  . Food insecurity:    Worry: Not on file    Inability: Not on file  . Transportation needs:    Medical: Not on file    Non-medical: Not on file  Tobacco Use  . Smoking status: Former Smoker    Types: Cigars  . Smokeless tobacco: Never Used  Substance and Sexual Activity  . Alcohol use: No  . Drug use: No  . Sexual activity: Not on file  Lifestyle  . Physical activity:    Days per week: Not on file    Minutes per session: Not on file  . Stress: Not on file  Relationships  . Social connections:    Talks on phone: Not on file    Gets together: Not on file    Attends religious service: Not on file    Active member of club or organization: Not on file    Attends meetings of clubs or organizations: Not on file    Relationship status: Not on file  . Intimate partner violence:    Fear of current or ex partner: Not on file    Emotionally abused: Not on file    Physically abused: Not on file    Forced sexual activity: Not on file  Other Topics Concern  . Not on file  Social History Narrative  . Not on file    Family History:  Family History  Problem Relation Age of Onset  . Diabetes Brother     Medications:   Current Outpatient Medications on File Prior to Visit  Medication Sig Dispense Refill  . carvedilol (COREG) 12.5 MG tablet Take 12.5 mg by mouth 2 (two) times daily with a meal.    . Cetirizine HCl (ZYRTEC PO) Take 1  tablet by mouth daily as needed (allergies).    . clopidogrel (PLAVIX) 75 MG tablet Take 1 tablet (75 mg total) by mouth daily. 30 tablet 0  . Cyanocobalamin (VITAMIN B-12 IJ) Inject 1 Applicatorful as directed every 30 (thirty) days.    Marland Kitchen glipiZIDE (GLUCOTROL XL) 10 MG 24 hr tablet Take 10 mg by mouth daily with breakfast.    . linagliptin (TRADJENTA) 5 MG TABS tablet Take 5 mg by mouth daily with breakfast.    . losartan (COZAAR) 50  MG tablet Take 50 mg by mouth daily with breakfast.    . metFORMIN (GLUCOPHAGE-XR) 500 MG 24 hr tablet Take 250 mg by mouth 2 (two) times daily with a meal.     . simvastatin (ZOCOR) 20 MG tablet Take 2 tablets (40 mg total) by mouth at bedtime. 30 tablet 0   No current facility-administered medications on file prior to visit.     Allergies:  No Known Allergies   Physical Exam  Vitals:   05/25/18 0946  BP: 129/79  Pulse: 68  Weight: 216 lb 3.2 oz (98.1 kg)  Height: 5\' 11"  (1.803 m)   Body mass index is 30.15 kg/m. No exam data present  General: well developed, pleasant elderly Caucasian male, well nourished, seated, in no evident distress Head: head normocephalic and atraumatic.   Neck: supple with no carotid or supraclavicular bruits Cardiovascular: regular rate and rhythm, no murmurs Musculoskeletal: no deformity Skin:  no rash/petichiae Vascular:  Normal pulses all extremities  Neurologic Exam Mental Status: Awake and fully alert. Oriented to place and time. Recent and remote memory intact. Attention span, concentration and fund of knowledge appropriate. Mood and affect appropriate.  Cranial Nerves: Fundoscopic exam reveals sharp disc margins. Pupils equal, briskly reactive to light. Extraocular movements full without nystagmus. Visual fields full to confrontation. Hearing intact. Facial sensation intact. Face, tongue, palate moves normally and symmetrically.  Motor: Normal bulk and tone. Normal strength in all tested extremity  muscles. Sensory.: intact to touch , pinprick , position and vibratory sensation.  Coordination: Rapid alternating movements normal in all extremities. Finger-to-nose and heel-to-shin performed accurately bilaterally. Gait and Station: Arises from chair without difficulty. Stance is normal. Gait demonstrates normal stride length and balance . Able to heel, toe and tandem walk without difficulty.  Reflexes: 1+ and symmetric. Toes downgoing.     Diagnostic Data (Labs, Imaging, Testing)  CT head Study date: 11/18/2017 IMPRESSION: 1. No acute intracranial hemorrhage. 2. Mild age-related atrophy and chronic microvascular ischemic Changes.  MRI brain MRA head/neck Study date: 11/19/2017 IMPRESSION: 1. Hyperintense T2-weighted signal within the left insula and medial temporal lobe. While this may be a sequela of remote ischemia, there appears to be mild gyral edema rather than encephalomalacia. This pattern may be seen in multiple encephalitides, the most concerning of which is herpes encephalitis. If there is any clinical suspicion of herpes encephalitis, CSF sampling and empiric treatment should be considered. 2. Moderate narrowing of the left internal carotid artery skull base lacerum segment. Otherwise normal MRA of the head. 3. No hemodynamically significant stenosis of the cervical carotid or vertebral arteries.  EEG Study date: 11/19/2017 Impression: This awake and drowsy EEG is abnormal due to diffuse slowing of the waking background.  2D echo Study date: 11/20/2017 Study Conclusions - Left ventricle: The cavity size was normal. There was mild   concentric hypertrophy. Systolic function was vigorous. The   estimated ejection fraction was in the range of 65% to 70%. Wall   motion was normal; there were no regional wall motion   abnormalities. Doppler parameters are consistent with abnormal   left ventricular relaxation (grade 1 diastolic dysfunction).   There was no  evidence of elevated ventricular filling pressure by   Doppler parameters. - Mitral valve: There was trivial regurgitation. - Right ventricle: The cavity size was normal. Wall thickness was   normal. Systolic function was normal. - Tricuspid valve: There was no significant regurgitation. - Pulmonary arteries: Systolic pressure could not be accurately   estimated. -  Inferior vena cava: The vessel was normal in size. Impressions: - No prior study available for comparison.    ASSESSMENT: Emmet Messer is a 72 y.o. year old male here with TIA on 11/18/2017. Vascular risk factors include DM, HLD and HTN.  Patient is being seen today for follow-up appointment and overall continues to do well from a stroke standpoint without residual deficits or recurrence of symptoms.   PLAN: -Continue clopidogrel 75 mg daily  and Zocor for secondary stroke prevention -F/u with PCP regarding your HLD, HTN and DM management -request lipid panel repeat at next follow-up appointment with PCP to ensure adequate dosage of Zocor to manage HLD -continue to monitor BP at home -Continue to stay active and eat a healthy diet -Maintain strict control of hypertension with blood pressure goal below 130/90, diabetes with hemoglobin A1c goal below 6.5% and cholesterol with LDL cholesterol (bad cholesterol) goal below 70 mg/dL. I also advised the patient to eat a healthy diet with plenty of whole grains, cereals, fruits and vegetables, exercise regularly and maintain ideal body weight.  Follow up in 6 months or call earlier if needed   Greater than 50% time during this 25 minute consultation visit was spent on counseling and coordination of care about HLD, HTN and DM, discussion about risk benefit of anticoagulation and answering questions.   Venancio Poisson, AGNP-BC  [facility] [address] [place], [place] [address]  Phone [phone] Fax [phone]

## 2018-05-25 ENCOUNTER — Ambulatory Visit (INDEPENDENT_AMBULATORY_CARE_PROVIDER_SITE_OTHER): Payer: Medicare Other | Admitting: Adult Health

## 2018-05-25 ENCOUNTER — Encounter: Payer: Self-pay | Admitting: Adult Health

## 2018-05-25 VITALS — BP 129/79 | HR 68 | Ht 71.0 in | Wt 216.2 lb

## 2018-05-25 DIAGNOSIS — E785 Hyperlipidemia, unspecified: Secondary | ICD-10-CM | POA: Diagnosis not present

## 2018-05-25 DIAGNOSIS — I10 Essential (primary) hypertension: Secondary | ICD-10-CM | POA: Diagnosis not present

## 2018-05-25 DIAGNOSIS — E119 Type 2 diabetes mellitus without complications: Secondary | ICD-10-CM

## 2018-05-25 DIAGNOSIS — G459 Transient cerebral ischemic attack, unspecified: Secondary | ICD-10-CM

## 2018-05-25 NOTE — Patient Instructions (Signed)
Continue clopidogrel 75 mg daily  and simvastatin  for secondary stroke prevention  Continue to follow up with PCP regarding cholesterol, blood pressure and diabetes management   Continue to stay active and maintain a healthy diet  Continue to monitor blood pressure at home  Maintain strict control of hypertension with blood pressure goal below 130/90, diabetes with hemoglobin A1c goal below 6.5% and cholesterol with LDL cholesterol (bad cholesterol) goal below 70 mg/dL. I also advised the patient to eat a healthy diet with plenty of whole grains, cereals, fruits and vegetables, exercise regularly and maintain ideal body weight.  Followup in the future with me in 6 months or call earlier if needed       Thank you for coming to see [place] at [facility]. I hope we have been able to provide you high quality care today.  You may receive a patient satisfaction survey over the next few weeks. We would appreciate your feedback and comments so that we may continue to improve ourselves and the health of our patients.

## 2018-05-25 NOTE — Progress Notes (Signed)
I agree with the above plan 

## 2018-09-30 IMAGING — CT CT HEAD W/O CM
4 series · 16 of 47 positions shown, 18 images · non-contrast
Comparison: Head CT dated 04/05/2004

CLINICAL DATA: 71-year-old male with TIA.

EXAM:
CT HEAD WITHOUT CONTRAST
TECHNIQUE: Contiguous axial images were obtained from the base of the skull
through the vertex without intravenous contrast.

[Series 3: head without · axial · non-contrast · 0.42mm/px · z∈[-149,-19]mm · 7 of 36 slices shown, 9 images]
[im 5/36  brain]
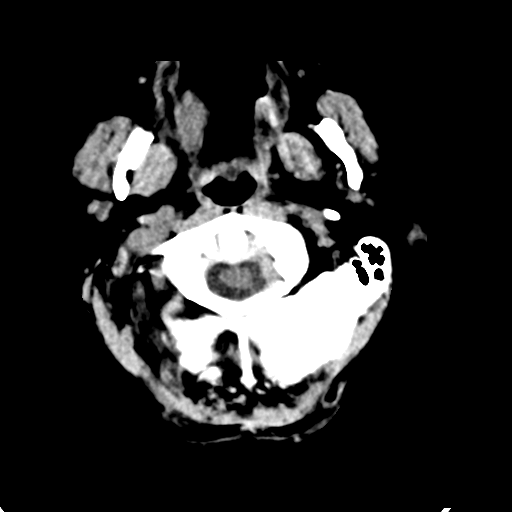
[im 5/36  bone]
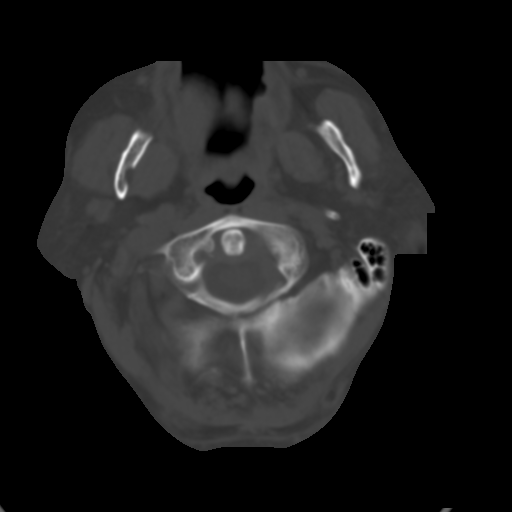
[im 9/36  brain]
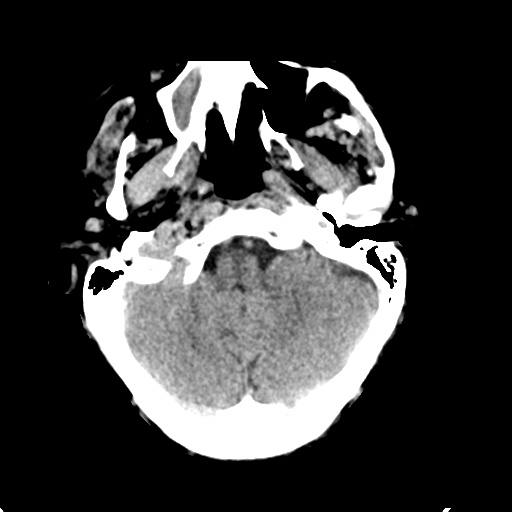
[im 14/36  brain]
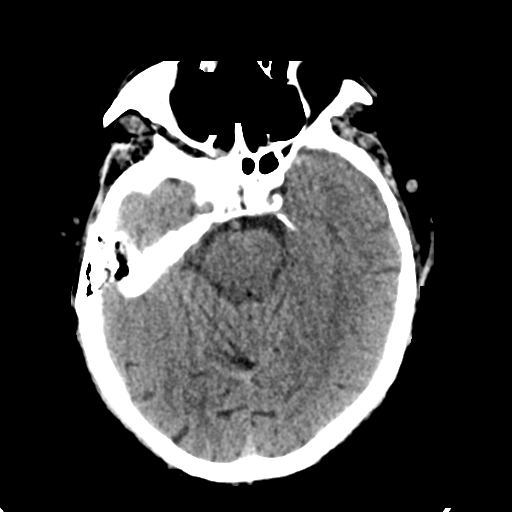
[im 18/36  brain]
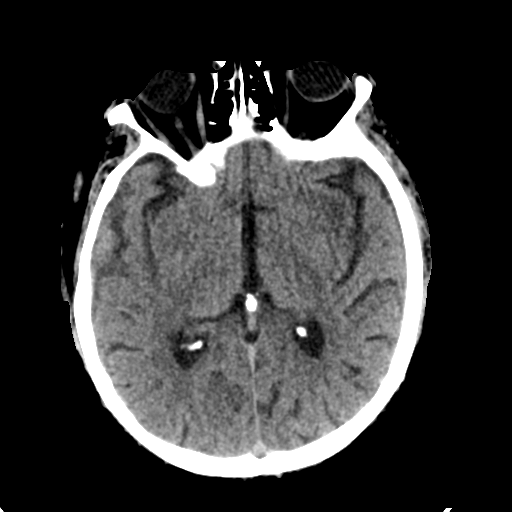
[im 22/36  brain]
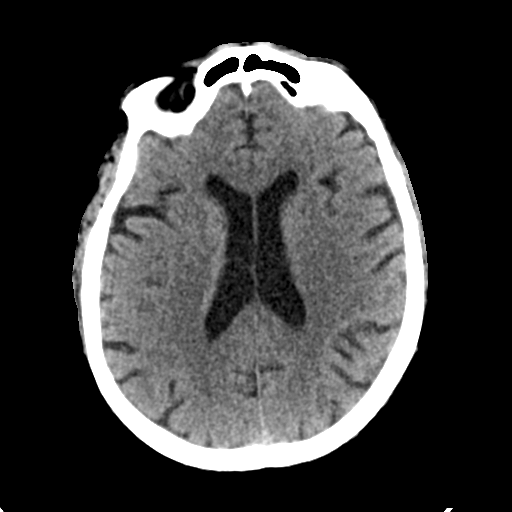
[im 22/36  bone]
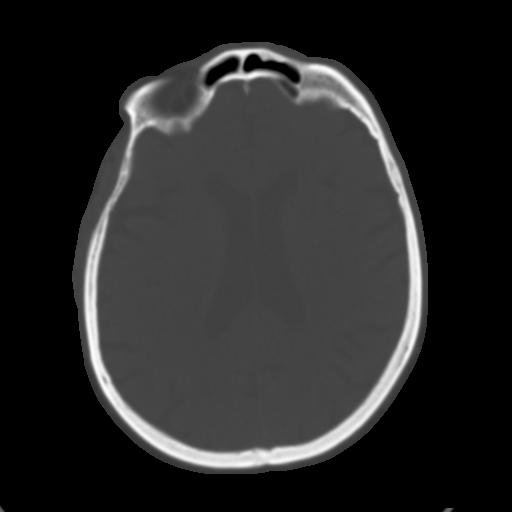
[im 27/36  brain]
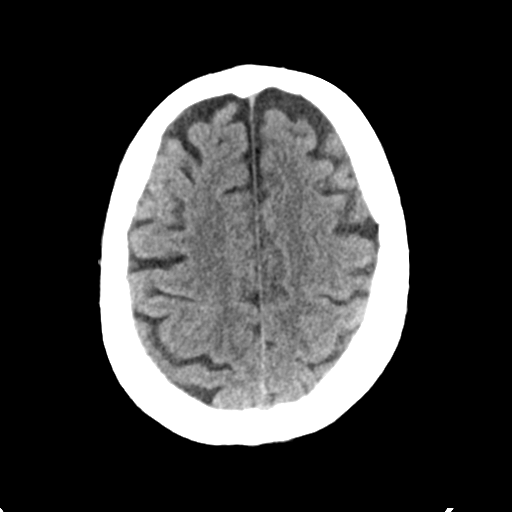
[im 31/36  brain]
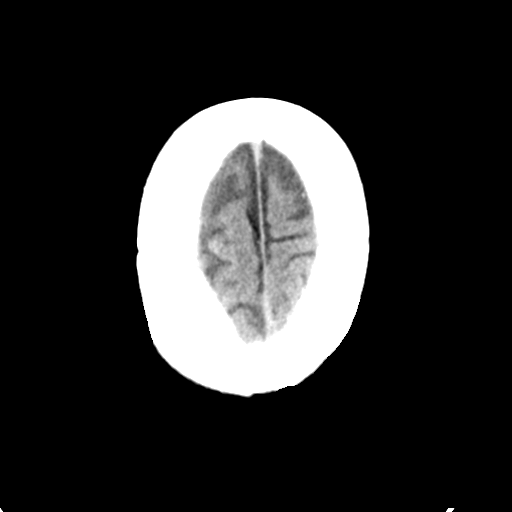

[Series 4: head bone · axial · 0.42mm/px · z∈[-153,-117]mm · 3 of 89 slices shown]
[im 9/89  bone]
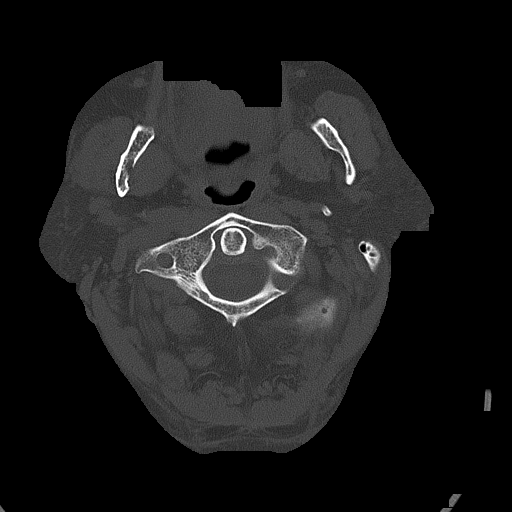
[im 18/89  bone]
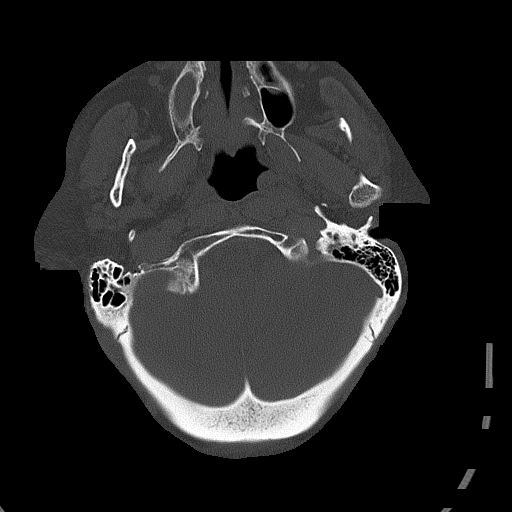
[im 27/89  bone]
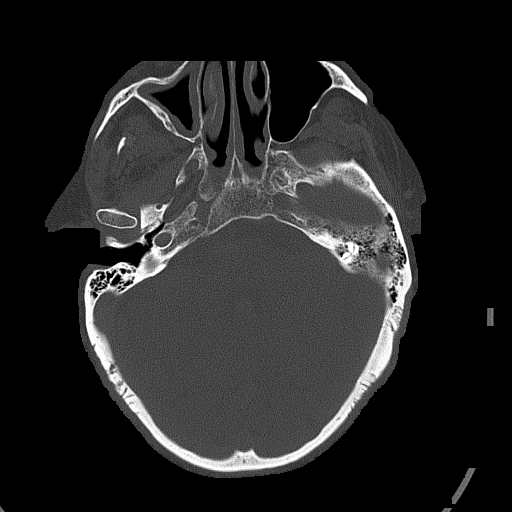

[Series 5: head without cor · coronal · non-contrast · 0.35mm/px · 3 of 69 slices shown]
[im 23/69  brain]
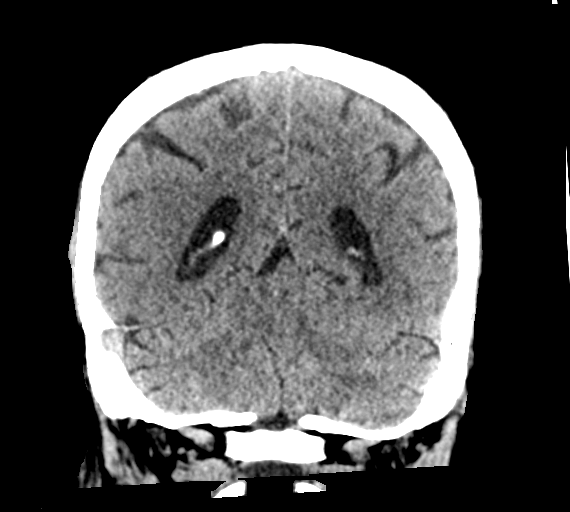
[im 31/69  brain]
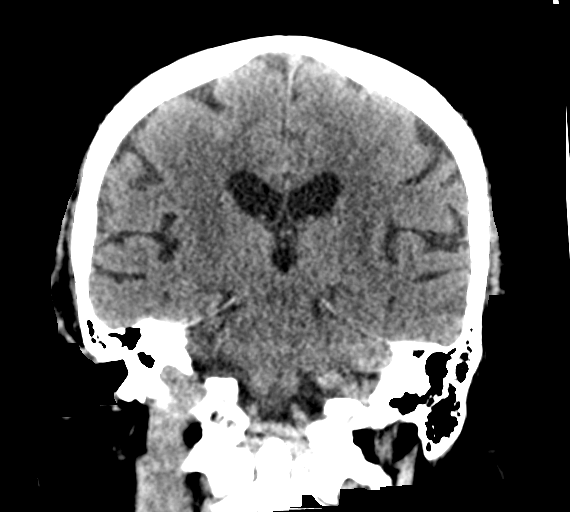
[im 38/69  brain]
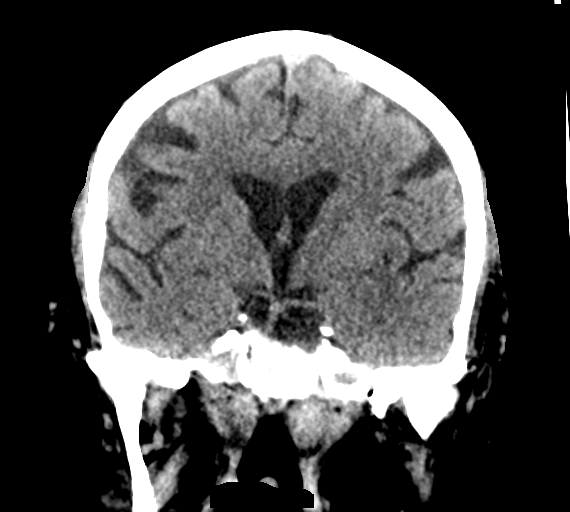

[Series 6: head without sag · sagittal · non-contrast · 0.35mm/px · 3 of 60 slices shown]
[im 20/60  brain]
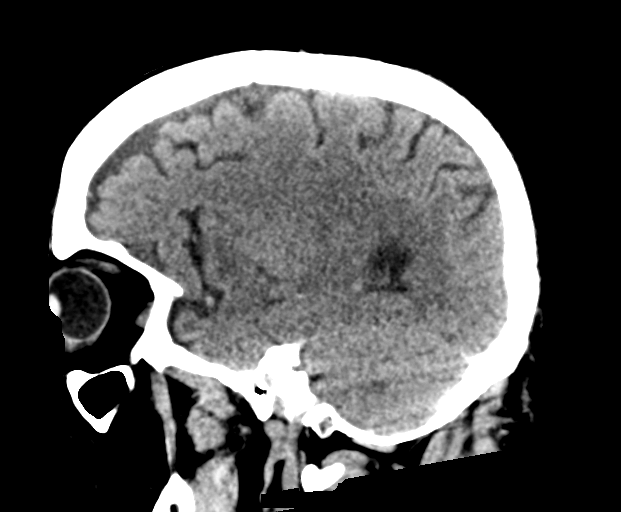
[im 30/60  brain]
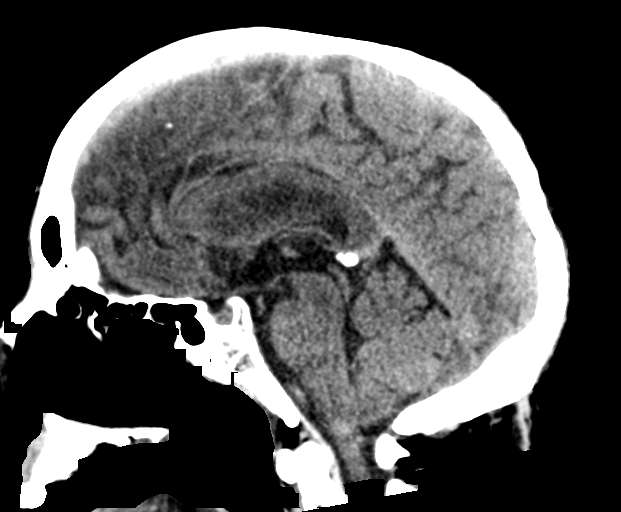
[im 40/60  brain]
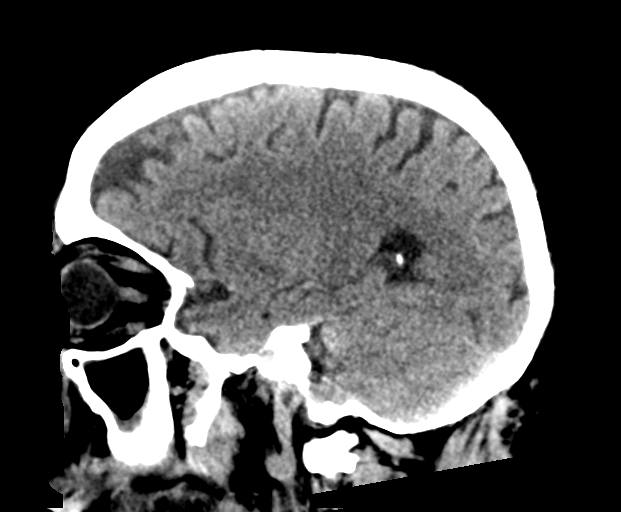

[16 of 47 positions shown; findings below may reference images not displayed]

FINDINGS: Brain: The ventricles and sulci appropriate size for patient's age.
Mild periventricular and deep white matter chronic microvascular
ischemic changes noted. There is no acute intracranial hemorrhage.
No mass effect or midline shift. No extra-axial fluid collection.

Vascular: No hyperdense vessel or unexpected calcification.

Skull: Normal. Negative for fracture or focal lesion.

Sinuses/Orbits: There is diffuse mucoperiosteal thickening of
paranasal sinuses. The mastoid air cells are clear.

Other: None
IMPRESSION: 1. No acute intracranial hemorrhage.
2. Mild age-related atrophy and chronic microvascular ischemic
changes.

## 2018-10-01 IMAGING — DX DG CHEST 1V PORT
1 series · 1 of 1 positions shown · non-contrast
Comparison: Chest radiograph dated 03/16/2013

CLINICAL DATA: 71-year-old male with shortness of breath.

EXAM:
PORTABLE CHEST 1 VIEW

[chest]
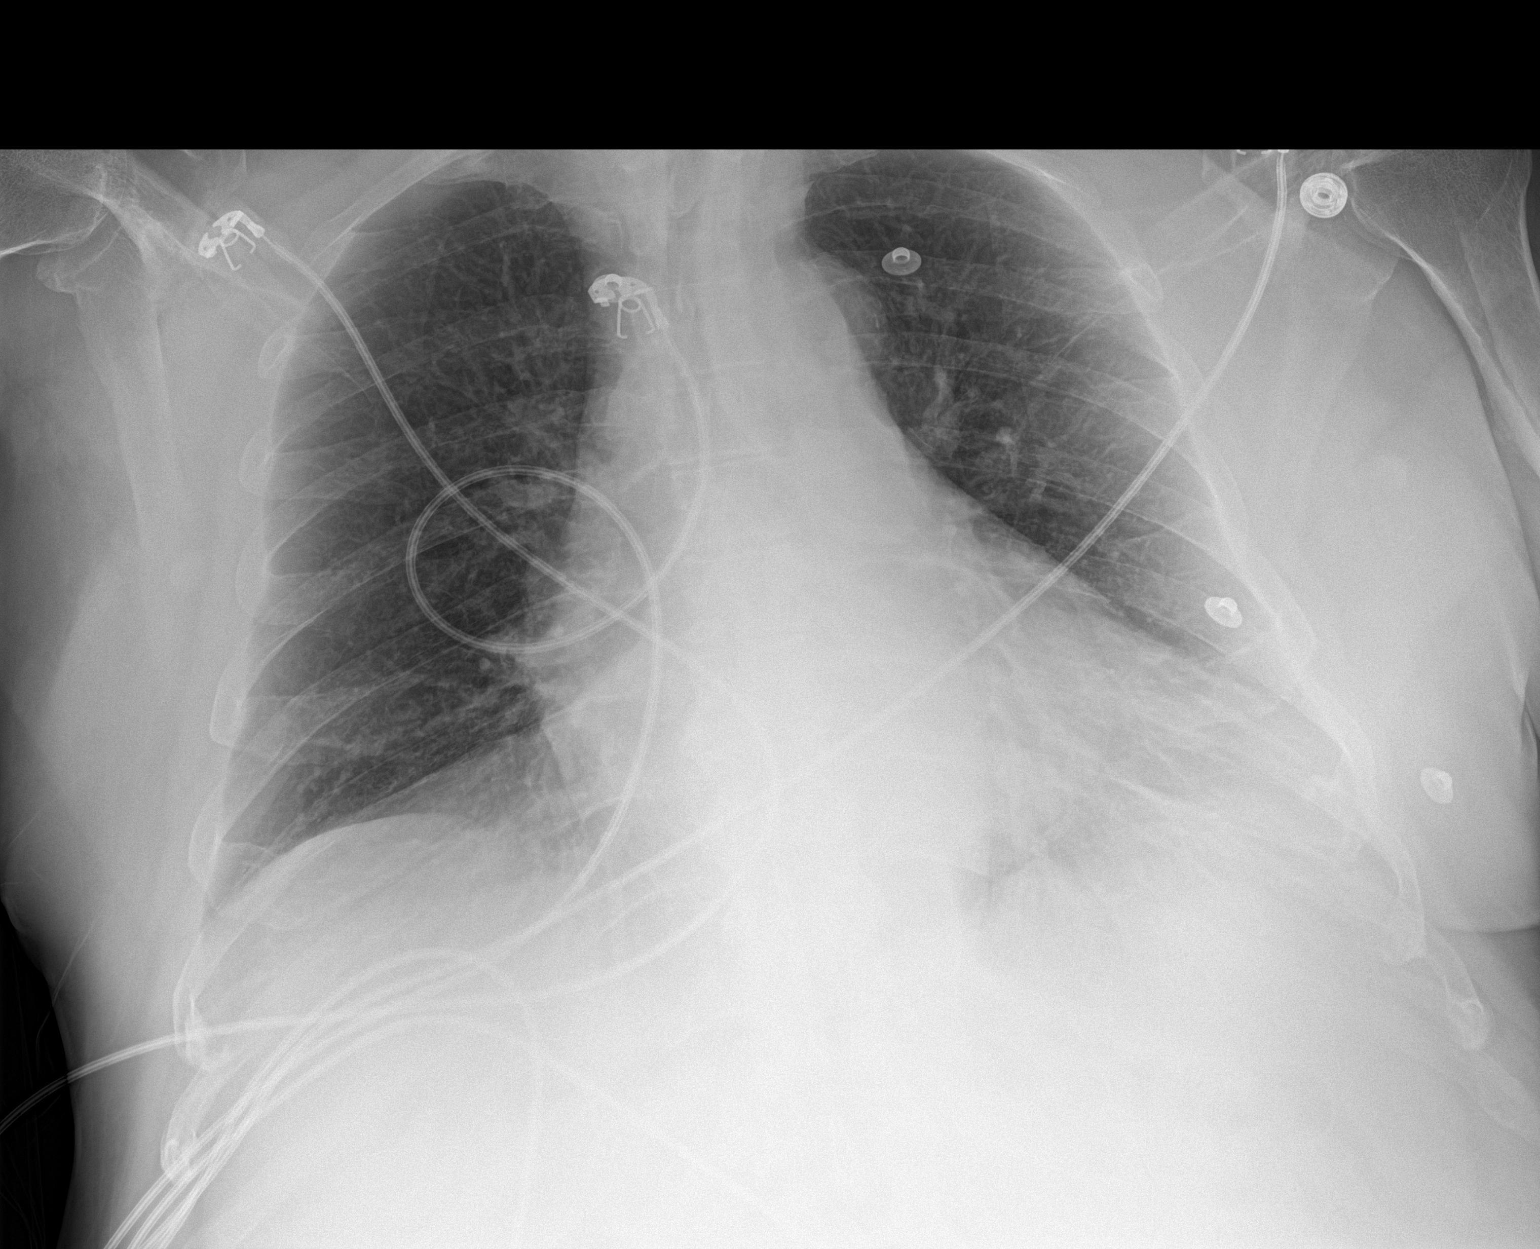

[1 of 1 positions shown; findings below may reference images not displayed]

FINDINGS: Minimal left lung base atelectatic changes. No focal consolidation,
pleural effusion, or pneumothorax. The cardiac silhouette is within
normal limits. No acute osseous pathology.
IMPRESSION: No active disease.

## 2018-11-23 ENCOUNTER — Ambulatory Visit: Payer: Medicare Other | Admitting: Adult Health

## 2019-05-17 ENCOUNTER — Ambulatory Visit: Payer: Self-pay | Admitting: Adult Health

## 2019-08-23 ENCOUNTER — Other Ambulatory Visit: Payer: Self-pay

## 2019-08-23 ENCOUNTER — Ambulatory Visit (INDEPENDENT_AMBULATORY_CARE_PROVIDER_SITE_OTHER): Payer: Medicare Other | Admitting: Adult Health

## 2019-08-23 ENCOUNTER — Encounter: Payer: Self-pay | Admitting: Adult Health

## 2019-08-23 VITALS — BP 158/88 | HR 69 | Temp 97.8°F | Ht 71.0 in | Wt 209.2 lb

## 2019-08-23 DIAGNOSIS — E785 Hyperlipidemia, unspecified: Secondary | ICD-10-CM

## 2019-08-23 DIAGNOSIS — E119 Type 2 diabetes mellitus without complications: Secondary | ICD-10-CM | POA: Diagnosis not present

## 2019-08-23 DIAGNOSIS — G459 Transient cerebral ischemic attack, unspecified: Secondary | ICD-10-CM | POA: Diagnosis not present

## 2019-08-23 DIAGNOSIS — I10 Essential (primary) hypertension: Secondary | ICD-10-CM

## 2019-08-23 NOTE — Progress Notes (Signed)
I agree with the above plan 

## 2019-08-23 NOTE — Progress Notes (Signed)
[facility] [address] [place]. [place] [address] [phone]       OFFICE FOLLOW UP NOTE  Mr. Cylar Lapa Date of Birth:  1946/02/02 Medical Record Number:  [id number]   Reason for Referral: TIA follow up  CHIEF COMPLAINT:  Chief Complaint  Patient presents with  . Follow-up    Yearly f/u. Alone. Rm 9. No new concerns at this time.     HPI: Hospital admission 11/18/2017: Mr. Jacori Nagarajan is a 73 y.o. male with history of DM, HLD and HTN, who presented with slurred speech and R sided weakness following a fall in the shower on 11/18/2017.   CT scan reviewed and showed no acute abnormality.  MRI head showed left insular and mesial temporal lobe hyperintensity possibly related to previous stroke versus seizure.  MRA of the neck showed narrowing of the left ICA skull base and MRA head was normal.  2D echo showed an EF of 65-70% without source of embolus.  EEG was performed and did show diffuse slowing of the waking background but negative for seizure activity.  LDL 114 and A1c 6.6.  Patient was previously on aspirin 325 and recommended aspirin 81 and Plavix 75 for 3 weeks then Plavix alone.  Patient was previously on Zocor 20 mg and this was increased to 40 mg.  Recommended for patient to obtain a 30-day cardiac monitor as outpatient by Dr. Erlinda Hong is remote strokes have a potential for embolic pattern.  Discharged home with wife without need of therapies.  12/21/2017 visit: Since discharge, patient has been doing well and is accompanied at today's appointment with his wife.  He continues to take Plavix with mild bruising but no bleeding.  He continues to take Zocor without myalgias.  He did have a 30-day cardiac monitor ordered by his PCP which was placed on 12/14/2017.  Started on Lexapro by his PCP on 12/01/2017 for depression and anxiety.  He states after the first couple weeks after his stroke, he did not want to do anything or get off the couch or leave the house in fear of having  an additional stroke.  He feels that things have been already improving on the Lexapro when he is continuing to get back to his normal routines. His wife was questioning whether this dosage needs to be increased but it was informed that this medication could take up to 4-6 weeks to see full effect.  If he continues to have depression or anxiety after this time, advised him to call PCP for possible increase.  Today's blood pressure is mildly elevated at 153/84 but he does monitor this at home and typically runs 130s/80s.  Denies new or worsening stroke/TIA symptoms.   Update 05/25/2018: Patient is being seen today for scheduled follow-up visit and is accompanied by his wife.  He underwent a 30-day cardiac monitor which was negative for atrial fibrillation.  He states overall he has been doing well without residual deficits or recurring of symptoms.  Continues to take Plavix with mild bruising but no bleeding.  Continues to take Zocor without side effects myalgias.  Blood pressure today satisfactory 129/79.  Also states glucose levels have been stable.  He continues to exercise along with staying active.  He has since stopped Lexapro and denies continuing symptoms of depression.  Denies new or worsening stroke/TIA symptoms.  Update 08/23/2019: Mr. Lawhead is a 73 year old male who is being seen today for stroke follow-up. He has been doing well from a stroke standpoint without new  or reoccurring stroke/TIA symptoms.  He continues on Plavix and simvastatin for secondary stroke prevention without side effects.  Blood pressure today 158/88.  Glucose levels have been stable with improvement of A1c.  He continues to follow with PCP for HTN, HLD and DM management.  Recent lab work satisfactory.  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  No concerns at this time.     ROS:   14 system review of systems performed and negative  with exception of no concerns  PMH:  Past Medical History:  Diagnosis Date  . Diabetes mellitus without complication ([place])   . Environmental allergies    Hx: of  . GERD (gastroesophageal reflux disease)   . Hypertension   . Numbness and tingling    Hx; of  Right shoulder    PSH:  Past Surgical History:  Procedure Laterality Date  . ANTERIOR CERVICAL DECOMP/DISCECTOMY FUSION N/A 03/17/2013   Procedure: ANTERIOR CERVICAL DECOMPRESSION/DISCECTOMY FUSION 2 LEVEL C5-7;  Surgeon: [name], MD;  Location: [place];  Service: Orthopedics;  Laterality: N/A;  . COLONOSCOPY     Hx: of  . HERNIA REPAIR    . TONSILLECTOMY      Social History:  Social History   Socioeconomic History  . Marital status: Married    Spouse name: Not on file  . Number of children: Not on file  . Years of education: Not on file  . Highest education level: Not on file  Occupational History  . Not on file  Tobacco Use  . Smoking status: Former Smoker    Types: Cigars  . Smokeless tobacco: Never Used  Substance and Sexual Activity  . Alcohol use: No  . Drug use: No  . Sexual activity: Not on file  Other Topics Concern  . Not on file  Social History Narrative  . Not on file   Social Determinants of Health   Financial Resource Strain:   . Difficulty of Paying Living Expenses: Not on file  Food Insecurity:   . Worried About Charity fundraiser in the Last Year: Not on file  . Ran Out of Food in the Last Year: Not on file  Transportation Needs:   . Lack of Transportation (Medical): Not on file  . Lack of Transportation (Non-Medical): Not on file  Physical Activity:   . Days of Exercise per Week: Not on file  . Minutes of Exercise per Session: Not on file  Stress:   . Feeling of Stress : Not on file  Social Connections:   . Frequency of Communication with Friends and Family: Not on file  . Frequency of Social Gatherings with Friends and Family: Not on file  . Attends Religious Services: Not on  file  . Active Member of Clubs or Organizations: Not on file  . Attends Archivist Meetings: Not on file  . Marital Status: Not on file  Intimate Partner Violence:   . Fear of Current or Ex-Partner: Not on file  . Emotionally Abused: Not on file  . Physically Abused: Not on file  . Sexually Abused: Not on file    Family History:  Family History  Problem Relation Age of Onset  . Diabetes Brother     Medications:   Current Outpatient Medications on File Prior to Visit  Medication Sig Dispense Refill  . carvedilol (COREG) 12.5 MG tablet Take 12.5 mg by mouth 2 (two)  times daily with a meal.    . Cetirizine HCl (ZYRTEC PO) Take 1 tablet by mouth daily as needed (allergies).    . clopidogrel (PLAVIX) 75 MG tablet Take 1 tablet (75 mg total) by mouth daily. 30 tablet 0  . Cyanocobalamin (VITAMIN B-12 IJ) Inject 1 Applicatorful as directed every 30 (thirty) days.    Marland Kitchen glipiZIDE (GLUCOTROL XL) 10 MG 24 hr tablet Take 10 mg by mouth daily with breakfast.    . linagliptin (TRADJENTA) 5 MG TABS tablet Take 5 mg by mouth daily with breakfast.    . losartan (COZAAR) 50 MG tablet Take 50 mg by mouth daily with breakfast.    . metFORMIN (GLUCOPHAGE-XR) 500 MG 24 hr tablet Take 250 mg by mouth 2 (two) times daily with a meal.     . simvastatin (ZOCOR) 20 MG tablet Take 2 tablets (40 mg total) by mouth at bedtime. 30 tablet 0   No current facility-administered medications on file prior to visit.    Allergies:  No Known Allergies   Physical Exam  Vitals:   08/23/19 0836  BP: (!) 158/88  Pulse: 69  Temp: 97.8 F (36.6 C)  TempSrc: Oral  Weight: 209 lb 3.2 oz (94.9 kg)  Height: 5\' 11"  (1.803 m)   Body mass index is 29.18 kg/m. No exam data present  General: well developed, pleasant elderly Caucasian male, well nourished, seated, in no evident distress Head: head normocephalic and atraumatic.   Neck: supple with no carotid or supraclavicular bruits Cardiovascular: regular  rate and rhythm, no murmurs Musculoskeletal: no deformity Skin:  no rash/petichiae Vascular:  Normal pulses all extremities  Neurologic Exam Mental Status: Awake and fully alert. Oriented to place and time. Recent and remote memory intact. Attention span, concentration and fund of knowledge appropriate. Mood and affect appropriate.  Cranial Nerves: Pupils equal, briskly reactive to light. Extraocular movements full without nystagmus. Visual fields full to confrontation. Hearing intact. Facial sensation intact. Face, tongue, palate moves normally and symmetrically.  Motor: Normal bulk and tone. Normal strength in all tested extremity muscles. Sensory.: intact to touch , pinprick , position and vibratory sensation.  Coordination: Rapid alternating movements normal in all extremities. Finger-to-nose and heel-to-shin performed accurately bilaterally. Gait and Station: Arises from chair without difficulty. Stance is normal. Gait demonstrates normal stride length and balance . Able to heel, toe and tandem walk without difficulty.  Reflexes: 1+ and symmetric. Toes downgoing.     Diagnostic Data (Labs, Imaging, Testing)  CT head Study date: 11/18/2017 IMPRESSION: 1. No acute intracranial hemorrhage. 2. Mild age-related atrophy and chronic microvascular ischemic Changes.  MRI brain MRA head/neck Study date: 11/19/2017 IMPRESSION: 1. Hyperintense T2-weighted signal within the left insula and medial temporal lobe. While this may be a sequela of remote ischemia, there appears to be mild gyral edema rather than encephalomalacia. This pattern may be seen in multiple encephalitides, the most concerning of which is herpes encephalitis. If there is any clinical suspicion of herpes encephalitis, CSF sampling and empiric treatment should be considered. 2. Moderate narrowing of the left internal carotid artery skull base lacerum segment. Otherwise normal MRA of the head. 3. No hemodynamically  significant stenosis of the cervical carotid or vertebral arteries.  EEG Study date: 11/19/2017 Impression: This awake and drowsy EEG is abnormal due to diffuse slowing of the waking background.  2D echo Study date: 11/20/2017 Study Conclusions - Left ventricle: The cavity size was normal. There was mild   concentric hypertrophy. Systolic function was vigorous.  The   estimated ejection fraction was in the range of 65% to 70%. Wall   motion was normal; there were no regional wall motion   abnormalities. Doppler parameters are consistent with abnormal   left ventricular relaxation (grade 1 diastolic dysfunction).   There was no evidence of elevated ventricular filling pressure by   Doppler parameters. - Mitral valve: There was trivial regurgitation. - Right ventricle: The cavity size was normal. Wall thickness was   normal. Systolic function was normal. - Tricuspid valve: There was no significant regurgitation. - Pulmonary arteries: Systolic pressure could not be accurately   estimated. - Inferior vena cava: The vessel was normal in size. Impressions: - No prior study available for comparison.    ASSESSMENT: Virgil Brandenburg is a 73 y.o. year old male here with TIA on 11/18/2017. Vascular risk factors include DM, HLD and HTN.  Patient is being seen today for follow-up appointment and overall continues to do well from a stroke standpoint without new or reoccurring stroke/TIA symptoms   PLAN: -Continue clopidogrel 75 mg daily  and Zocor for secondary stroke prevention -F/u with PCP regarding your HLD, HTN and DM management -recent lab work obtained by PCP all satisfactory -continue to monitor BP at home -Continue to stay active and eat a healthy diet -Maintain strict control of hypertension with blood pressure goal below 130/90, diabetes with hemoglobin A1c goal below 6.5% and cholesterol with LDL cholesterol (bad cholesterol) goal below 70 mg/dL. I also advised the patient to eat a  healthy diet with plenty of whole grains, cereals, fruits and vegetables, exercise regularly and maintain ideal body weight.  Overall stable from stroke standpoint and advised to follow-up as needed but to call office with any questions or concerns regarding his TIA   Greater than 50% time during this 15 minute visit was spent on counseling and coordination of care about his prior TIA and importance of secondary stroke risk factor management including HLD, HTN and DM, and answered all questions to patient satisfaction  Frann Rider, [facility]  [facility] [address] [place], [place] [address]  Phone [phone] Fax [phone]

## 2021-08-08 DIAGNOSIS — U071 COVID-19: Secondary | ICD-10-CM

## 2021-08-08 HISTORY — DX: COVID-19: U07.1

## 2022-04-29 ENCOUNTER — Inpatient Hospital Stay (HOSPITAL_COMMUNITY)
Admission: EM | Admit: 2022-04-29 | Discharge: 2022-05-04 | DRG: 054 | Disposition: A | Discharge status: Home / Self Care | Payer: Medicare Other | Attending: Internal Medicine | Admitting: Internal Medicine

## 2022-04-29 ENCOUNTER — Inpatient Hospital Stay (HOSPITAL_COMMUNITY): Payer: Medicare Other

## 2022-04-29 ENCOUNTER — Encounter (HOSPITAL_COMMUNITY): Payer: Self-pay

## 2022-04-29 ENCOUNTER — Emergency Department (HOSPITAL_COMMUNITY): Payer: Medicare Other

## 2022-04-29 ENCOUNTER — Other Ambulatory Visit: Payer: Self-pay

## 2022-04-29 DIAGNOSIS — T380X5A Adverse effect of glucocorticoids and synthetic analogues, initial encounter: Secondary | ICD-10-CM | POA: Diagnosis present

## 2022-04-29 DIAGNOSIS — Z87891 Personal history of nicotine dependence: Secondary | ICD-10-CM | POA: Diagnosis not present

## 2022-04-29 DIAGNOSIS — G936 Cerebral edema: Secondary | ICD-10-CM | POA: Diagnosis present

## 2022-04-29 DIAGNOSIS — Z79899 Other long term (current) drug therapy: Secondary | ICD-10-CM | POA: Diagnosis not present

## 2022-04-29 DIAGNOSIS — Z981 Arthrodesis status: Secondary | ICD-10-CM | POA: Diagnosis not present

## 2022-04-29 DIAGNOSIS — E876 Hypokalemia: Secondary | ICD-10-CM | POA: Diagnosis present

## 2022-04-29 DIAGNOSIS — Z8249 Family history of ischemic heart disease and other diseases of the circulatory system: Secondary | ICD-10-CM | POA: Diagnosis not present

## 2022-04-29 DIAGNOSIS — G9349 Other encephalopathy: Secondary | ICD-10-CM | POA: Diagnosis present

## 2022-04-29 DIAGNOSIS — R4182 Altered mental status, unspecified: Secondary | ICD-10-CM

## 2022-04-29 DIAGNOSIS — N179 Acute kidney failure, unspecified: Secondary | ICD-10-CM | POA: Diagnosis present

## 2022-04-29 DIAGNOSIS — N1831 Chronic kidney disease, stage 3a: Secondary | ICD-10-CM | POA: Diagnosis present

## 2022-04-29 DIAGNOSIS — R569 Unspecified convulsions: Secondary | ICD-10-CM | POA: Diagnosis not present

## 2022-04-29 DIAGNOSIS — Z833 Family history of diabetes mellitus: Secondary | ICD-10-CM

## 2022-04-29 DIAGNOSIS — K219 Gastro-esophageal reflux disease without esophagitis: Secondary | ICD-10-CM | POA: Diagnosis present

## 2022-04-29 DIAGNOSIS — Z7984 Long term (current) use of oral hypoglycemic drugs: Secondary | ICD-10-CM | POA: Diagnosis not present

## 2022-04-29 DIAGNOSIS — Z7902 Long term (current) use of antithrombotics/antiplatelets: Secondary | ICD-10-CM

## 2022-04-29 DIAGNOSIS — G9340 Encephalopathy, unspecified: Secondary | ICD-10-CM | POA: Diagnosis present

## 2022-04-29 DIAGNOSIS — Y92239 Unspecified place in hospital as the place of occurrence of the external cause: Secondary | ICD-10-CM | POA: Diagnosis present

## 2022-04-29 DIAGNOSIS — Z794 Long term (current) use of insulin: Secondary | ICD-10-CM | POA: Diagnosis not present

## 2022-04-29 DIAGNOSIS — C719 Malignant neoplasm of brain, unspecified: Secondary | ICD-10-CM | POA: Diagnosis present

## 2022-04-29 DIAGNOSIS — G9389 Other specified disorders of brain: Secondary | ICD-10-CM | POA: Diagnosis not present

## 2022-04-29 DIAGNOSIS — I129 Hypertensive chronic kidney disease with stage 1 through stage 4 chronic kidney disease, or unspecified chronic kidney disease: Secondary | ICD-10-CM | POA: Diagnosis present

## 2022-04-29 DIAGNOSIS — Z20822 Contact with and (suspected) exposure to covid-19: Secondary | ICD-10-CM | POA: Diagnosis present

## 2022-04-29 DIAGNOSIS — E1122 Type 2 diabetes mellitus with diabetic chronic kidney disease: Secondary | ICD-10-CM

## 2022-04-29 DIAGNOSIS — E878 Other disorders of electrolyte and fluid balance, not elsewhere classified: Secondary | ICD-10-CM | POA: Diagnosis not present

## 2022-04-29 DIAGNOSIS — I161 Hypertensive emergency: Secondary | ICD-10-CM | POA: Diagnosis present

## 2022-04-29 DIAGNOSIS — R001 Bradycardia, unspecified: Secondary | ICD-10-CM | POA: Diagnosis present

## 2022-04-29 DIAGNOSIS — E1165 Type 2 diabetes mellitus with hyperglycemia: Secondary | ICD-10-CM | POA: Diagnosis present

## 2022-04-29 DIAGNOSIS — I10 Essential (primary) hypertension: Secondary | ICD-10-CM | POA: Diagnosis present

## 2022-04-29 DIAGNOSIS — Z8673 Personal history of transient ischemic attack (TIA), and cerebral infarction without residual deficits: Secondary | ICD-10-CM

## 2022-04-29 DIAGNOSIS — G40909 Epilepsy, unspecified, not intractable, without status epilepticus: Secondary | ICD-10-CM | POA: Diagnosis present

## 2022-04-29 DIAGNOSIS — N1832 Chronic kidney disease, stage 3b: Secondary | ICD-10-CM | POA: Diagnosis present

## 2022-04-29 LAB — URINALYSIS, ROUTINE W REFLEX MICROSCOPIC
Bacteria, UA: NONE SEEN
Bilirubin Urine: NEGATIVE
Glucose, UA: >=500 mg/dL — AB
Hgb urine dipstick: NEGATIVE
Ketones, ur: 5 mg/dL — AB
Leukocytes,Ua: NEGATIVE
Nitrite: NEGATIVE
Protein, ur: 100 mg/dL — AB
Specific Gravity, Urine: 1.02 (ref 1.005–1.030)
pH: 5 (ref 5.0–8.0)

## 2022-04-29 LAB — I-STAT CHEM 8, ED
BUN: 17 mg/dL (ref 8–23)
Calcium, Ion: 1.1 mmol/L — ABNORMAL LOW (ref 1.15–1.40)
Chloride: 99 mmol/L (ref 98–111)
Creatinine, Ser: 1 mg/dL (ref 0.61–1.24)
Glucose, Bld: 216 mg/dL — ABNORMAL HIGH (ref 70–99)
HCT: 48 % (ref 39.0–52.0)
Hemoglobin: 16.3 g/dL (ref 13.0–17.0)
Potassium: 3.5 mmol/L (ref 3.5–5.1)
Sodium: 136 mmol/L (ref 135–145)
TCO2: 26 mmol/L (ref 22–32)

## 2022-04-29 LAB — DIFFERENTIAL
Abs Immature Granulocytes: 0.04 10*3/uL (ref 0.00–0.07)
Basophils Absolute: 0 10*3/uL (ref 0.0–0.1)
Basophils Relative: 0 %
Eosinophils Absolute: 0.1 10*3/uL (ref 0.0–0.5)
Eosinophils Relative: 1 %
Immature Granulocytes: 0 %
Lymphocytes Relative: 17 %
Lymphs Abs: 1.7 10*3/uL (ref 0.7–4.0)
Monocytes Absolute: 0.5 10*3/uL (ref 0.1–1.0)
Monocytes Relative: 5 %
Neutro Abs: 7.8 10*3/uL — ABNORMAL HIGH (ref 1.7–7.7)
Neutrophils Relative %: 77 %

## 2022-04-29 LAB — COMPREHENSIVE METABOLIC PANEL
ALT: 17 U/L (ref 0–44)
AST: 15 U/L (ref 15–41)
Albumin: 4.1 g/dL (ref 3.5–5.0)
Alkaline Phosphatase: 81 U/L (ref 38–126)
Anion gap: 9 (ref 5–15)
BUN: 16 mg/dL (ref 8–23)
CO2: 27 mmol/L (ref 22–32)
Calcium: 9.3 mg/dL (ref 8.9–10.3)
Chloride: 100 mmol/L (ref 98–111)
Creatinine, Ser: 1.16 mg/dL (ref 0.61–1.24)
GFR, Estimated: >60 mL/min (ref >60)
Glucose, Bld: 222 mg/dL — ABNORMAL HIGH (ref 70–99)
Potassium: 3.5 mmol/L (ref 3.5–5.1)
Sodium: 136 mmol/L (ref 135–145)
Total Bilirubin: 1.1 mg/dL (ref 0.3–1.2)
Total Protein: 7.3 g/dL (ref 6.5–8.1)

## 2022-04-29 LAB — RESP PANEL BY RT-PCR (FLU A&B, COVID) ARPGX2
Influenza A by PCR: NEGATIVE
Influenza B by PCR: NEGATIVE
SARS Coronavirus 2 by RT PCR: NEGATIVE

## 2022-04-29 LAB — HEMOGLOBIN A1C
Hgb A1c MFr Bld: 7.6 % — ABNORMAL HIGH (ref 4.8–5.6)
Mean Plasma Glucose: 171.42 mg/dL

## 2022-04-29 LAB — CBC
HCT: 44.6 % (ref 39.0–52.0)
Hemoglobin: 15.6 g/dL (ref 13.0–17.0)
MCH: 31.5 pg (ref 26.0–34.0)
MCHC: 35 g/dL (ref 30.0–36.0)
MCV: 89.9 fL (ref 80.0–100.0)
Platelets: 191 10*3/uL (ref 150–400)
RBC: 4.96 MIL/uL (ref 4.22–5.81)
RDW: 12.2 % (ref 11.5–15.5)
WBC: 10 10*3/uL (ref 4.0–10.5)
nRBC: 0 % (ref 0.0–0.2)

## 2022-04-29 LAB — TROPONIN I (HIGH SENSITIVITY)
Troponin I (High Sensitivity): 9 ng/L (ref <18)
Troponin I (High Sensitivity): 9 ng/L (ref <18)

## 2022-04-29 LAB — ETHANOL: Alcohol, Ethyl (B): <10 mg/dL (ref <10)

## 2022-04-29 LAB — RAPID URINE DRUG SCREEN, HOSP PERFORMED
Amphetamines: NOT DETECTED
Barbiturates: NOT DETECTED
Benzodiazepines: NOT DETECTED
Cocaine: NOT DETECTED
Opiates: NOT DETECTED
Tetrahydrocannabinol: NOT DETECTED

## 2022-04-29 LAB — CBG MONITORING, ED: Glucose-Capillary: 208 mg/dL — ABNORMAL HIGH (ref 70–99)

## 2022-04-29 LAB — HIV ANTIBODY (ROUTINE TESTING W REFLEX): HIV Screen 4th Generation wRfx: NONREACTIVE

## 2022-04-29 LAB — PROTIME-INR
INR: 1 (ref 0.8–1.2)
Prothrombin Time: 13.5 seconds (ref 11.4–15.2)

## 2022-04-29 LAB — APTT: aPTT: 27 seconds (ref 24–36)

## 2022-04-29 MED ORDER — SIMVASTATIN 20 MG PO TABS
20.0000 mg | ORAL_TABLET | Freq: Every day | ORAL | Status: DC
  Administered 2022-04-29 – 2022-05-03 (×5): 20 mg via ORAL
  Filled 2022-04-29 (×5): qty 1

## 2022-04-29 MED ORDER — SODIUM CHLORIDE 0.9 % IV SOLN
2.0000 g | Freq: Once | INTRAVENOUS | Status: AC
  Administered 2022-04-29: 2 g via INTRAVENOUS
  Filled 2022-04-29: qty 20

## 2022-04-29 MED ORDER — GADOBUTROL 1 MMOL/ML IV SOLN
9.0000 mL | Freq: Once | INTRAVENOUS | Status: AC | PRN
  Administered 2022-04-29: 9 mL via INTRAVENOUS

## 2022-04-29 MED ORDER — NICARDIPINE HCL IN NACL 20-0.86 MG/200ML-% IV SOLN
3.0000 mg/h | INTRAVENOUS | Status: DC
  Administered 2022-04-29: 5 mg/h via INTRAVENOUS
  Filled 2022-04-29: qty 200

## 2022-04-29 MED ORDER — SODIUM CHLORIDE 0.9 % IV SOLN
2.0000 g | Freq: Once | INTRAVENOUS | Status: AC
  Administered 2022-04-29: 2 g via INTRAVENOUS
  Filled 2022-04-29: qty 2000

## 2022-04-29 MED ORDER — VANCOMYCIN HCL 1250 MG/250ML IV SOLN
1250.0000 mg | Freq: Two times a day (BID) | INTRAVENOUS | Status: DC
  Filled 2022-04-29: qty 250

## 2022-04-29 MED ORDER — LOSARTAN POTASSIUM 50 MG PO TABS
100.0000 mg | ORAL_TABLET | Freq: Every day | ORAL | Status: DC
  Administered 2022-04-29 – 2022-05-04 (×6): 100 mg via ORAL
  Filled 2022-04-29 (×6): qty 2

## 2022-04-29 MED ORDER — HYDRALAZINE HCL 20 MG/ML IJ SOLN
10.0000 mg | Freq: Once | INTRAMUSCULAR | Status: AC
  Administered 2022-04-29: 10 mg via INTRAVENOUS
  Filled 2022-04-29: qty 1

## 2022-04-29 MED ORDER — PANTOPRAZOLE SODIUM 40 MG PO TBEC: 40.0000 mg | DELAYED_RELEASE_TABLET | Freq: Every day | ORAL | Status: DC | PRN

## 2022-04-29 MED ORDER — VANCOMYCIN HCL 2000 MG/400ML IV SOLN
2000.0000 mg | Freq: Once | INTRAVENOUS | Status: DC
  Administered 2022-04-29: 2000 mg via INTRAVENOUS
  Filled 2022-04-29: qty 400

## 2022-04-29 MED ORDER — LORAZEPAM 2 MG/ML IJ SOLN
0.5000 mg | Freq: Once | INTRAMUSCULAR | Status: AC | PRN
  Administered 2022-04-29: 0.5 mg via INTRAVENOUS
  Filled 2022-04-29: qty 1

## 2022-04-29 MED ORDER — SODIUM CHLORIDE 0.9 % IV SOLN
2.0000 g | INTRAVENOUS | Status: DC
  Filled 2022-04-29 (×4): qty 2000

## 2022-04-29 MED ORDER — ESCITALOPRAM OXALATE 10 MG PO TABS
10.0000 mg | ORAL_TABLET | Freq: Every day | ORAL | Status: DC
  Administered 2022-04-29 – 2022-05-04 (×6): 10 mg via ORAL
  Filled 2022-04-29 (×6): qty 1

## 2022-04-29 MED ORDER — CARVEDILOL 12.5 MG PO TABS
12.5000 mg | ORAL_TABLET | Freq: Two times a day (BID) | ORAL | Status: DC
  Administered 2022-04-30 – 2022-05-03 (×7): 12.5 mg via ORAL
  Filled 2022-04-29 (×7): qty 1

## 2022-04-29 MED ORDER — LEVETIRACETAM 500 MG PO TABS
500.0000 mg | ORAL_TABLET | Freq: Two times a day (BID) | ORAL | Status: DC
  Administered 2022-04-29 – 2022-05-04 (×10): 500 mg via ORAL
  Filled 2022-04-29 (×10): qty 1

## 2022-04-29 MED ORDER — DEXTROSE 5 % IV SOLN
900.0000 mg | Freq: Three times a day (TID) | INTRAVENOUS | Status: DC
  Administered 2022-04-29: 900 mg via INTRAVENOUS
  Filled 2022-04-29 (×3): qty 18

## 2022-04-29 MED ORDER — SODIUM CHLORIDE 0.9 % IV SOLN: 2.0000 g | INTRAVENOUS | Status: DC

## 2022-04-29 MED ORDER — LACTATED RINGERS IV SOLN: INTRAVENOUS | Status: DC

## 2022-04-29 MED ORDER — INSULIN ASPART 100 UNIT/ML IJ SOLN
0.0000 [IU] | Freq: Three times a day (TID) | INTRAMUSCULAR | Status: DC
  Administered 2022-04-30: 3 [IU] via SUBCUTANEOUS
  Administered 2022-04-30: 2 [IU] via SUBCUTANEOUS
  Administered 2022-04-30: 3 [IU] via SUBCUTANEOUS

## 2022-04-29 MED ORDER — SODIUM CHLORIDE 0.9 % IV SOLN: Freq: Once | INTRAVENOUS | Status: AC

## 2022-04-29 MED ORDER — SODIUM CHLORIDE 0.9 % IV SOLN: 2.0000 g | Freq: Two times a day (BID) | INTRAVENOUS | Status: DC

## 2022-04-29 NOTE — ED Triage Notes (Addendum)
Pt Jorge [name] from [employer] stated he stared off into space and was confused by time EMS arrived Pt was alert and oriented x4.  Per EMS stroke screen was negative but pt was hypertensive.

## 2022-04-29 NOTE — ED Provider Notes (Signed)
[facility] EMERGENCY DEPARTMENT Provider Note   CSN: [id number] Arrival date & time: 04/29/22  1138     History  Chief Complaint  Patient presents with   Hypertension   Loss of Consciousness    Jorge [name] is a 76 y.o. male.  Patient presents to the hospital complaining of possible loss of consciousness, hypertension, and confusion which began at approximately noon today.  Patient had a doctor's appointment this morning.  After the appointment they went to a restaurant for lunch.  During the lunch the patient began to stare blankly and was unable to speak.  EMS transported him to the emergency department where he had returned to baseline.  While in the emergency department he began to have repeat episodes where he would become increasingly confused and then more coherent.  He had no physical deficits upon arrival.  No slurred speech.  No facial droop.  The patient had no complaints of his own.  He denied headaches.  Denied any recent illnesses.  Family confirmed that over the past few months the patient has had increasing incidence of similar nature.  Over the past week these have become much more frequent.  They state that the last time they felt he was truly normal for a significant period of time was before the weekend.  Of note the patient was significantly hypertensive upon arrival. Patient with past medical history significant for hypertension, GERD, DM without complication, history of numbness and tingling, CKD stage III, history of TIA  HPI     Home Medications Prior to Admission medications   Medication Sig Start Date End Date Taking? Authorizing Provider  busPIRone (BUSPAR) 5 MG tablet Take 5 mg by mouth 3 (three) times daily as needed (anxiety). 03/04/22  Yes [provider]  carvedilol (COREG) 12.5 MG tablet Take 12.5 mg by mouth 2 (two) times daily with a meal.   Yes [provider]  Cetirizine HCl (ZYRTEC PO) Take 10 mg by mouth daily as  needed (allergies).   Yes [provider]  clopidogrel (PLAVIX) 75 MG tablet Take 1 tablet (75 mg total) by mouth daily. 11/20/17  Yes Rosita Fire, MD  diphenhydramine-acetaminophen (TYLENOL PM) 25-500 MG TABS tablet Take 1 tablet by mouth at bedtime as needed (sleep).   Yes [provider]  escitalopram (LEXAPRO) 10 MG tablet Take 10 mg by mouth daily. 03/04/22  Yes [provider]  glipiZIDE (GLUCOTROL XL) 10 MG 24 hr tablet Take 10 mg by mouth daily with breakfast.   Yes [provider]  losartan (COZAAR) 100 MG tablet Take 100 mg by mouth daily. 04/17/22  Yes [provider]  metFORMIN (GLUCOPHAGE-XR) 500 MG 24 hr tablet Take 250 mg by mouth 2 (two) times daily with a meal.    Yes [provider]  naproxen sodium (ALEVE) 220 MG tablet Take 220 mg by mouth daily as needed (pain).   Yes [provider]  pantoprazole (PROTONIX) 40 MG tablet Take 40 mg by mouth daily as needed (acid reflux). 12/13/21  Yes [provider]  sildenafil (VIAGRA) 100 MG tablet Take 100 mg by mouth daily as needed for erectile dysfunction. 02/26/22  Yes [provider]  simvastatin (ZOCOR) 20 MG tablet Take 2 tablets (40 mg total) by mouth at bedtime. Patient taking differently: Take 20 mg by mouth at bedtime. 11/20/17  Yes Rosita Fire, MD      Allergies    Patient has no known allergies.  Review of Systems   Review of Systems  Constitutional:  Negative for fever.  Respiratory:  Negative for shortness of breath.   Cardiovascular:  Negative for chest pain.  Gastrointestinal:  Negative for abdominal pain, nausea and vomiting.  Genitourinary:  Negative for dysuria.  Neurological:  Negative for headaches.  Psychiatric/Behavioral:  Positive for confusion.     Physical Exam Updated Vital Signs BP (!) 187/90   Pulse 82   Temp 97.6 F (36.4 C) (Oral)   Resp 17   Ht '5\' 11"'$  (1.803 m)   Wt 90.7 kg   SpO2 100%   BMI  27.89 kg/m  Physical Exam Vitals and nursing note reviewed.  Constitutional:      General: He is not in acute distress. HENT:     Head: Normocephalic.     Mouth/Throat:     Mouth: Mucous membranes are moist.  Eyes:     Conjunctiva/sclera: Conjunctivae normal.  Cardiovascular:     Rate and Rhythm: Normal rate and regular rhythm.     Pulses: Normal pulses.     Heart sounds: Normal heart sounds.  Pulmonary:     Effort: Pulmonary effort is normal.     Breath sounds: Normal breath sounds.  Abdominal:     Palpations: Abdomen is soft.     Tenderness: There is no abdominal tenderness.  Musculoskeletal:        General: Normal range of motion.     Cervical back: Normal range of motion and neck supple.  Skin:    General: Skin is warm and dry.  Neurological:     Mental Status: He is alert.     Comments: Cranial nerve II through VII, XI, XII intact Patient intermittently confused     ED Results / Procedures / Treatments   Labs (all labs ordered are listed, but only abnormal results are displayed) Labs Reviewed  DIFFERENTIAL - Abnormal; Notable for the following components:      Result Value   Neutro Abs 7.8 (*)    All other components within normal limits  COMPREHENSIVE METABOLIC PANEL - Abnormal; Notable for the following components:   Glucose, Bld 222 (*)    All other components within normal limits  URINALYSIS, ROUTINE W REFLEX MICROSCOPIC - Abnormal; Notable for the following components:   Glucose, UA >=500 (*)    Ketones, ur 5 (*)    Protein, ur 100 (*)    All other components within normal limits  I-STAT CHEM 8, ED - Abnormal; Notable for the following components:   Glucose, Bld 216 (*)    Calcium, Ion 1.10 (*)    All other components within normal limits  RESP PANEL BY RT-PCR (FLU A&B, COVID) ARPGX2  CULTURE, BLOOD (ROUTINE X 2)  CULTURE, BLOOD (ROUTINE X 2)  ETHANOL  PROTIME-INR  APTT  CBC  RAPID URINE DRUG SCREEN, HOSP PERFORMED  TROPONIN I (HIGH SENSITIVITY)   TROPONIN I (HIGH SENSITIVITY)    EKG None  Radiology CT HEAD WO CONTRAST  Result Date: 04/29/2022 CLINICAL DATA:  Acute neuro deficit. Aphasia. Severe hypertension today. EXAM: CT HEAD WITHOUT CONTRAST TECHNIQUE: Contiguous axial images were obtained from the base of the skull through the vertex without intravenous contrast. RADIATION DOSE REDUCTION: This exam was performed according to the departmental dose-optimization program which includes automated exposure control, adjustment of the mA and/or kV according to patient size and/or use of iterative reconstruction technique. COMPARISON:  CT head 11/24/2017.  MRI head 11/19/2017 FINDINGS: Brain: Extensive low-density edema is present bilaterally,  left greater than right. This is centered in the left insula but extends into the left basal ganglia and left frontal and temporal lobe. This shows significant progression compared with 2019 MRI. There is also edema in the deep white matter tracks on the right extending into the right insula. Ventricle size normal. Mild flattening of the left lateral ventricle due to edema. No acute hemorrhage. Vascular: Middle cerebral arteries shows relative hyperdensity diffusely and bilaterally. This is likely due to surrounding brain edema. Skull: Negative Sinuses/Orbits: Complete opacification right maxillary sinus. Mucosal edema right frontal and ethmoid sinus. Bilateral cataract extraction Other: None IMPRESSION: Extensive low-density edema centered in the insula bilaterally, left greater than right. This was present on MRI in 2019 but shows significant progression. There is local mass-effect flattening left lateral ventricle. No acute hemorrhage. There is relative hyperdensity of the MCA bilaterally which is likely accentuated weighted due to surrounding edema in the brain rather than arterial thrombosis. These nonspecific features could be seen with encephalitis. Consider herpes. Tumor is a consideration however the  edema is not particularly masslike and was present 4 years ago although with significant progression. Also consider PRES given the history of severe hypertension today These results were called by telephone at the time of interpretation on 04/29/2022 at 2:28 pm to provider Nechama Guard, who verbally acknowledged these results. Electronically Signed   By: Franchot Gallo M.D.   On: 04/29/2022 14:30    Procedures .Critical Care  Performed by: Dorothyann Peng, PA-C Authorized by: Dorothyann Peng, PA-C   Critical care provider statement:    Critical care time (minutes):  30   Critical care time was exclusive of:  Separately billable procedures and treating other patients   Critical care was necessary to treat or prevent imminent or life-threatening deterioration of the following conditions:  CNS failure or compromise   Critical care was time spent personally by me on the following activities:  Development of treatment plan with patient or surrogate, discussions with consultants, evaluation of patient's response to treatment, examination of patient, ordering and review of laboratory studies, ordering and review of radiographic studies, ordering and performing treatments and interventions, pulse oximetry, re-evaluation of patient's condition and review of old charts   Care discussed with: admitting provider       Medications Ordered in ED Medications  cefTRIAXone (ROCEPHIN) 2 g in sodium chloride 0.9 % 100 mL IVPB (has no administration in time range)  LORazepam (ATIVAN) injection 0.5 mg (has no administration in time range)  acyclovir (ZOVIRAX) 900 mg in dextrose 5 % 250 mL IVPB (has no administration in time range)  lactated ringers infusion (has no administration in time range)  vancomycin (VANCOREADY) IVPB 2000 mg/400 mL (has no administration in time range)    Followed by  vancomycin (VANCOREADY) IVPB 1250 mg/250 mL (has no administration in time range)  hydrALAZINE (APRESOLINE) injection 10 mg  (10 mg Intravenous Given 04/29/22 1335)  ampicillin (OMNIPEN) 2 g in sodium chloride 0.9 % 100 mL IVPB (2 g Intravenous New Bag/Given 04/29/22 1537)  0.9 %  sodium chloride infusion ( Intravenous New Bag/Given 04/29/22 1502)  hydrALAZINE (APRESOLINE) injection 10 mg (10 mg Intravenous Given 04/29/22 1455)    ED Course/ Medical Decision Making/ A&P Clinical Course as of 04/29/22 1601  Tue Apr 29, 2022  1420 I discussed with radiologist on [facility] regarding CT imaging.  He says abnormal cerebral edema suggestive of possible HSV encephalitis or possible press.  With his severe hypertension favor more likely press.  Normal white blood cell count and no history of HSV 1 or 2 think HSV encephalitis less likely. We will plan for likely encephalitis coverage, LP if able to obtain an MRI with and without contrast and neurology consult. [VB]  9798 Patient seen and evaluated by Dr Malen Gauze with matting and redness only nausea during LP at this time due to Plavix use and will get LP inpatient. MRI pending, signed out to incoming team.  [VB]    Clinical Course User Index [VB] Elgie Congo, MD                           Medical Decision Making Amount and/or Complexity of Data Reviewed Labs: ordered. Radiology: ordered.  Risk Prescription drug management.   This patient presents to the ED for concern of altered mental status, this involves an extensive number of treatment options, and is a complaint that carries with it a high risk of complications and morbidity.  The differential diagnosis includes CVA, TIA, other intracranial abnormality, meningitis, metabolic encephalopathy, others   Co morbidities that complicate the patient evaluation  History of TIA, hypertension   Additional history obtained:  Additional history obtained from family at bedside, EMS External records from outside source obtained and reviewed including records of office visits for hypertension and hyperlipidemia including  appointment this morning   Lab Tests:  I Ordered, and personally interpreted labs.  The pertinent results include: Urinalysis with greater than 500 glucose, nothing detected on urine rapid drug screen, initial troponin of 9, glucose 222 on CMP, grossly normal CBC, negative respiratory panel   Imaging Studies ordered:  I ordered imaging studies including CT head without contrast and MR brain with without contrast I independently visualized and interpreted imaging which showed  Extensive low-density edema centered in the insula bilaterally, left  greater than right. This was present on MRI in 2019 but shows  significant progression. There is local mass-effect flattening left  lateral ventricle. No acute hemorrhage.    There is relative hyperdensity of the MCA bilaterally which is  likely accentuated weighted due to surrounding edema in the brain  rather than arterial thrombosis.    These nonspecific features could be seen with encephalitis. Consider  herpes. Tumor is a consideration however the edema is not  particularly masslike and was present 4 years ago although with  significant progression.    Also consider PRES given the history of severe hypertension today   I agree with the radiologist interpretation MRI pending at this time  Cardiac Monitoring: / EKG:  The patient was maintained on a cardiac monitor.  I personally viewed and interpreted the cardiac monitored which showed an underlying rhythm of: Sinus rhythm   Consultations Obtained:  I requested consultation with the neurologist, Dr. Rory Percy,  and discussed lab and imaging findings as well as pertinent plan - they recommend: MRI brain with without contrast, coverage for possible meningitis, and lumbar puncture.  Upon further discussion lumbar puncture postponed due to Plavix   Problem List / ED Course / Critical interventions / Medication management   I ordered medication including hydralazine for hypertension,  ampicillin, vancomycin, Rocephin, acyclovir for meningitis coverage, Ativan for anxiety  Reevaluation of the patient after these medicines showed that the patient improved I have reviewed the patients home medicines and have made adjustments as needed   Test / Admission - Considered:  The patient will need admission for further work-up of his altered mental status.  Unclear  at this time as to the etiology but concerning findings on CT scan for possible PRES. MRI has been ordered and lumbar puncture postponed until after admission.  Plan for admission to medicine service with neurology to consult. Patient care being transferred to Suella Broad, PA-C at shift handoff with plans for medicine admission        Final Clinical Impression(s) / ED Diagnoses Final diagnoses:  Hypertension, unspecified type  Altered mental status, unspecified altered mental status type    Rx / DC Orders ED Discharge Orders     None         Ronny Bacon 04/29/22 1602    Elgie Congo, MD 04/29/22 [phone]

## 2022-04-29 NOTE — Plan of Care (Addendum)
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    Plan: - Recommend reaching out to neurosurgery in the morning for at least curbside recommendations, given likely time course of this process and the fact that he has been on Plavix, suspect outpatient follow-up will be appropriate but again I defer to their expertise - Given mild mass effect with minimal edema and patient's mentation stable, I do not think he urgently needs steroids but defer to neurosurgery - If confused still or not at baseline give loading dose 2 g IV Keppra, otherwise just proceed with maintenance Keppra dose - Maintenance Keppra 500 mg BID (oral total IV dose conversion is one to one, oral is okay if he is tolerating this). Adjust as needed for renal function:  Estimated Creatinine Clearance: 72.4 mL/min (by C-G formula based on SCr of 1 mg/dL).   CrCl 80 to 130 mL/minute/1.73 m2: 500 mg to 1.5 g every 12 hours.  CrCl 50 to <80 mL/minute/1.73 m2: 500 mg to 1 g every 12 hours.  CrCl 30 to <50 mL/minute/1.73 m2: 250 to 750 mg every 12 hours.  CrCl 15 to <30 mL/minute/1.73 m2: 250 to 500 mg every 12 hours.  CrCl <15 mL/minute/1.73 m2: 250 to 500 mg every 24 hours (expert opinion) - Okay to discontinue antimicrobial agents at this time based on imaging, lack of fever and leukocytosis - Neurology will follow-up EEG but otherwise will be available on an as-needed basis going forward, please do not hesitate to reach out if any questions or concerns arise  Lesleigh Noe MD-PhD [facility] [phone]  Available 7 PM to 7 AM, outside of these hours please call Neurologist on call as listed on Amion.   Billing by Dr. Rory Percy earlier today, no charge plan of care note

## 2022-04-29 NOTE — ED Provider Notes (Signed)
76 yo male with altered mental status. Discussed with neuro who is concerned about mass vs encephalitis (getting abx and antiviral IV). Pending MRI without contrast, admit to medicine with neuro consult. Seen by neurohospitalist, Dr. Rory Percy- on Plavix- HOLD for possible LP later in the week. Continue abx.antivirals. BP goal 149 systolic.   BP elevated, given hydralazine ('20mg'$ ) BP improved, currently 186/90.  Physical Exam  BP 135/79   Pulse 100   Temp (!) 97.4 F (36.3 C) (Oral)   Resp 20   Ht '5\' 11"'$  (1.803 m)   Wt 90.7 kg   SpO2 98%   BMI 27.89 kg/m   Physical Exam  Procedures  Procedures  ED Course / MDM   Clinical Course as of 04/29/22 1832  Tue Apr 29, 2022  1420 I discussed with radiologist on [facility] regarding CT imaging.  He says abnormal cerebral edema suggestive of possible HSV encephalitis or possible press.  With his severe hypertension favor more likely press.  Normal white blood cell count and no history of HSV 1 or 2 think HSV encephalitis less likely. We will plan for likely encephalitis coverage, LP if able to obtain an MRI with and without contrast and neurology consult. [VB]  7026 Patient seen and evaluated by Dr Malen Gauze suspects s/s of mass. He is recommending no LP at this time due to Plavix use and will get LP inpatient. MRI pending, signed out to incoming team and hospitalist admit. [VB]    Clinical Course User Index [VB] Elgie Congo, MD   Medical Decision Making Amount and/or Complexity of Data Reviewed Labs: ordered. Radiology: ordered.  Risk Prescription drug management. Decision regarding hospitalization.   Case discussed with teaching service who will consult for admission.       Tacy Learn, PA-C 37/85/88 [id number]    Lianne Cure, DO 74/12/87 [id number]

## 2022-04-29 NOTE — ED Notes (Signed)
Patient transported to MRI 

## 2022-04-29 NOTE — ED Notes (Signed)
Family notified staff about change in pt mental status. Pt appears to be confused, alert to self only; pt pulling on wires trying to remove them and not able to answer questions appropriately. Provider Nechama Guard, MD notified and at the bedside. Transport here to take pt to CT.

## 2022-04-29 NOTE — ED Notes (Signed)
Neurology at bedside.

## 2022-04-29 NOTE — H&P (Cosign Needed)
Date: 04/29/2022               Patient Name:  Giovonnie Trettel MRN: [id number]  DOB: 15-Jan-1946 Age / Sex: 76 y.o., male   PCP: Gweneth Fritter, [place]         Medical Service: Internal Medicine Teaching Service         Attending Physician: Dr. Lucious Groves, DO    First Contact: Angelique Blonder, DO  Pager: [phone]    Second Contact: Sanjuana Letters, DO  Pager: Maryjean Morn [phone]         After Hours (After 5p/  First Contact Pager: [phone]  weekends / holidays): Second Contact Pager: [phone]   SUBJECTIVE   Chief Complaint: altered mental status  History of Present Illness:  Mr. Ardith Lewman is a 76 year old male with PMH of TIA in 2019, HTN, T2DM, CKD 3 that presents to the ED for confusion and weakness. This morning he went to his primary care then to breakfast when his wife noticed he would stare off in the distance. He does not recall these episodes and per family he would return to normal after. He reports feeling more fatigued today but no other concerns. Denies headache, nausea, vomiting, dizziness, lightheadedness.   His wife, son and daughter-in-law were present at bedside and provided most of the information. Noticed these staring episodes for past couple months that became more frequent the past week. They believe the episodes are similar to the ones in 2019 when he had the TIA. Wife does report urinary incontinence for past 2 weeks. He is unable to make it to the toilet in time. Denies dysuria.   In 2019, he presented with slurred speech, right side facial droop and weakness. Worked up for CVA. MRI head showed hyperintensity at left insula and medial temporal lobe, concern for remote ischemia vs encephalitis. EEG did not show seizure activity at that time.   ED Course: Patient was in hypertensive emergency, BP 220s/120s. Nicardipine drip started in ED. UA showed proteinuria, ketone, glucose. He was started on IV antibiotics and antiviral. CT head showed extensive edema in insula  bilaterally, worsening from 2019 MRI.   Meds:  Current Meds  Medication Sig   busPIRone (BUSPAR) 5 MG tablet Take 5 mg by mouth 3 (three) times daily as needed (anxiety).   carvedilol (COREG) 12.5 MG tablet Take 12.5 mg by mouth 2 (two) times daily with a meal.   Cetirizine HCl (ZYRTEC PO) Take 10 mg by mouth daily as needed (allergies).   clopidogrel (PLAVIX) 75 MG tablet Take 1 tablet (75 mg total) by mouth daily.   diphenhydramine-acetaminophen (TYLENOL PM) 25-500 MG TABS tablet Take 1 tablet by mouth at bedtime as needed (sleep).   escitalopram (LEXAPRO) 10 MG tablet Take 10 mg by mouth daily.   glipiZIDE (GLUCOTROL XL) 10 MG 24 hr tablet Take 10 mg by mouth daily with breakfast.   losartan (COZAAR) 100 MG tablet Take 100 mg by mouth daily.   metFORMIN (GLUCOPHAGE-XR) 500 MG 24 hr tablet Take 250 mg by mouth 2 (two) times daily with a meal.    naproxen sodium (ALEVE) 220 MG tablet Take 220 mg by mouth daily as needed (pain).   pantoprazole (PROTONIX) 40 MG tablet Take 40 mg by mouth daily as needed (acid reflux).   sildenafil (VIAGRA) 100 MG tablet Take 100 mg by mouth daily as needed for erectile dysfunction.   simvastatin (ZOCOR) 20 MG tablet Take 2 tablets (40 mg total) by  mouth at bedtime. (Patient taking differently: Take 20 mg by mouth at bedtime.)  Note: does not take sildenafil and protonix  Past Medical History Past Medical History:  Diagnosis Date   Diabetes mellitus without complication ([place])    Environmental allergies    Hx: of   GERD (gastroesophageal reflux disease)    Hypertension    Numbness and tingling    Hx; of  Right shoulder    Past Surgical History Past Surgical History:  Procedure Laterality Date   ANTERIOR CERVICAL DECOMP/DISCECTOMY FUSION N/A 03/17/2013   Procedure: ANTERIOR CERVICAL DECOMPRESSION/DISCECTOMY FUSION 2 LEVEL C5-7;  Surgeon: [name], MD;  Location: [facility];  Service: Orthopedics;  Laterality: N/A;   COLONOSCOPY     Hx: of   HERNIA  REPAIR     TONSILLECTOMY     Social:  Lives With: wife  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   OBJECTIVE:   Physical Exam: Blood pressure 135/79, pulse 100, temperature (!) 97.4 F (36.3 C), temperature source Oral, resp. rate 20, height '5\' 11"'$  (1.803 m), weight 90.7 kg, SpO2 98 %.  Constitutional: alert, in no acute distress HENT: normocephalic atraumatic Eyes: EOM intact Neck: supple Cardiovascular: regular rate and rhythm Pulmonary/Chest: normal work of breathing on room air, lungs clear to auscultation bilaterally Abdominal: soft, non-tender, distended, no guarding MSK: normal bulk and tone Neurological: alert & oriented x 2 to self and time, not place  CN2-12 intact  Finger-to-nose intact   UE sensation intact  UE 5/5 strength   Pronator drift negative  LE sensation intact  LE 5/5 strength  Heel-to-shin intact Skin: warm and dry Psych: normal mood and behavior  Labs: CBC    Component Value Date/Time   WBC 10.0 04/29/2022 1311   RBC 4.96 04/29/2022 1311   HGB 16.3 04/29/2022 1311   HGB 15.6 04/29/2022 1311   HCT 48.0 04/29/2022 1311   HCT 44.6 04/29/2022 1311   PLT 191 04/29/2022 1311   MCV 89.9 04/29/2022 1311   MCH 31.5 04/29/2022 1311   MCHC 35.0 04/29/2022 1311   RDW 12.2 04/29/2022 1311   LYMPHSABS 1.7 04/29/2022 1311   MONOABS 0.5 04/29/2022 1311   EOSABS 0.1 04/29/2022 1311   BASOSABS 0.0 04/29/2022 1311     CMP     Component Value Date/Time   NA 136 04/29/2022 1311   NA 136 04/29/2022 1311   K 3.5 04/29/2022 1311   K 3.5 04/29/2022 1311   CL 99 04/29/2022  1311   CL 100 04/29/2022 1311   CO2 27 04/29/2022 1311   GLUCOSE 216 (H) 04/29/2022 1311   GLUCOSE 222 (H) 04/29/2022 1311   BUN 17 04/29/2022 1311   BUN 16 04/29/2022 1311   CREATININE 1.00 04/29/2022 1311   CREATININE 1.16 04/29/2022 1311   CALCIUM 9.3 04/29/2022 1311   PROT 7.3 04/29/2022 1311   ALBUMIN 4.1 04/29/2022 1311   AST 15 04/29/2022 1311   ALT 17 04/29/2022 1311   ALKPHOS 81 04/29/2022 1311   BILITOT 1.1 04/29/2022 1311   GFRNONAA >60 04/29/2022 1311   GFRAA >60 11/20/2017 0353    Imaging: CT  Head IMPRESSION: Extensive low-density edema centered in the insula bilaterally, left greater than right. This was present on MRI in 2019 but shows significant progression. There is local mass-effect flattening left lateral ventricle. No acute hemorrhage.   There is relative hyperdensity of the MCA bilaterally which is likely accentuated weighted due to surrounding edema in the brain rather than arterial thrombosis.   These nonspecific features could be seen with encephalitis. Consider herpes. Tumor is a consideration however the edema is not particularly masslike and was present 4 years ago although with significant progression.   Also consider PRES given the history of severe hypertension today  EKG: personally reviewed my interpretation is normal sinus rhythm, normal axis. Prior EKG showed sinus rhythm.   ASSESSMENT & PLAN:   Assessment & Plan by Problem: Principal Problem:   Encephalopathy   Issaac Lacock is a 76 y.o. person living with PMH of TIA in 2019, HTN, T2DM, CKD 3 who presented to the ED for confusion and weakness and admitted for encephalopathy on hospital day 0  #Encephalopathy #Hx of TIA in 2019 Patient presents with multiple episodes of staring off and confusion. Family reports these episodes in past 2-3 months with increased frequency the past week. History of TIA in 2019 with MRI head showing edema on left insula and temporal lobe. Worsening  progression with ventricle flattening on CT head. Dr. Malen Gauze evaluated him at ED and concern for mass vs encephalitis vs ischemia. 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 Neuro will consider LP later on after holding plavix.  -MRI head -EEG -ampicillin and ceftriaxone, day 1 -acyclovir, day 1 -pending BC -RPR, HIV  #Hypertensive emergency #HTN Patient presented with BP 220s/120s. Denies any symptoms. He was on nicardipine drip in ED, BP improved. Will restart his home carvedilol and losartan. Goal for MAPs of 80-90.  -restart carvedilol 12.5 mg BID and losartan 100 mg daily -BMP  #T2DM He takes glipizide and metformin at home. Last A1c in 10/2021 was 10.3. -SSI  #CKD 3 Creatinine 1.16. Creatinine in 2019 was 1.14. Continue to monitor.   Diet: NPO VTE: None IVF: LR,125cc/hr Code: Full  Prior to Admission Living Arrangement: Home, living wife Anticipated Discharge Location: Home Barriers to Discharge: encephalopathy workup  Dispo: Admit patient to Inpatient with expected length of stay greater than 2 midnights.  Signed: Angelique Blonder, DO Internal Medicine Resident PGY-1  04/29/2022, 7:47 PM

## 2022-04-29 NOTE — Consult Note (Signed)
Neurology Consultation    Reason for Consult:confusion, abnormal CT  CC: Confusion, staring spells  HISTORY OF PRESENT ILLNESS    History is obtained from: Wife, patient, chart review  HPI   Nas Wafer is a 76 y.o. male with a past medical history of diabetes, GERD, HTN, TIA and numbness and tingling of his right shoulder presenting after an episode of confusion and "staring off into space" while at a restaurant with his wife. She states that about 6 months ago she noticed that his wasn't always making sense in conversation and he started having some memory changes. She states that once he saw a snake on the road and when he tried to explain what he saw he started having nonsensical speech for a few minutes and then it returned to normal. He also had trouble remember the name of his dog that he has had for 10 years and one of his grandchildren who just left for college. Over the last 30-45 days his family states that they have noticed him having staring spells that last about a minute and he does not speak during them. In between episodes he returns to normal. He has not had any balance issues, falls since the TIA in 2019, or difficulty walking. His family states that they have not noticed any overt shaking or seizure like activity.   In 2019 he had a fall with right side weakness and dizziness during this episode. He had a stroke work up with an EEG that showed a left insular hyperintensity on MRI and diffuse slowing on the EEG. He followed with Dr. Leonie Man and [facility] for 2 years after his TIA and was compliant with simvastatin and plavix for stroke prevention.  The patient denies headache, dizziness, visual changes, problems with swallowing or speech, focal muscle weakness, numbness or tingling of her extremities, abnormal movements, or other focal neurological deficits.  ROS: Full ROS was performed and is negative except as noted in the HPI.   PAST MEDICAL HISTORY    Past Medical History:   Past Medical History:  Diagnosis Date   Diabetes mellitus without complication ([place])    Environmental allergies    Hx: of   GERD (gastroesophageal reflux disease)    Hypertension    Numbness and tingling    Hx; of  Right shoulder    No family history on file. Family History  Problem Relation Age of Onset   Diabetes Brother     Allergies:  No Known Allergies  Social History:   reports that he has quit smoking. His smoking use included cigars. He has never used smokeless tobacco. He reports that he does not drink alcohol and does not use drugs.    Medications  Current Facility-Administered Medications:    acyclovir (ZOVIRAX) 900 mg in dextrose 5 % 250 mL IVPB, 900 mg, Intravenous, Q8H, Branham, Jordan Hawks C, MD   cefTRIAXone (ROCEPHIN) 2 g in sodium chloride 0.9 % 100 mL IVPB, 2 g, Intravenous, Once, McCauley, Larry B, PA-C   lactated ringers infusion, , Intravenous, Continuous, Branham, Victoria C, MD   LORazepam (ATIVAN) injection 0.5 mg, 0.5 mg, Intravenous, Once PRN, McCauley, Larry B, PA-C   vancomycin (VANCOREADY) IVPB 2000 mg/400 mL, 2,000 mg, Intravenous, Once **FOLLOWED BY** [START ON 04/30/2022] vancomycin (VANCOREADY) IVPB 1250 mg/250 mL, 1,250 mg, Intravenous, Q12H, Elgie Congo, MD  Current Outpatient Medications:    busPIRone (BUSPAR) 5 MG tablet, Take 5 mg by mouth 3 (three) times daily as needed (anxiety)., Disp: , Rfl:  carvedilol (COREG) 12.5 MG tablet, Take 12.5 mg by mouth 2 (two) times daily with a meal., Disp: , Rfl:    Cetirizine HCl (ZYRTEC PO), Take 10 mg by mouth daily as needed (allergies)., Disp: , Rfl:    clopidogrel (PLAVIX) 75 MG tablet, Take 1 tablet (75 mg total) by mouth daily., Disp: 30 tablet, Rfl: 0   diphenhydramine-acetaminophen (TYLENOL PM) 25-500 MG TABS tablet, Take 1 tablet by mouth at bedtime as needed (sleep)., Disp: , Rfl:    escitalopram (LEXAPRO) 10 MG tablet, Take 10 mg by mouth daily., Disp: , Rfl:    glipiZIDE (GLUCOTROL  XL) 10 MG 24 hr tablet, Take 10 mg by mouth daily with breakfast., Disp: , Rfl:    losartan (COZAAR) 100 MG tablet, Take 100 mg by mouth daily., Disp: , Rfl:    metFORMIN (GLUCOPHAGE-XR) 500 MG 24 hr tablet, Take 250 mg by mouth 2 (two) times daily with a meal. , Disp: , Rfl:    naproxen sodium (ALEVE) 220 MG tablet, Take 220 mg by mouth daily as needed (pain)., Disp: , Rfl:    pantoprazole (PROTONIX) 40 MG tablet, Take 40 mg by mouth daily as needed (acid reflux)., Disp: , Rfl:    sildenafil (VIAGRA) 100 MG tablet, Take 100 mg by mouth daily as needed for erectile dysfunction., Disp: , Rfl:    simvastatin (ZOCOR) 20 MG tablet, Take 2 tablets (40 mg total) by mouth at bedtime. (Patient taking differently: Take 20 mg by mouth at bedtime.), Disp: 30 tablet, Rfl: 0   EXAMINATION    Current vital signs:    04/29/2022    2:55 PM 04/29/2022    2:30 PM 04/29/2022    2:26 PM  Vitals with BMI  Systolic 097 353 299  Diastolic 242 89 97  Pulse  78 77    Examination:  GENERAL: Awake, alert in NAD HEENT: - Normocephalic and atraumatic, dry mm, no lymphadenopathy, no Thyromegally LUNGS - Clear to auscultation bilaterally CV - S1S2 RRR, equal pulses bilaterally. ABDOMEN - Soft, nontender, nondistended with normoactive BS Ext: warm, well perfused, intact peripheral pulses, no pedal edema  NEURO:  Mental Status: AA&Ox3  Language: speech is clear.  Difficulty comprehending and with repetition of complex sentences.  Cranial Nerves:  II: PERRL. Visual fields full to confrontation.  III, IV, VI: EOM in tact. Eyelids elevate symmetrically. Blinks to threat.  V: Sensation intact V1-3 symmetrically  VII: no facial asymmetry   VIII: hearing intact to voice IX, X: Palate elevates symmetrically. Phonation is normal.  AS:[id number] shrug 5/5 and symmetrical  XII: tongue is midline without fasciculations. Motor:  RUE:  5/5     LUE:  5/5 RLE: 5/5       LLE: 5 /5  Tone: is normal and bulk is  normal Sensation- Intact to light touch bilaterally Coordination: FTN intact bilaterally, no ataxia in BLE., no abnormal movements  Gait- deferred   LABS   I have reviewed labs in epic and the results pertinent to this consultation are:  Lab Results  Component Value Date   LDLCALC 114 (H) 11/19/2017   Lab Results  Component Value Date   ALT 17 04/29/2022   AST 15 04/29/2022   ALKPHOS 81 04/29/2022   BILITOT 1.1 04/29/2022   Lab Results  Component Value Date   HGBA1C 6.6 (H) 11/19/2017   Lab Results  Component Value Date   WBC 10.0 04/29/2022   HGB 16.3 04/29/2022   HGB 15.6 04/29/2022   HCT 48.0  04/29/2022   HCT 44.6 04/29/2022   MCV 89.9 04/29/2022   PLT 191 04/29/2022   Lab Results  Component Value Date   NA 136 04/29/2022   NA 136 04/29/2022   K 3.5 04/29/2022   K 3.5 04/29/2022   CL 99 04/29/2022   CL 100 04/29/2022   CO2 27 04/29/2022     DIAGNOSTIC IMAGING/PROCEDURES   I have reviewed the images obtained:, as below    CT-head Extensive low-density edema centered in the insula bilaterally, left greater than right. This was present on MRI in 2019 but shows significant progression. There is local mass-effect flattening left lateral ventricle. No acute hemorrhage. There is relative hyperdensity of the MCA bilaterally which is likely accentuated weighted due to surrounding edema in the brain rather than arterial thrombosis. These nonspecific features could be seen with encephalitis. Consider herpes. Tumor is a consideration however the edema is not particularly masslike and was present 4 years ago although with significant progression. Also consider PRES given the history of severe hypertension today   ASSESSMENT/PLAN    Assessment:  Jorge [name] is a 76 y.o. male with a past medical history of diabetes, GERD, HTN, TIA and numbness and tingling of his right shoulder presenting after an episode of confusion and "staring off into space" while at a  restaurant with his wife. He has had a cognitive decline over the last 6 months that has affected his speech and his memory. Over the last month to month and a half he has had sporadic staring spells. We will plan for an MRI w/ wo contrast to evaluate for a mass and consider an LP to evaluate for HSV encephalitis on Thursday/Friday as well given last plavix dose on 8/21.  We will also obtain an EEG to evaluate for underlying electrographic abnormality.   Impression: Differentials include slow growing mass vs viral/HSV encephalitis vs ischemic infarct Hypertensive emergency/PRES   Recommendations: - MRI Brain w w/o contrast - Consider lumbar puncture if no clear answer with imaging.  He is on Plavix, so we would like to hold it for at least 3 to 4 days before proceeding with this somewhat nonemergent LP. - Hold plavix in anticipation of LP - EEG  - Maintain blood pressure less than 180 - Maintain seizure precautions -Okay to start antibiotics/antiviral treatment for meningitis/encephalitis until some more information from MRI is available.  We will have to wait for an LP due to Plavix. -- Patient seen and examined by NP/APP with MD. MD to update note as needed.   Janine Ores, DNP, FNP-BC [facility] Pager: [phone]   **This documentation was dictated using [place] and may contain inadvertent errors **   Attending Neurohospitalist Addendum Patient seen and examined with APP/Resident. Agree with the history and physical as documented above. Agree with the plan as documented, which I helped formulate. I have independently reviewed the chart, obtained history, review of systems and examined the patient.I have personally reviewed pertinent head/neck/spine imaging (CT/MRI). Please feel free to call with any questions.  -- Amie Portland, MD Neurologist [facility] Pager: [phone]

## 2022-04-29 NOTE — ED Notes (Signed)
EDP at bedside  

## 2022-04-29 NOTE — ED Notes (Signed)
Dr. Beryle Beams at bedside

## 2022-04-29 NOTE — ED Provider Triage Note (Signed)
Emergency Medicine Provider Triage Evaluation Note  Jorge [name] , a 76 y.o. male  was evaluated in triage.  Pt complains of feeling unwell for the past few days.  Per EMS patient was at a restaurant and was "staring off into space" and was confused.  Per triage note, patient was alert and oriented x4 upon EMS arrival.  Patient denies headache, visual changes, unilateral weakness, and speech changes.  Patient's BP elevated upon EMS arrival at 200s/100s.  Patient notes he did not take his BP meds prior to his doctor's appointment. Denies CP.   Review of Systems  Positive: fatigue Negative: CP  Physical Exam  BP (!) 227/123   Pulse 64   Temp 97.6 F (36.4 C) (Oral)   Resp 15   Ht '5\' 11"'$  (1.803 m)   Wt 90.7 kg   SpO2 97%   BMI 27.89 kg/m  Gen:   Awake, no distress   Resp:  Normal effort  MSK:   Moves extremities without difficulty  Other:  AAOx4. Normal speech. Equal grip strength. 5/5 strength all 4 extremities  Medical Decision Making  Medically screening exam initiated at 12:31 PM.  Appropriate orders placed.  Farris Geiman was informed that the remainder of the evaluation will be completed by another provider, this initial triage assessment does not replace that evaluation, and the importance of remaining in the ED until their evaluation is complete.  Stroke labs ordered. Informed charge need for room due to BP; however feel BP is elevated due to not taking his BP medications earlier today. No neurological deficits on exam.    Suzy Bouchard, PA-C 04/29/22 1238

## 2022-04-29 NOTE — ED Notes (Signed)
ED Provider at bedside. 

## 2022-04-29 NOTE — Progress Notes (Addendum)
Pharmacy Antibiotic Note  Jorge [name] is a 76 y.o. male admitted on 04/29/2022 with meningitis due to presentation with altered mental status and CT findings. WBC WNL; afebrile. Pharmacy has been consulted for vancomycin and acyclovir dosing. Neuro is onboard.   Plan: Vancomycin '2000mg'$  IV x1 as loading dose followed by vancomycin '1250mg'$  IV q12hrs (goal trough 15-20) Vanc levels as applicable  Acyclovir '900mg'$  IV q8 hours infused over 1hr  LR @ 125 mL/hr with acyclovir therapy F/u imaging results (MRI) Monitor CBC, culture data, resolution of AMS, LP results, neuro recs, and culture orders.    Height: '5\' 11"'$  (180.3 cm) Weight: 90.7 kg (200 lb) IBW/kg (Calculated) : 75.3  Temp (24hrs), Avg:97.6 F (36.4 C), Min:97.6 F (36.4 C), Max:97.6 F (36.4 C)  Recent Labs  Lab 04/29/22 1311  WBC 10.0  CREATININE 1.16  1.00    Estimated Creatinine Clearance: 72.4 mL/min (by C-G formula based on SCr of 1 mg/dL).    No Known Allergies  Antimicrobials this admission: Acyclovir 8/22> Vanc 8/22> CTX x1 8/22 Ampicillin x1 8/22  Dose adjustments this admission:   Microbiology results: 8/22 Bcx:    Thank you for allowing pharmacy to be a part of this patient's care.  Carolin Guernsey Clinical Pharmacist 04/29/2022 2:39 PM

## 2022-04-29 NOTE — ED Notes (Signed)
Family notified this RN that pt had "another episode"; endorses increased confusion, trying to get out of bed; modified NIH done, pt unable to answer LOC questions; Dr. Rory Percy paged

## 2022-04-29 NOTE — ED Notes (Signed)
Patient transported to CT 

## 2022-04-30 ENCOUNTER — Inpatient Hospital Stay (HOSPITAL_COMMUNITY): Payer: Medicare Other

## 2022-04-30 DIAGNOSIS — Z87891 Personal history of nicotine dependence: Secondary | ICD-10-CM

## 2022-04-30 DIAGNOSIS — Z7984 Long term (current) use of oral hypoglycemic drugs: Secondary | ICD-10-CM

## 2022-04-30 DIAGNOSIS — G9340 Encephalopathy, unspecified: Secondary | ICD-10-CM

## 2022-04-30 DIAGNOSIS — E1165 Type 2 diabetes mellitus with hyperglycemia: Secondary | ICD-10-CM

## 2022-04-30 DIAGNOSIS — I129 Hypertensive chronic kidney disease with stage 1 through stage 4 chronic kidney disease, or unspecified chronic kidney disease: Secondary | ICD-10-CM

## 2022-04-30 DIAGNOSIS — E876 Hypokalemia: Secondary | ICD-10-CM

## 2022-04-30 DIAGNOSIS — C719 Malignant neoplasm of brain, unspecified: Secondary | ICD-10-CM

## 2022-04-30 DIAGNOSIS — G9389 Other specified disorders of brain: Secondary | ICD-10-CM

## 2022-04-30 DIAGNOSIS — Z8673 Personal history of transient ischemic attack (TIA), and cerebral infarction without residual deficits: Secondary | ICD-10-CM

## 2022-04-30 DIAGNOSIS — R569 Unspecified convulsions: Secondary | ICD-10-CM

## 2022-04-30 DIAGNOSIS — R4182 Altered mental status, unspecified: Secondary | ICD-10-CM | POA: Diagnosis not present

## 2022-04-30 DIAGNOSIS — I161 Hypertensive emergency: Secondary | ICD-10-CM

## 2022-04-30 DIAGNOSIS — N1832 Chronic kidney disease, stage 3b: Secondary | ICD-10-CM

## 2022-04-30 DIAGNOSIS — Z794 Long term (current) use of insulin: Secondary | ICD-10-CM

## 2022-04-30 DIAGNOSIS — E1122 Type 2 diabetes mellitus with diabetic chronic kidney disease: Secondary | ICD-10-CM

## 2022-04-30 LAB — MAGNESIUM: Magnesium: 1.6 mg/dL — ABNORMAL LOW (ref 1.7–2.4)

## 2022-04-30 LAB — GLUCOSE, CAPILLARY
Glucose-Capillary: 216 mg/dL — ABNORMAL HIGH (ref 70–99)
Glucose-Capillary: 244 mg/dL — ABNORMAL HIGH (ref 70–99)
Glucose-Capillary: 180 mg/dL — ABNORMAL HIGH (ref 70–99)
Glucose-Capillary: 309 mg/dL — ABNORMAL HIGH (ref 70–99)

## 2022-04-30 LAB — BASIC METABOLIC PANEL
Anion gap: 11 (ref 5–15)
BUN: 14 mg/dL (ref 8–23)
CO2: 24 mmol/L (ref 22–32)
Calcium: 9.1 mg/dL (ref 8.9–10.3)
Chloride: 100 mmol/L (ref 98–111)
Creatinine, Ser: 1.15 mg/dL (ref 0.61–1.24)
GFR, Estimated: >60 mL/min (ref >60)
Glucose, Bld: 217 mg/dL — ABNORMAL HIGH (ref 70–99)
Potassium: 3.2 mmol/L — ABNORMAL LOW (ref 3.5–5.1)
Sodium: 135 mmol/L (ref 135–145)

## 2022-04-30 LAB — RPR: RPR Ser Ql: NONREACTIVE

## 2022-04-30 MED ORDER — ORAL CARE MOUTH RINSE: 15.0000 mL | OROMUCOSAL | Status: DC | PRN

## 2022-04-30 MED ORDER — MAGNESIUM SULFATE 2 GM/50ML IV SOLN
2.0000 g | Freq: Once | INTRAVENOUS | Status: AC
  Administered 2022-04-30: 2 g via INTRAVENOUS
  Filled 2022-04-30: qty 50

## 2022-04-30 MED ORDER — DEXAMETHASONE SODIUM PHOSPHATE 4 MG/ML IJ SOLN
4.0000 mg | Freq: Four times a day (QID) | INTRAMUSCULAR | Status: AC
  Administered 2022-04-30 – 2022-05-01 (×4): 4 mg via INTRAVENOUS
  Filled 2022-04-30 (×4): qty 1

## 2022-04-30 MED ORDER — POTASSIUM CHLORIDE CRYS ER 20 MEQ PO TBCR: 40.0000 meq | EXTENDED_RELEASE_TABLET | Freq: Two times a day (BID) | ORAL | Status: DC

## 2022-04-30 MED ORDER — INSULIN GLARGINE-YFGN 100 UNIT/ML ~~LOC~~ SOLN
10.0000 [IU] | Freq: Every day | SUBCUTANEOUS | Status: DC
Start: 2022-04-30 — End: 2022-05-01
  Administered 2022-04-30 – 2022-05-01 (×2): 10 [IU] via SUBCUTANEOUS
  Filled 2022-04-30 (×2): qty 0.1

## 2022-04-30 MED ORDER — POTASSIUM CHLORIDE CRYS ER 20 MEQ PO TBCR
40.0000 meq | EXTENDED_RELEASE_TABLET | Freq: Two times a day (BID) | ORAL | Status: AC
Start: 2022-04-30 — End: 2022-04-30
  Administered 2022-04-30 (×2): 40 meq via ORAL
  Filled 2022-04-30 (×2): qty 2

## 2022-04-30 MED ORDER — OLANZAPINE 2.5 MG PO TABS
2.5000 mg | ORAL_TABLET | Freq: Once | ORAL | Status: AC
  Administered 2022-04-30: 2.5 mg via ORAL
  Filled 2022-04-30: qty 1

## 2022-04-30 NOTE — Plan of Care (Signed)

## 2022-04-30 NOTE — Progress Notes (Signed)
MRI reviewed. See note from Dr. Curly Shores overnight. 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  Showed family the images. Patient more confused today.  Unable to recognize family members.  EEG completed-no seizures   Impression Brain mass  Recs: Neurosurgical and oncological consultation Steroids per neurosurgery Continue antiepileptics for now Please call neurology as needed.  -- Amie Portland, MD Neurologist [facility] Pager: [phone]

## 2022-04-30 NOTE — Progress Notes (Addendum)
HD#1 Subjective:   Summary: [name] is a 76 year old male with PMH of TIA in 2019, HTN, T2DM, CKD 3 that presents for confusion and weakness.  Overnight Events: Patient was restless and not redirectable by nursing or family even with mitts provided, gave zyprexa 2.5 mg once  Jorge [name] appears more confused this morning. He is trying to get out of his bed. Alert and oriented to person and time. Denies any new concerns. Family present at bedside. Discussed MRI findings.   Objective:  Vital signs in last 24 hours: Vitals:   04/29/22 2230 04/29/22 2247 04/29/22 2321 04/30/22 0312  BP:  (!) 173/95 (!) 165/109 (!) 160/91  Pulse: 90 94 86 81  Resp:  '20 18 18  '$ Temp:  (!) 97.5 F (36.4 C) (!) 97.4 F (36.3 C) 97.9 F (36.6 C)  TempSrc:  Oral Oral Oral  SpO2: 98% 99% 100% 99%  Weight:      Height:       Supplemental O2: Room Air SpO2: 99 %   Physical Exam:  Constitutional: alert, laying in bed, in no acute distress HENT: normocephalic atraumatic, mucous membranes dry Neck: supple Cardiovascular: regular rate and rhythm Pulmonary/Chest: normal work of breathing on room air MSK: normal bulk and tone Neurological: alert & oriented to self and time, not place or situation  Skin: warm and dry Psych: pleasant, normal mood  Filed Weights   04/29/22 1159  Weight: 90.7 kg    No intake or output data in the 24 hours ending 04/30/22 0832 Net IO Since Admission: No IO data has been entered for this period [04/30/22 0832]  Pertinent Labs:    Latest Ref Rng & Units 04/29/2022    1:11 PM 11/18/2017   11:58 PM 03/16/2013    3:25 PM  CBC  WBC 4.0 - 10.5 K/uL 10.0  8.0  9.6   Hemoglobin 13.0 - 17.0 g/dL 13.0 - 17.0 g/dL 16.3    15.6  13.2  15.3   Hematocrit 39.0 - 52.0 % 39.0 - 52.0 % 48.0    44.6  38.7  43.0   Platelets 150 - 400 K/uL 191  181  185        Latest Ref Rng & Units 04/29/2022    1:11 PM 11/20/2017    3:53 AM 11/18/2017   11:58 PM  CMP  Glucose 70 - 99  mg/dL 70 - 99 mg/dL 216    222  135  145   BUN 8 - 23 mg/dL 8 - 23 mg/dL '17    16  13  20   '$ Creatinine 0.61 - 1.24 mg/dL 0.61 - 1.24 mg/dL 1.00    1.16  1.14  1.56   Sodium 135 - 145 mmol/L 135 - 145 mmol/L 136    136  136  136   Potassium 3.5 - 5.1 mmol/L 3.5 - 5.1 mmol/L 3.5    3.5  3.6  4.0   Chloride 98 - 111 mmol/L 98 - 111 mmol/L 99    100  106  105   CO2 22 - 32 mmol/L '27  21  21   '$ Calcium 8.9 - 10.3 mg/dL 9.3  8.6  8.9   Total Protein 6.5 - 8.1 g/dL 7.3   7.4   Total Bilirubin 0.3 - 1.2 mg/dL 1.1   1.0   Alkaline Phos 38 - 126 U/L 81   83   AST 15 - 41 U/L 15   29  ALT 0 - 44 U/L 17   25     Imaging: MR BRAIN W WO CONTRAST  Result Date: 04/29/2022 CLINICAL DATA:  Altered mental status EXAM: MRI HEAD WITHOUT AND WITH CONTRAST TECHNIQUE: Multiplanar, multiecho pulse sequences of the brain and surrounding structures were obtained without and with intravenous contrast. CONTRAST:  31m GADAVIST GADOBUTROL 1 MMOL/ML IV SOLN COMPARISON:  11/19/2017 FINDINGS: Brain: There is masslike hyperintense T2-weighted signal involving the left-greater-than-right insula and temporal lobes, crossing the midline at the anterior commissure. There is no contrast enhancement. There is mild mass effect on the left lateral ventricle. No abnormal diffusion restriction. No hemorrhage. Vascular: Normal flow voids Skull and upper cervical spine: Normal marrow signal. Sinuses/Orbits: Chronic right maxillary sinusitis.  Normal orbits. Other: None IMPRESSION: 1. Masslike hyperintense T2-weighted signal involving the left-greater-than-right insula and temporal lobes. 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 Electronically Signed   By: KUlyses JarredM.D.   On: 04/29/2022 20:04   CT HEAD WO CONTRAST  Result Date: 04/29/2022 CLINICAL DATA:  Acute neuro deficit. Aphasia. Severe hypertension today. EXAM: CT HEAD WITHOUT CONTRAST TECHNIQUE: Contiguous axial images were obtained from the base  of the skull through the vertex without intravenous contrast. RADIATION DOSE REDUCTION: This exam was performed according to the departmental dose-optimization program which includes automated exposure control, adjustment of the mA and/or kV according to patient size and/or use of iterative reconstruction technique. COMPARISON:  CT head 11/24/2017.  MRI head 11/19/2017 FINDINGS: Brain: Extensive low-density edema is present bilaterally, left greater than right. This is centered in the left insula but extends into the left basal ganglia and left frontal and temporal lobe. This shows significant progression compared with 2019 MRI. There is also edema in the deep white matter tracks on the right extending into the right insula. Ventricle size normal. Mild flattening of the left lateral ventricle due to edema. No acute hemorrhage. Vascular: Middle cerebral arteries shows relative hyperdensity diffusely and bilaterally. This is likely due to surrounding brain edema. Skull: Negative Sinuses/Orbits: Complete opacification right maxillary sinus. Mucosal edema right frontal and ethmoid sinus. Bilateral cataract extraction Other: None IMPRESSION: Extensive low-density edema centered in the insula bilaterally, left greater than right. This was present on MRI in 2019 but shows significant progression. There is local mass-effect flattening left lateral ventricle. No acute hemorrhage. There is relative hyperdensity of the MCA bilaterally which is likely accentuated weighted due to surrounding edema in the brain rather than arterial thrombosis. These nonspecific features could be seen with encephalitis. Consider herpes. Tumor is a consideration however the edema is not particularly masslike and was present 4 years ago although with significant progression. Also consider PRES given the history of severe hypertension today These results were called by telephone at the time of interpretation on 04/29/2022 at 2:28 pm to provider  BNechama Guard who verbally acknowledged these results. Electronically Signed   By: CFranchot GalloM.D.   On: 04/29/2022 14:30    Assessment/Plan:   Principal Problem:   Encephalopathy   Patient Summary: TUrban [name] a 77y.o. with a pertinent PMH of TIA in 2019 on plavix, HTN, T2DM, CKD 3 who presented with confusion and weakness and admitted for encephalopathy secondary to newly diagnosed astrocytoma.   Low grade diffuse astrocytoma Acute Encephalopathy Hx of TIA in 2019 MRI showed masslike hyperintensity of left-greater-than-right insula and temporal lobes consistent with low grade diffuse astrocytoma. Antimicrobials discontinued. RPR and HIV negative. Consulted neurosurgery and neuro oncology for further evaluation. EEG results  pending. Will continue renally dosed Keppra. Appreciate neurology assistance with his care.  -continue Keppra 500 mg BID -Neurosurgery will see him today -Neuro-oncology will see him tomorrow afternoon  Hypertensive emergency HTN BP elevated in 160/90s this morning with MAPs of 110s. Systolic BP stayed <677 overnight. Has not taken carvedilol since Monday and received losartan late last night. Monitor MAPs with goal of 80-90.  -continue losartan 100 mg daily and carvedilol 12.5 mg BID  T2DM uncontrolled with hyperglycemia A1c 7.6. He is on glipizide and metformin at home. Will continue semglee and SSI. 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 Continue to monitor.  Hypokalemia Potassium 3.2. Pending magnesium. Gave 2 doses of 40 mEq potassium.  -repeat BMP, replete if needed  Diet: Carb-Modified IVF: None,None VTE: SCDs Code: Full PT/OT recs: None, none. Family Update: family at bedside, discussed findings and plan   Dispo: pending astrocytoma evaluation by neurosurgery and neuro oncology  Angelique Blonder, DO Internal Medicine Resident PGY-1 Please contact the on call pager after 5 pm and on weekends at [phone].

## 2022-04-30 NOTE — Procedures (Signed)
Patient Name: Jorge [name]  MRN: [id number]  Epilepsy Attending: Lora Havens  Referring Physician/Provider: Janine Ores, NP Date: 04/30/2022  Duration: 21.06 mins  Patient history:  75 y.o. male with a past medical history of diabetes, GERD, HTN, TIA and numbness and tingling of his right shoulder presenting after an episode of confusion and "staring off into space" while at a restaurant with his wife. EEG to evaluate for seizure.  Level of alertness: Awake  AEDs during EEG study: LEV  Technical aspects: This EEG study was done with scalp electrodes positioned according to the 10-20 International system of electrode placement. Electrical activity was reviewed with band pass filter of 1-'70Hz'$ , sensitivity of 7 uV/mm, display speed of 61m/sec with a '60Hz'$  notched filter applied as appropriate. EEG data were recorded continuously and digitally stored.  Video monitoring was available and reviewed as appropriate.  Description: No clear posterior dominant rhythm was seen. EEG showed continuous generalized 3 to 6 Hz theta-delta slowing. Hyperventilation and photic stimulation were not performed.     Of note, study was technically difficult due to significant myogenic and electrode artifact.  ABNORMALITY - Continuous slow, generalized  IMPRESSION: This technically difficult study is suggestive of moderate diffuse encephalopathy, nonspecific etiology. No seizures or epileptiform discharges were seen throughout the recording.  A normal interictal EEG does not exclude nor support the diagnosis of epilepsy. If suspicion for interictal activity remains a concern, a prolonged study can be considered.    Rufina Kimery OBarbra Sarks

## 2022-04-30 NOTE — Consult Note (Signed)
Reason for Consult:bilateral masses Referring Physician: hospitalists  Jorge [name] is an 76 y.o. male.   HPI:  76 year old male presented to the hospital yesterday with intermittent episodes of "blank stares."  His wife is the historian.  She states these blank stares started happening a couple weeks ago.  However, 6 months ago he started to become a little more confused.  He wanted to sleep all the time.  Has a history of what they thought was TIA 4 years ago when he had a facial droop.  His wife states that seizure-like episodes last anywhere from 5 to 20 minutes.  Patient denies any headaches nausea vomiting dizziness or vision changes.   Past Medical History:  Diagnosis Date   Diabetes mellitus without complication (HCC)    Environmental allergies    Hx: of   GERD (gastroesophageal reflux disease)    Hypertension    Numbness and tingling    Hx; of  Right shoulder    Past Surgical History:  Procedure Laterality Date   ANTERIOR CERVICAL DECOMP/DISCECTOMY FUSION N/A 03/17/2013   Procedure: ANTERIOR CERVICAL DECOMPRESSION/DISCECTOMY FUSION 2 LEVEL C5-7;  Surgeon: [name], MD;  Location: [facility];  Service: Orthopedics;  Laterality: N/A;   COLONOSCOPY     Hx: of   HERNIA REPAIR     TONSILLECTOMY      No Known Allergies  Social History   Tobacco Use   Smoking status: Former    Types: Cigars   Smokeless tobacco: Never  Substance Use Topics   Alcohol use: No    Family History  Problem Relation Age of Onset   Diabetes Brother      Review of Systems  Positive ROS: As above  All other systems have been reviewed and were otherwise negative with the exception of those mentioned in the HPI and as above.  Objective: Vital signs in last 24 hours: Temp:  [97.4 F (36.3 C)-99.1 F (37.3 C)] 99.1 F (37.3 C) (08/23 1241) Pulse Rate:  [77-100] 85 (08/23 1241) Resp:  [13-25] 18 (08/23 1241) BP: (120-217)/(69-129) 120/78 (08/23 1241) SpO2:  [95 %-100 %] 97 % (08/23  1241)  General Appearance: Alert, cooperative, no distress, appears stated age Head: Normocephalic, without obvious abnormality, atraumatic Eyes: PERRL, conjunctiva/corneas clear, EOM's intact, fundi benign, both eyes      Lungs: respirations unlabored Heart: Regular rate and rhythm Pulses: 2+ and symmetric all extremities Skin: Skin color, texture, turgor normal, no rashes or lesions  NEUROLOGIC:   Mental status: A&O x4, no aphasia, good attention span, Memory and fund of knowledge Motor Exam - grossly normal, normal tone and bulk Sensory Exam - grossly normal Reflexes: symmetric, no pathologic reflexes, No Hoffman's, No clonus Coordination - grossly normal Gait -not tested Balance -not tested Cranial Nerves: I: smell Not tested  II: visual acuity  OS: na    OD: na  II: visual fields Full to confrontation  II: pupils Equal, round, reactive to light  III,VII: ptosis None  III,IV,VI: extraocular muscles  Full ROM  V: mastication Normal  V: facial light touch sensation  Normal  V,VII: corneal reflex  Present  VII: facial muscle function - upper  Normal  VII: facial muscle function - lower Normal  VIII: hearing Not tested  IX: soft palate elevation  Normal  IX,X: gag reflex Present  XI: trapezius strength  5/5  XI: sternocleidomastoid strength 5/5  XI: neck flexion strength  5/5  XII: tongue strength  Normal    Data Review Lab  Results  Component Value Date   WBC 10.0 04/29/2022   HGB 16.3 04/29/2022   HGB 15.6 04/29/2022   HCT 48.0 04/29/2022   HCT 44.6 04/29/2022   MCV 89.9 04/29/2022   PLT 191 04/29/2022   Lab Results  Component Value Date   NA 135 04/30/2022   K 3.2 (L) 04/30/2022   CL 100 04/30/2022   CO2 24 04/30/2022   BUN 14 04/30/2022   CREATININE 1.15 04/30/2022   GLUCOSE 217 (H) 04/30/2022   Lab Results  Component Value Date   INR 1.0 04/29/2022    Radiology: MR BRAIN W WO CONTRAST  Result Date: 04/29/2022 CLINICAL DATA:  Altered mental  status EXAM: MRI HEAD WITHOUT AND WITH CONTRAST TECHNIQUE: Multiplanar, multiecho pulse sequences of the brain and surrounding structures were obtained without and with intravenous contrast. CONTRAST:  64m GADAVIST GADOBUTROL 1 MMOL/ML IV SOLN COMPARISON:  11/19/2017 FINDINGS: Brain: There is masslike hyperintense T2-weighted signal involving the left-greater-than-right insula and temporal lobes, crossing the midline at the anterior commissure. There is no contrast enhancement. There is mild mass effect on the left lateral ventricle. No abnormal diffusion restriction. No hemorrhage. Vascular: Normal flow voids Skull and upper cervical spine: Normal marrow signal. Sinuses/Orbits: Chronic right maxillary sinusitis.  Normal orbits. Other: None IMPRESSION: 1. Masslike hyperintense T2-weighted signal involving the left-greater-than-right insula and temporal lobes. 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 Electronically Signed   By: KUlyses JarredM.D.   On: 04/29/2022 20:04   CT HEAD WO CONTRAST  Result Date: 04/29/2022 CLINICAL DATA:  Acute neuro deficit. Aphasia. Severe hypertension today. EXAM: CT HEAD WITHOUT CONTRAST TECHNIQUE: Contiguous axial images were obtained from the base of the skull through the vertex without intravenous contrast. RADIATION DOSE REDUCTION: This exam was performed according to the departmental dose-optimization program which includes automated exposure control, adjustment of the mA and/or kV according to patient size and/or use of iterative reconstruction technique. COMPARISON:  CT head 11/24/2017.  MRI head 11/19/2017 FINDINGS: Brain: Extensive low-density edema is present bilaterally, left greater than right. This is centered in the left insula but extends into the left basal ganglia and left frontal and temporal lobe. This shows significant progression compared with 2019 MRI. There is also edema in the deep white matter tracks on the right extending into the right  insula. Ventricle size normal. Mild flattening of the left lateral ventricle due to edema. No acute hemorrhage. Vascular: Middle cerebral arteries shows relative hyperdensity diffusely and bilaterally. This is likely due to surrounding brain edema. Skull: Negative Sinuses/Orbits: Complete opacification right maxillary sinus. Mucosal edema right frontal and ethmoid sinus. Bilateral cataract extraction Other: None IMPRESSION: Extensive low-density edema centered in the insula bilaterally, left greater than right. This was present on MRI in 2019 but shows significant progression. There is local mass-effect flattening left lateral ventricle. No acute hemorrhage. There is relative hyperdensity of the MCA bilaterally which is likely accentuated weighted due to surrounding edema in the brain rather than arterial thrombosis. These nonspecific features could be seen with encephalitis. Consider herpes. Tumor is a consideration however the edema is not particularly masslike and was present 4 years ago although with significant progression. Also consider PRES given the history of severe hypertension today These results were called by telephone at the time of interpretation on 04/29/2022 at 2:28 pm to provider BNechama Guard who verbally acknowledged these results. Electronically Signed   By: CFranchot GalloM.D.   On: 04/29/2022 14:30     Assessment/Plan: 76year old male  presented to the ED with seizure-like episodes over the last couple weeks.  MRI brain shows masslike hyperintense signal in the left and right temporal lobes which is likely consistent with a low-grade astrocytoma.  Placed on Keppra and Decadron.  Dr. Marcello Moores will see him tomorrow and be his primary neurosurgeon.  Recommend radiation oncology input.  We will make sure he is on tumor board for Monday.  Ocie Cornfield [facility] 04/30/2022 3:48 PM

## 2022-04-30 NOTE — Plan of Care (Signed)
  Problem: Education: Goal: Knowledge of General Education information will improve Description: Including pain rating scale, medication(s)/side effects and non-pharmacologic comfort measures Outcome: Progressing   Problem: Health Behavior/Discharge Planning: Goal: Ability to manage health-related needs will improve Outcome: Progressing   Problem: Clinical Measurements: Goal: Ability to maintain clinical measurements within normal limits will improve Outcome: Progressing Goal: Will remain free from infection Outcome: Progressing Goal: Diagnostic test results will improve Outcome: Progressing Goal: Respiratory complications will improve Outcome: Progressing Goal: Cardiovascular complication will be avoided Outcome: Progressing   Problem: Activity: Goal: Risk for activity intolerance will decrease Outcome: Progressing   Problem: Nutrition: Goal: Adequate nutrition will be maintained Outcome: Progressing   Problem: Coping: Goal: Level of anxiety will decrease Outcome: Progressing   Problem: Elimination: Goal: Will not experience complications related to bowel motility Outcome: Progressing Goal: Will not experience complications related to urinary retention Outcome: Progressing   Problem: Pain Managment: Goal: General experience of comfort will improve Outcome: Progressing   Problem: Safety: Goal: Ability to remain free from injury will improve Outcome: Progressing   Problem: Skin Integrity: Goal: Risk for impaired skin integrity will decrease Outcome: Progressing   Problem: Education: Goal: Knowledge of secondary prevention will improve (SELECT ALL) Outcome: Progressing Goal: Knowledge of patient specific risk factors will improve (INDIVIDUALIZE FOR PATIENT) Outcome: Progressing

## 2022-04-30 NOTE — Progress Notes (Signed)
  Transition of Care [facility]) Screening Note   Patient Details  Name: Jorge [name] Date of Birth: 11/13/1945   Transition of Care [facility]) CM/SW Contact:    Pollie Friar, RN Phone Number: 04/30/2022, 2:24 PM    Transition of Care Department [facility]) has reviewed patient. We will continue to monitor patient advancement through interdisciplinary progression rounds. If new patient transition needs arise, please place a TOC consult.

## 2022-04-30 NOTE — Progress Notes (Signed)
Received on  cart from ED accompanied by nurse and wife.  Oriented to room and was given a patient handbook detailing patient rights, responsibilities, and visitor guidelines.

## 2022-04-30 NOTE — Progress Notes (Signed)
Called MD because pt keeps trying to get out of the bed and is becoming aggressive and threatening to leave.  Wants to take out IV and go home.  Is not redirectable and neither wife or this nurse can calm him.  Dr ordered zyprexa and delirium protocol.

## 2022-04-30 NOTE — Progress Notes (Signed)
EEG complete - results pending 

## 2022-04-30 NOTE — Hospital Course (Addendum)
Low grade diffuse astrocytoma Acute Encephalopathy Hx of TIA in 2019 Patient presented with staring spells for the past few months with increased frequency the week prior to admission. The staring lasts for a few minutes and he is unresponsive. He would then return to baseline right after. Family noted change in mental status and increased confusion. History of TIA in 2019 which family believes he had some staring episodes as well. MRI showed masslike hyperintensity of left-greater-than-right insula and temporal lobes consistent with low grade diffuse astrocytoma. Neurology evaluated him and started him on renally dosed Keppra. EEG showed no seizure activity. Neurosurgery plans for biopsy on 8/30. Neuro-oncology consulted and will follow. He was also started on dexamethasone daily. His mentation improved with steroid likely addressing the cerebral edema. Family notes he is close to baseline prior to the events leading to admission. On day of discharge, he was alert and oriented x 3 with no focal neuro deficits.   HTN Patient presented with hypertensive emergency BP >210/120. Was started on nicardipine drip in ED with good response. He was restarted on losartan and carvedilol. BP improved over course. He was noted to have bradycardia overnight and carvedilol was held. Plan to stop carvedilol and start amlodipine at discharge with outpatient follow up. At that visit, can decide if continue amlodipine or restart carvedilol.    T2DM uncontrolled with hyperglycemia A1c 7.6. He was started on daily dexamethasone. Required long acting and meal-time insulin. Improved glycemic control by the time of discharge. He will need to go home with long acting and continue his metformin. Discussed stopping his glipizide. Family is comfortable assisting with insulin injections since they have family with T1DM and aware of how to use insulin.    AKI on CKD3b Creatinine increased during hospital course. Likely setting of  prior poor po intake. Gave IV fluids and he had improved po intake. No issue with voiding and had good urine output. On day of discharge, creatinine trending down.    Hypokalemia Hypomagnesemia  Potassium dropped to 3.2, magnesium 1.6. Replenished both during hospital course and repeat showed normal levels.

## 2022-04-30 NOTE — Progress Notes (Signed)
Is calmer but still fidgety.  Has not gotten out of bed but still picking at mittens and saying he needs to get up and leave.  Wife at side and assisting to redirect and try to calm him.

## 2022-05-01 ENCOUNTER — Other Ambulatory Visit: Payer: Self-pay | Admitting: Neurosurgery

## 2022-05-01 ENCOUNTER — Inpatient Hospital Stay (HOSPITAL_COMMUNITY): Payer: Medicare Other

## 2022-05-01 DIAGNOSIS — G9340 Encephalopathy, unspecified: Secondary | ICD-10-CM | POA: Diagnosis not present

## 2022-05-01 LAB — GLUCOSE, CAPILLARY
Glucose-Capillary: 363 mg/dL — ABNORMAL HIGH (ref 70–99)
Glucose-Capillary: 449 mg/dL — ABNORMAL HIGH (ref 70–99)
Glucose-Capillary: 392 mg/dL — ABNORMAL HIGH (ref 70–99)
Glucose-Capillary: 346 mg/dL — ABNORMAL HIGH (ref 70–99)
Glucose-Capillary: 242 mg/dL — ABNORMAL HIGH (ref 70–99)

## 2022-05-01 LAB — BASIC METABOLIC PANEL
Anion gap: 6 (ref 5–15)
BUN: 19 mg/dL (ref 8–23)
CO2: 22 mmol/L (ref 22–32)
Calcium: 8.8 mg/dL — ABNORMAL LOW (ref 8.9–10.3)
Chloride: 106 mmol/L (ref 98–111)
Creatinine, Ser: 1.28 mg/dL — ABNORMAL HIGH (ref 0.61–1.24)
GFR, Estimated: 58 mL/min — ABNORMAL LOW (ref >60)
Glucose, Bld: 202 mg/dL — ABNORMAL HIGH (ref 70–99)
Potassium: 4 mmol/L (ref 3.5–5.1)
Sodium: 134 mmol/L — ABNORMAL LOW (ref 135–145)

## 2022-05-01 LAB — MAGNESIUM: Magnesium: 2.1 mg/dL (ref 1.7–2.4)

## 2022-05-01 MED ORDER — INSULIN ASPART 100 UNIT/ML IJ SOLN: 0.0000 [IU] | Freq: Three times a day (TID) | INTRAMUSCULAR | Status: DC

## 2022-05-01 MED ORDER — INSULIN ASPART 100 UNIT/ML IJ SOLN
0.0000 [IU] | Freq: Three times a day (TID) | INTRAMUSCULAR | Status: DC
  Administered 2022-05-01: 9 [IU] via SUBCUTANEOUS

## 2022-05-01 MED ORDER — DEXAMETHASONE SODIUM PHOSPHATE 4 MG/ML IJ SOLN: 4.0000 mg | INTRAMUSCULAR | Status: DC

## 2022-05-01 MED ORDER — INSULIN ASPART 100 UNIT/ML IJ SOLN
0.0000 [IU] | Freq: Every day | INTRAMUSCULAR | Status: DC
  Administered 2022-05-01: 4 [IU] via SUBCUTANEOUS
  Administered 2022-05-02 – 2022-05-03 (×2): 3 [IU] via SUBCUTANEOUS

## 2022-05-01 MED ORDER — INSULIN GLARGINE-YFGN 100 UNIT/ML ~~LOC~~ SOLN
20.0000 [IU] | Freq: Every day | SUBCUTANEOUS | Status: DC
Start: 2022-05-02 — End: 2022-05-04
  Administered 2022-05-02 – 2022-05-04 (×3): 20 [IU] via SUBCUTANEOUS
  Filled 2022-05-01 (×3): qty 0.2

## 2022-05-01 MED ORDER — INSULIN ASPART 100 UNIT/ML IJ SOLN
0.0000 [IU] | INTRAMUSCULAR | Status: DC
  Administered 2022-05-01: 14 [IU] via SUBCUTANEOUS
  Administered 2022-05-01: 20 [IU] via SUBCUTANEOUS
  Administered 2022-05-01: 7 [IU] via SUBCUTANEOUS
  Administered 2022-05-02: 3 [IU] via SUBCUTANEOUS
  Administered 2022-05-02: 4 [IU] via SUBCUTANEOUS

## 2022-05-01 MED ORDER — GADOBUTROL 1 MMOL/ML IV SOLN
9.5000 mL | Freq: Once | INTRAVENOUS | Status: AC | PRN
  Administered 2022-05-01: 9.5 mL via INTRAVENOUS

## 2022-05-01 MED ORDER — INSULIN ASPART 100 UNIT/ML IJ SOLN
10.0000 [IU] | Freq: Once | INTRAMUSCULAR | Status: AC
Start: 2022-05-01 — End: 2022-05-01
  Administered 2022-05-01: 10 [IU] via SUBCUTANEOUS

## 2022-05-01 NOTE — Progress Notes (Signed)
Neurosurgery  I had a long discussion with the patient, wife, and son.  I discussed the findings of diffuse infiltrating CNS process, with suspicion for glioma or other neoplasm such as lymphoma, with inflammatory or encephalitic process being significantly less likely.  Given suspicion for neoplasm, stereotactic brain biopsy would best provide diagnosis and allow for subsequent treatment plan.  I discussed the general technique of surgery, as well as risk, benefits, alternatives, and expected convalescence.  Risk discussed included, but were not limited to, bleeding, pain, infection, scar, nondiagnostic tissue, neurologic deficit, coma, and death.  I discussed with them that nonenhancing lesions have higher rates of nondiagnostic tissue, and that if stereotactic biopsy is not fruitful, he would need a small craniotomy for a excisional biopsy.  He will need to continue to hold the Plavix we will plan for procedure likely August 30th.  He will need a BrainLab/Stealth protocol MRI for preoperative planning.  The family wishes for him to remain in the hospital given he has significant functional and cognitive deficits still, though it has significantly improved since dexamethasone was initiated.  If he has significant improvement, it is possible he may be able to be discharged and returned for the procedure next week.  All questions and concerns were answered.

## 2022-05-01 NOTE — Progress Notes (Signed)
Subjective:   Family at the bedside. Patient's confusion appears to have improved slightly today.  Yesterday the patient was only oriented to self and time.  Today the patient was able to correctly tell me his name, the year, and that he was currently in the hospital. He was unaware that we are at [facility].  He was unsure of what we are treating for him despite being reminded by family. He states that he is feeling somewhat better and that he slept well overnight.   Objective:  Vital signs in last 24 hours: Vitals:   05/01/22 0036 05/01/22 0308 05/01/22 0729 05/01/22 1141  BP: 124/83 (!) 146/72 (!) 163/90 122/75  Pulse: 72 78 88 80  Resp: 18 20    Temp: 97.9 F (36.6 C) 98 F (36.7 C)  97.7 F (36.5 C)  TempSrc: Oral Oral  Oral  SpO2: 96% 94% 97% 96%  Weight:      Height:       Constitutional: alert, laying in bed. Not in acute distress. HENT: EOMI. Left pupil reactive to light. Right pupil does not constrict to light. Pupils 3 mm bilaterally.  Cardiovascular: regular rate and rhythm. No murmurs, rubs, or gallops.  Pulmonary: normal work of breathing. No wheezes, rales, or rhonchi.  MSK: no LE edema bilaterally.  Neurological: alert & oriented to person, situation, and time. Not oriented to place. No focal deficit. Normal strength bilaterally. Normal sensation bilaterally.  Skin: warm and dry    Assessment/Plan:  Principal Problem:   Encephalopathy Active Problems:   Essential hypertension   Type 2 diabetes mellitus with stage 3b chronic kidney disease, without long-term current use of insulin (HCC)   Stage 3b chronic kidney disease ([place])   Brain mass  Jorge [name] is a 76 y/o male with a past medical history of TIA (2019, on Plavix), HTN, T2DM, and CKD3 that presented with confusion and weakness and was admitted for encephalopathy likely secondary to newly diagnosed astrocytoma.    # Low grade diffuse astrocytoma # Acute Encephalopathy # Hx of TIA in  2019 Brain MRI demonstrates mass-like hyperintensity of left-greater-than-right insula and temporal lobes consistent with low grade diffuse astrocytoma. EEG negative for seizure activity. RPR and HIV negative. Neurosurgery consulted and recommends neuro-oncology consult and to hold Plavix. Patient has received 3 of 4 doses of 4 mg decadron. Last decadron dose scheduled for this evening.  - Keppra 500 mg BID - Continue holding Plavix - Awaiting Neuro-oncology recommendations   2. # Hypertension Patient was hypertensive this morning at 163/90.  Blood pressure now improved to 122/75. Patient has several normal blood pressure readings over the last 24 hours. MAP goal of 80-90.   - Losartan 100 mg daily - Carvedilol 12.5 mg BID - Continue monitoring BP   3. # Uncontrolled T2DM w/ hyperglycemia A1c 7.6. On glipizide and metformin at home.  Several blood sugar readings today in the 300s with the highest reading at 449.  Likely secondary to Decadron. - Increase semglee to 20 units daily - Switch Sliding Scale from with meals to q4h - CBG monitoring q4h   4. # CKD3b Creatinine mildly elevated today at 1.28. - Trend BMP   5. # Hypokalemia Patient was given 2X doses of 40 mEq potassium and 2 g of IV magnesium sulfate yesterday. Potassium normal at 4.0 today.  Magnesium normal at 2.1. - Trend BMP   Diet: Carb-Modified Bowel: None IVF: None VTE: SCDs Code: Full  Prior to Admission Living Arrangement:  home with family Anticipated Discharge Location: home with family Barriers to Discharge: continued management Dispo: Anticipated discharge in approximately more than 2 day(s).   Starlyn Skeans, MD 05/01/2022, 1:02 PM Pager: [phone] After 5pm on weekdays and 1pm on weekends: On Call pager [phone]

## 2022-05-01 NOTE — Plan of Care (Signed)
  Problem: Clinical Measurements: Goal: Diagnostic test results will improve Outcome: Progressing   Problem: Activity: Goal: Risk for activity intolerance will decrease Outcome: Progressing   Problem: Coping: Goal: Level of anxiety will decrease Outcome: Progressing   Problem: Safety: Goal: Ability to remain free from injury will improve Outcome: Progressing

## 2022-05-01 NOTE — Consult Note (Signed)
[facility] Consult Note  Patient Care Team: Goins, Gillis Santa, FNP as PCP - General (Family Medicine)  CHIEF COMPLAINTS/PURPOSE OF CONSULTATION:  Bi-temporal Mass  HISTORY OF PRESENTING ILLNESS:  Jorge [name] 76 y.o. male presented with several weeks of episodes of several minutes of staring, followed by confusion.  First event actually dates back to 1 year ago, but frequency had increased considerably recently.  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  He reports his mentation is "close to normal", baseline functional status is fully independent.  Currently on Keppra 543m BID and Decadron 479mQID.  MEDICAL HISTORY:  Past Medical History:  Diagnosis Date   Diabetes mellitus without complication ([facility]   Environmental allergies    Hx: of   GERD (gastroesophageal reflux disease)    Hypertension    Numbness and tingling    Hx; of  Right shoulder    SURGICAL HISTORY: Past Surgical History:  Procedure Laterality Date   ANTERIOR CERVICAL DECOMP/DISCECTOMY FUSION N/A 03/17/2013   Procedure: ANTERIOR CERVICAL DECOMPRESSION/DISCECTOMY FUSION 2 LEVEL C5-7;  Surgeon: DaMelina SchoolsMD;  [place] [facility] Service: Orthopedics;  Laterality: N/A;   COLONOSCOPY     Hx: of   HERNIA REPAIR     TONSILLECTOMY      SOCIAL HISTORY: Social History   Socioeconomic History   Marital status: Married    Spouse name: Not on file   Number of children: Not on file   Years of education: Not on file   Highest education level: Not on file  Occupational History   Not on file  Tobacco Use   Smoking status: Former    Types: Cigars   Smokeless tobacco: Never  Substance and Sexual Activity   Alcohol use: No   Drug use: No   Sexual activity: Not on file  Other Topics Concern   Not on file  Social History Narrative   Not on file   Social Determinants of Health   Financial Resource Strain: Not on file  Food Insecurity: Not on file   Transportation Needs: Not on file  Physical Activity: Not on file  Stress: Not on file  Social Connections: Not on file  Intimate Partner Violence: Not on file    FAMILY HISTORY: Family History  Problem Relation Age of Onset   Diabetes Brother     ALLERGIES:  has No Known Allergies.  MEDICATIONS:  Current Facility-Administered Medications  Medication Dose Route Frequency Provider Last Rate Last Admin   carvedilol (COREG) tablet 12.5 mg  12.5 mg Oral BID WC ZhAngelique BlonderDO   12.5 mg at 05/01/22 0846   dexamethasone (DECADRON) injection 4 mg  4 mg Intravenous Q6H Meyran, KiOcie CornfieldNP   4 mg at 05/01/22 1435   escitalopram (LEXAPRO) tablet 10 mg  10 mg Oral Daily ZhAngelique BlonderDO   10 mg at 05/01/22 0846   insulin aspart (novoLOG) injection 0-20 Units  0-20 Units Subcutaneous Q4H Mapp, Tavien, MD   20 Units at 05/01/22 1433   insulin aspart (novoLOG) injection 0-5 Units  0-5 Units Subcutaneous QHS SrLinus GalasMD       [SDerrill MemoN 05/02/2022] insulin glargine-yfgn (SEMGLEE) injection 20 Units  20 Units Subcutaneous Daily Mapp, Tavien, MD       levETIRAcetam (KEPPRA) tablet 500 mg  500 mg Oral BID Katsadouros, Vasilios, MD   500 mg at 05/01/22 0846   losartan (COZAAR) tablet 100 mg  100 mg Oral Daily ZhAngelique BlonderDO  100 mg at 05/01/22 0846   Oral care mouth rinse  15 mL Mouth Rinse PRN Lucious Groves, DO       pantoprazole (PROTONIX) EC tablet 40 mg  40 mg Oral Daily PRN Angelique Blonder, DO       simvastatin (ZOCOR) tablet 20 mg  20 mg Oral QHS Angelique Blonder, DO   20 mg at 04/30/22 2223    REVIEW OF SYSTEMS:   Constitutional: Denies fevers, chills or abnormal weight loss Eyes: Denies blurriness of vision Ears, nose, mouth, throat, and face: Denies mucositis or sore throat Respiratory: Denies cough, dyspnea or wheezes Cardiovascular: Denies palpitation, chest discomfort or lower extremity swelling Gastrointestinal:  Denies nausea, constipation,  diarrhea GU: Denies dysuria or incontinence Skin: Denies abnormal skin rashes Neurological: Per HPI Musculoskeletal: Denies joint pain, back or neck discomfort. No decrease in ROM Behavioral/Psych: Denies anxiety, disturbance in thought content, and mood instability   PHYSICAL EXAMINATION: Vitals:   05/01/22 0729 05/01/22 1141  BP: (!) 163/90 122/75  Pulse: 88 80  Resp:    Temp:  97.7 F (36.5 C)  SpO2: 97% 96%   KPS: 80. General: Alert, cooperative, pleasant, in no acute distress Head: Normal EENT: No conjunctival injection or scleral icterus. Oral mucosa moist Lungs: Resp effort normal Cardiac: Regular rate and rhythm Abdomen: Soft, non-distended abdomen Skin: No rashes cyanosis or petechiae. Extremities: No clubbing or edema  NEUROLOGIC EXAM: Mental Status: Awake, alert, attentive to examiner. Oriented to self and environment. Language is fluent with intact comprehension.  Noted pyschomotor slowing.  Cranial Nerves: Visual acuity is grossly normal. Visual fields are full. Extra-ocular movements intact. No ptosis. Face is symmetric, tongue midline. Motor: Tone and bulk are normal. Power is full in both arms and legs. Reflexes are symmetric, no pathologic reflexes present. Intact finger to nose bilaterally Sensory: Intact to light touch and temperature Gait: Deferred   LABORATORY DATA:  I have reviewed the data as listed Lab Results  Component Value Date   WBC 10.0 04/29/2022   HGB 16.3 04/29/2022   HGB 15.6 04/29/2022   HCT 48.0 04/29/2022   HCT 44.6 04/29/2022   MCV 89.9 04/29/2022   PLT 191 04/29/2022   Recent Labs    04/29/22 1311 04/30/22 0839 05/01/22 0326  NA 136  136 135 134*  K 3.5  3.5 3.2* 4.0  CL 100  99 100 106  CO2 27 24 22   GLUCOSE 222*  216* 217* 202*  BUN 16  17 14 19   CREATININE 1.16  1.00 1.15 1.28*  CALCIUM 9.3 9.1 8.8*  GFRNONAA >60 >60 58*  PROT 7.3  --   --   ALBUMIN 4.1  --   --   AST 15  --   --   ALT 17  --   --    ALKPHOS 81  --   --   BILITOT 1.1  --   --     RADIOGRAPHIC STUDIES: I have personally reviewed the radiological images as listed and agreed with the findings in the report. EEG adult  Result Date: 04/30/2022 Lora Havens, MD     04/30/2022  4:33 PM Patient Name: Jorge [name] MRN: [id number] Epilepsy Attending: Lora Havens Referring Physician/Provider: Janine Ores, NP Date: 04/30/2022 Duration: 21.06 mins Patient history:  76 y.o. male with a past medical history of diabetes, GERD, HTN, TIA and numbness and tingling of his right shoulder presenting after an episode of confusion and "staring off into space" while at a restaurant  with his wife. EEG to evaluate for seizure. Level of alertness: Awake AEDs during EEG study: LEV Technical aspects: This EEG study was done with scalp electrodes positioned according to the 10-20 International system of electrode placement. Electrical activity was reviewed with band pass filter of 1-70Hz , sensitivity of 7 uV/mm, display speed of 42m/sec with a 60Hz  notched filter applied as appropriate. EEG data were recorded continuously and digitally stored.  Video monitoring was available and reviewed as appropriate. Description: No clear posterior dominant rhythm was seen. EEG showed continuous generalized 3 to 6 Hz theta-delta slowing. Hyperventilation and photic stimulation were not performed.   Of note, study was technically difficult due to significant myogenic and electrode artifact. ABNORMALITY - Continuous slow, generalized IMPRESSION: This technically difficult study is suggestive of moderate diffuse encephalopathy, nonspecific etiology. No seizures or epileptiform discharges were seen throughout the recording. A normal interictal EEG does not exclude nor support the diagnosis of epilepsy. If suspicion for interictal activity remains a concern, a prolonged study can be considered. PLora Havens  MR BRAIN W WO CONTRAST  Result Date: 04/29/2022 CLINICAL  DATA:  Altered mental status EXAM: MRI HEAD WITHOUT AND WITH CONTRAST TECHNIQUE: Multiplanar, multiecho pulse sequences of the brain and surrounding structures were obtained without and with intravenous contrast. CONTRAST:  917mGADAVIST GADOBUTROL 1 MMOL/ML IV SOLN COMPARISON:  11/19/2017 FINDINGS: Brain: There is masslike hyperintense T2-weighted signal involving the left-greater-than-right insula and temporal lobes, crossing the midline at the anterior commissure. There is no contrast enhancement. There is mild mass effect on the left lateral ventricle. No abnormal diffusion restriction. No hemorrhage. Vascular: Normal flow voids Skull and upper cervical spine: Normal marrow signal. Sinuses/Orbits: Chronic right maxillary sinusitis.  Normal orbits. Other: None IMPRESSION: 1. Masslike hyperintense T2-weighted signal involving the left-greater-than-right insula and temporal lobes. 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 Electronically Signed   By: KeUlyses Jarred.D.   On: 04/29/2022 20:04   CT HEAD WO CONTRAST  Result Date: 04/29/2022 CLINICAL DATA:  Acute neuro deficit. Aphasia. Severe hypertension today. EXAM: CT HEAD WITHOUT CONTRAST TECHNIQUE: Contiguous axial images were obtained from the base of the skull through the vertex without intravenous contrast. RADIATION DOSE REDUCTION: This exam was performed according to the departmental dose-optimization program which includes automated exposure control, adjustment of the mA and/or kV according to patient size and/or use of iterative reconstruction technique. COMPARISON:  CT head 11/24/2017.  MRI head 11/19/2017 FINDINGS: Brain: Extensive low-density edema is present bilaterally, left greater than right. This is centered in the left insula but extends into the left basal ganglia and left frontal and temporal lobe. This shows significant progression compared with 2019 MRI. There is also edema in the deep white matter tracks on the right  extending into the right insula. Ventricle size normal. Mild flattening of the left lateral ventricle due to edema. No acute hemorrhage. Vascular: Middle cerebral arteries shows relative hyperdensity diffusely and bilaterally. This is likely due to surrounding brain edema. Skull: Negative Sinuses/Orbits: Complete opacification right maxillary sinus. Mucosal edema right frontal and ethmoid sinus. Bilateral cataract extraction Other: None IMPRESSION: Extensive low-density edema centered in the insula bilaterally, left greater than right. This was present on MRI in 2019 but shows significant progression. There is local mass-effect flattening left lateral ventricle. No acute hemorrhage. There is relative hyperdensity of the MCA bilaterally which is likely accentuated weighted due to surrounding edema in the brain rather than arterial thrombosis. These nonspecific features could be seen with encephalitis.  Consider herpes. Tumor is a consideration however the edema is not particularly masslike and was present 4 years ago although with significant progression. Also consider PRES given the history of severe hypertension today These results were called by telephone at the time of interpretation on 04/29/2022 at 2:28 pm to provider Nechama Guard, who verbally acknowledged these results. Electronically Signed   By: Franchot Gallo M.D.   On: 04/29/2022 14:30    ASSESSMENT & PLAN:  Bitemporal CNS Neoplasm Focal seizures  Jorge [name] presents with clinical and radiographic syndrome localizing to temporal lobes and insular cortices.  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  Prior MRI from 2019 demonstrated the tumor in less mature form, pattern of infiltration over 4 years may suggest intermediate grade of lack of IDH mutation.  We discussed and recommended stereotactic biopsy with Dr. Marcello Moores.  He is agreeable with this.    Treatment may consist of some combination of radiation, chemotherapy, targeted therapy if IDH  mutant.  We will discuss his case in detail in brain/spine tumor board next week.  Decadron may be decreased to 61m daily given concurrent DM2.  Bulk of T2 signal represents tumor infiltration rather than edema.  Ok with Keppra 5031mBID.  All questions were answered. The patient knows to call the clinic with any problems, questions or concerns.  We will con't to follow peripherally.  The total time spent in the encounter was  60 minutes  and more than 50% was on counseling and review of test results     ZaVentura SellersMD 05/01/2022 3:42 PM

## 2022-05-02 ENCOUNTER — Other Ambulatory Visit: Payer: Self-pay | Admitting: Neurosurgery

## 2022-05-02 DIAGNOSIS — E878 Other disorders of electrolyte and fluid balance, not elsewhere classified: Secondary | ICD-10-CM

## 2022-05-02 DIAGNOSIS — G9349 Other encephalopathy: Secondary | ICD-10-CM | POA: Diagnosis not present

## 2022-05-02 DIAGNOSIS — I129 Hypertensive chronic kidney disease with stage 1 through stage 4 chronic kidney disease, or unspecified chronic kidney disease: Secondary | ICD-10-CM | POA: Diagnosis not present

## 2022-05-02 DIAGNOSIS — N1832 Chronic kidney disease, stage 3b: Secondary | ICD-10-CM | POA: Diagnosis not present

## 2022-05-02 DIAGNOSIS — E1122 Type 2 diabetes mellitus with diabetic chronic kidney disease: Secondary | ICD-10-CM | POA: Diagnosis not present

## 2022-05-02 LAB — BASIC METABOLIC PANEL
Anion gap: 6 (ref 5–15)
BUN: 30 mg/dL — ABNORMAL HIGH (ref 8–23)
CO2: 21 mmol/L — ABNORMAL LOW (ref 22–32)
Calcium: 9 mg/dL (ref 8.9–10.3)
Chloride: 109 mmol/L (ref 98–111)
Creatinine, Ser: 1.41 mg/dL — ABNORMAL HIGH (ref 0.61–1.24)
GFR, Estimated: 52 mL/min — ABNORMAL LOW (ref >60)
Glucose, Bld: 155 mg/dL — ABNORMAL HIGH (ref 70–99)
Potassium: 4 mmol/L (ref 3.5–5.1)
Sodium: 136 mmol/L (ref 135–145)

## 2022-05-02 LAB — GLUCOSE, CAPILLARY
Glucose-Capillary: 149 mg/dL — ABNORMAL HIGH (ref 70–99)
Glucose-Capillary: 179 mg/dL — ABNORMAL HIGH (ref 70–99)
Glucose-Capillary: 225 mg/dL — ABNORMAL HIGH (ref 70–99)
Glucose-Capillary: 316 mg/dL — ABNORMAL HIGH (ref 70–99)
Glucose-Capillary: 266 mg/dL — ABNORMAL HIGH (ref 70–99)

## 2022-05-02 MED ORDER — LACTATED RINGERS IV BOLUS: 1000.0000 mL | Freq: Once | INTRAVENOUS | Status: DC

## 2022-05-02 MED ORDER — LACTATED RINGERS IV BOLUS
1000.0000 mL | Freq: Once | INTRAVENOUS | Status: AC
  Administered 2022-05-02: 1000 mL via INTRAVENOUS

## 2022-05-02 MED ORDER — INSULIN ASPART 100 UNIT/ML IJ SOLN
6.0000 [IU] | Freq: Three times a day (TID) | INTRAMUSCULAR | Status: DC
  Administered 2022-05-02: 6 [IU] via SUBCUTANEOUS

## 2022-05-02 MED ORDER — INSULIN ASPART 100 UNIT/ML IJ SOLN
0.0000 [IU] | Freq: Three times a day (TID) | INTRAMUSCULAR | Status: DC
  Administered 2022-05-02: 15 [IU] via SUBCUTANEOUS
  Administered 2022-05-02: 7 [IU] via SUBCUTANEOUS
  Administered 2022-05-03: 4 [IU] via SUBCUTANEOUS
  Administered 2022-05-03: 20 [IU] via SUBCUTANEOUS
  Administered 2022-05-03: 7 [IU] via SUBCUTANEOUS
  Administered 2022-05-04: 4 [IU] via SUBCUTANEOUS
  Administered 2022-05-04: 7 [IU] via SUBCUTANEOUS

## 2022-05-02 MED ORDER — INSULIN ASPART 100 UNIT/ML IJ SOLN
4.0000 [IU] | Freq: Three times a day (TID) | INTRAMUSCULAR | Status: DC
  Administered 2022-05-02: 4 [IU] via SUBCUTANEOUS

## 2022-05-02 MED ORDER — DEXAMETHASONE SODIUM PHOSPHATE 4 MG/ML IJ SOLN
4.0000 mg | INTRAMUSCULAR | Status: DC
Start: 2022-05-02 — End: 2022-05-04
  Administered 2022-05-02 – 2022-05-04 (×3): 4 mg via INTRAVENOUS
  Filled 2022-05-02 (×3): qty 1

## 2022-05-02 MED ORDER — INSULIN ASPART 100 UNIT/ML IJ SOLN
8.0000 [IU] | Freq: Three times a day (TID) | INTRAMUSCULAR | Status: DC
  Administered 2022-05-03 – 2022-05-04 (×4): 8 [IU] via SUBCUTANEOUS

## 2022-05-02 MED ORDER — DEXAMETHASONE SODIUM PHOSPHATE 4 MG/ML IJ SOLN
4.0000 mg | INTRAMUSCULAR | Status: DC
Start: 2022-05-03 — End: 2022-05-02

## 2022-05-02 NOTE — Progress Notes (Signed)
Subjective: Patient reports sitting up in bed eating breakfast. He currently has no complaints. Wife at bedside reporting he is at his baseline mental status. NAE ON.   Objective: Vital signs in last 24 hours: Temp:  [97.7 F (36.5 C)-98.4 F (36.9 C)] 98.4 F (36.9 C) (08/25 0341) Pulse Rate:  [67-83] 77 (08/25 0736) Resp:  [18-20] 18 (08/25 0736) BP: (113-134)/(57-86) 134/86 (08/25 0736) SpO2:  [94 %-97 %] 97 % (08/25 0736)  Intake/Output from previous day: 08/24 0701 - 08/25 0700 In: 52.9 [I.V.:52.9] Out: -  Intake/Output this shift: No intake/output data recorded.  Physical Exam: Patient is awake, A/O X 3, disoriented to situation. He is appropriately conversant, and in good spirits. Eyes open spontaneously. They are in NAD and VSS. Doing well. Speech is fluent and appropriate. MAEW with good strength that is symmetric bilaterally.  BUE 5/5 throughout, BLE 5/5 throughout. Sensation to light touch is intact. PERLA, EOMI. CNs grossly intact.   Lab Results: Recent Labs    04/29/22 1311  WBC 10.0  HGB 15.6  16.3  HCT 44.6  48.0  PLT 191   BMET Recent Labs    05/01/22 0326 05/02/22 0404  NA 134* 136  K 4.0 4.0  CL 106 109  CO2 22 21*  GLUCOSE 202* 155*  BUN 19 30*  CREATININE 1.28* 1.41*  CALCIUM 8.8* 9.0    Studies/Results: MR BRAIN W WO CONTRAST  Result Date: 05/01/2022 CLINICAL DATA:  Brain/CNS neoplasm, staging. EXAM: MRI HEAD WITHOUT AND WITH CONTRAST TECHNIQUE: Multiplanar, multiecho pulse sequences of the brain and surrounding structures were obtained without and with intravenous contrast. CONTRAST:  9.45m GADAVIST GADOBUTROL 1 MMOL/ML IV SOLN COMPARISON:  04/29/2022 FINDINGS: Nonenhancing, masslike hyperintense T2-weighted signal in the left greater than right insula and temporal lobes is unchanged. Thin-section imaging was obtained as part of preoperative protocol. IMPRESSION: Scan for preoperative planning showing unchanged appearance of suspected diffuse  astrocytoma. Electronically Signed   By: KUlyses JarredM.D.   On: 05/01/2022 20:30   EEG adult  Result Date: 04/30/2022 YLora Havens MD     04/30/2022  4:33 PM Patient Name: TDameir GentzlerMRN: [id number] Attending: PLora HavensReferring Physician/Provider: SJanine Ores NP Date: 04/30/2022 Duration: 21.06 mins Patient history:  76y.o. male with a past medical history of diabetes, GERD, HTN, TIA and numbness and tingling of his right shoulder presenting after an episode of confusion and "staring off into space" while at a restaurant with his wife. EEG to evaluate for seizure. Level of alertness: Awake AEDs during EEG study: LEV Technical aspects: This EEG study was done with scalp electrodes positioned according to the 10-20 International system of electrode placement. Electrical activity was reviewed with band pass filter of 1-'70Hz'$ , sensitivity of 7 uV/mm, display speed of 343msec with a '60Hz'$  notched filter applied as appropriate. EEG data were recorded continuously and digitally stored.  Video monitoring was available and reviewed as appropriate. Description: No clear posterior dominant rhythm was seen. EEG showed continuous generalized 3 to 6 Hz theta-delta slowing. Hyperventilation and photic stimulation were not performed.   Of note, study was technically difficult due to significant myogenic and electrode artifact. ABNORMALITY - Continuous slow, generalized IMPRESSION: This technically difficult study is suggestive of moderate diffuse encephalopathy, nonspecific etiology. No seizures or epileptiform discharges were seen throughout the recording. A normal interictal EEG does not exclude nor support the diagnosis of epilepsy. If suspicion for interictal activity remains a concern, a prolonged study can be considered. Priyanka  Barbra Sarks    Assessment/Plan: 77 year old male presented to the ED due to c/o intermittent episodes of "blank stares" that have been ongoing for the last couple of  weeks. The patient's wife notes mild confusion and lethargy over the last 6 months. His neurological exam has significantly improved since starting Decadron. MRI reviewed and revealed a diffuse infiltrating CNS process that is concerning for glioma or other neoplasm. Plan for patient to undergo stereotactic brain biopsy with Dr. Marcello Moores on 05/07/2022. MRI brain with stealth protocol completed yesterday in preparation for biopsy. Since the patient's mental status is nearly back to his baseline, it would be acceptable for patient to be discharged home and return for his biopsy on Wednesday. The patient's wife is amenable to this plan.   -Hold Plavix -Neuro oncology consult  -Keppra 500 mg BID -Decadron 4 mg QD -Stereotactic brain biopsy with Dr. Marcello Moores scheduled for 05/07/2022   LOS: 3 days     Marvis Moeller, DNP, AGNP-C Neurosurgery Nurse Practitioner  [facility] [address], [place], [place] [address] P: [phone]    F: [phone]  05/02/2022 8:45 AM

## 2022-05-02 NOTE — Care Management Important Message (Signed)
Important Message  Patient Details  Name: Jorge [name] MRN: [id number] Date of Birth: 11/29/1945   Medicare Important Message Given:  Yes     Calianna Kim Montine Circle 05/02/2022, 2:49 PM

## 2022-05-02 NOTE — Progress Notes (Signed)
HD#3 Subjective:   Summary: [name] is a 76 year old male with PMH of TIA in 2019, HTN, T2DM, CKD 3 that presents for confusion and weakness.  Overnight Events: none  Jorge [name] is alert and oriented x 3 today. He reports sleeping well last night. No new concerns and feels well. Wife at bedside.   Objective:  Vital signs in last 24 hours: Vitals:   05/01/22 2010 05/01/22 2357 05/02/22 0341 05/02/22 0736  BP: 123/80 124/72 (!) 113/57 134/86  Pulse: 83 80 67 77  Resp: '18 20 20 18  '$ Temp: 98.2 F (36.8 C) 98.2 F (36.8 C) 98.4 F (36.9 C)   TempSrc: Oral Oral Oral   SpO2: 96% 97% 94% 97%  Weight:      Height:       Supplemental O2: Room Air SpO2: 97 %   Physical Exam:  Constitutional: alert, laying in bed, in no acute distress HENT: normocephalic atraumatic, mucous membranes dry Neck: supple Cardiovascular: regular rate and rhythm Pulmonary/Chest: normal work of breathing on room air MSK: normal bulk and tone Neurological: alert & oriented x 3 Skin: warm and dry Psych: pleasant, normal mood and behavior  Filed Weights   04/29/22 1159  Weight: 90.7 kg    No intake or output data in the 24 hours ending 05/02/22 0800  Net IO Since Admission: -1,147.06 mL [05/02/22 0800]  Pertinent Labs:    Latest Ref Rng & Units 04/29/2022    1:11 PM 11/18/2017   11:58 PM 03/16/2013    3:25 PM  CBC  WBC 4.0 - 10.5 K/uL 10.0  8.0  9.6   Hemoglobin 13.0 - 17.0 g/dL 13.0 - 17.0 g/dL 16.3    15.6  13.2  15.3   Hematocrit 39.0 - 52.0 % 39.0 - 52.0 % 48.0    44.6  38.7  43.0   Platelets 150 - 400 K/uL 191  181  185        Latest Ref Rng & Units 05/02/2022    4:04 AM 05/01/2022    3:26 AM 04/30/2022    8:39 AM  CMP  Glucose 70 - 99 mg/dL 155  202  217   BUN 8 - 23 mg/dL '30  19  14   '$ Creatinine 0.61 - 1.24 mg/dL 1.41  1.28  1.15   Sodium 135 - 145 mmol/L 136  134  135   Potassium 3.5 - 5.1 mmol/L 4.0  4.0  3.2   Chloride 98 - 111 mmol/L 109  106  100   CO2 22 - 32  mmol/L '21  22  24   '$ Calcium 8.9 - 10.3 mg/dL 9.0  8.8  9.1     Imaging: MR BRAIN W WO CONTRAST  Result Date: 05/01/2022 CLINICAL DATA:  Brain/CNS neoplasm, staging. EXAM: MRI HEAD WITHOUT AND WITH CONTRAST TECHNIQUE: Multiplanar, multiecho pulse sequences of the brain and surrounding structures were obtained without and with intravenous contrast. CONTRAST:  9.25m GADAVIST GADOBUTROL 1 MMOL/ML IV SOLN COMPARISON:  04/29/2022 FINDINGS: Nonenhancing, masslike hyperintense T2-weighted signal in the left greater than right insula and temporal lobes is unchanged. Thin-section imaging was obtained as part of preoperative protocol. 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 Electronically Signed   By: KUlyses JarredM.D.   On: 05/01/2022 20:30    Assessment/Plan:   Principal Problem:   Encephalopathy Active Problems:   Essential hypertension   Type 2 diabetes mellitus with stage 3b chronic kidney disease,  without long-term current use of insulin ([place])   Stage 3b chronic kidney disease ([place])   Brain mass   Patient Summary: Jorge [name] is a 76 y.o. with a pertinent PMH of TIA in 2019 on plavix, HTN, T2DM, CKD 3 who presented with confusion and weakness and admitted for encephalopathy secondary to newly diagnosed astrocytoma.   Low grade diffuse astrocytoma Acute Encephalopathy Hx of TIA in 2019 MRI showed masslike hyperintensity of left-greater-than-right insula and temporal lobes consistent with low grade diffuse astrocytoma. RPR and HIV negative. EEG showed no seizure activity. Will continue renally dosed Keppra. Neurosurgery plans for biopsy on 8/30. Neuro-oncology consulted. [facility] neurology, neurosurgery and neuro-oncology assistance with his care.  -Keppra 500 mg BID -Dexamethasone 4 mg daily  -continue holding Plavix  HTN BP has been <140/90. He is on his home losartan and carvedilol.  -Losartan 100 mg daily  -Carvedilol  12.5 mg BID  T2DM uncontrolled with hyperglycemia A1c 7.6. Started on dexamethasone. Increased semglee and SSI every 4 hours. CBGs responded well <180 overnight.  -semglee 20 units -SSI (resistant) TID ACHS -SSI QHS  CKD3b Creatinine increasing 1.41. Gave 1L LR bolus today.  -repeat BMP  Electrolytes Abnormalities Hypokalemia Potassium 4. Magnesium 2.1 Will continue to monitor, replete if necessary.   Diet: Carb-Modified IVF: None,None VTE: SCDs Code: Full Family Update: wife at bedside  Dispo: plan for biopsy next Wednesday, continue hold plavix, anticipate discharge to Home in 1-2 days pending renal function and diabetes management.   Angelique Blonder, DO Internal Medicine Resident PGY-1 Please contact the on call pager after 5 pm and on weekends at [phone].

## 2022-05-03 ENCOUNTER — Inpatient Hospital Stay (HOSPITAL_COMMUNITY): Payer: Medicare Other

## 2022-05-03 LAB — URINALYSIS, COMPLETE (UACMP) WITH MICROSCOPIC
Bacteria, UA: NONE SEEN
Bilirubin Urine: NEGATIVE
Glucose, UA: 50 mg/dL — AB
Hgb urine dipstick: NEGATIVE
Ketones, ur: NEGATIVE mg/dL
Leukocytes,Ua: NEGATIVE
Nitrite: NEGATIVE
Protein, ur: NEGATIVE mg/dL
Specific Gravity, Urine: 1.018 (ref 1.005–1.030)
pH: 5 (ref 5.0–8.0)

## 2022-05-03 LAB — BASIC METABOLIC PANEL
Anion gap: 7 (ref 5–15)
BUN: 31 mg/dL — ABNORMAL HIGH (ref 8–23)
CO2: 25 mmol/L (ref 22–32)
Calcium: 9 mg/dL (ref 8.9–10.3)
Chloride: 105 mmol/L (ref 98–111)
Creatinine, Ser: 1.44 mg/dL — ABNORMAL HIGH (ref 0.61–1.24)
GFR, Estimated: 50 mL/min — ABNORMAL LOW (ref >60)
Glucose, Bld: 204 mg/dL — ABNORMAL HIGH (ref 70–99)
Potassium: 4.2 mmol/L (ref 3.5–5.1)
Sodium: 137 mmol/L (ref 135–145)

## 2022-05-03 LAB — CREATININE, SERUM
Creatinine, Ser: 1.47 mg/dL — ABNORMAL HIGH (ref 0.61–1.24)
GFR, Estimated: 49 mL/min — ABNORMAL LOW (ref >60)

## 2022-05-03 LAB — GLUCOSE, CAPILLARY
Glucose-Capillary: 194 mg/dL — ABNORMAL HIGH (ref 70–99)
Glucose-Capillary: 221 mg/dL — ABNORMAL HIGH (ref 70–99)
Glucose-Capillary: 360 mg/dL — ABNORMAL HIGH (ref 70–99)
Glucose-Capillary: 287 mg/dL — ABNORMAL HIGH (ref 70–99)

## 2022-05-03 MED ORDER — LACTATED RINGERS IV BOLUS
1000.0000 mL | Freq: Once | INTRAVENOUS | Status: AC
  Administered 2022-05-03: 1000 mL via INTRAVENOUS

## 2022-05-03 NOTE — Progress Notes (Addendum)
HD#4 Subjective:   Summary: [name] is a 76 year old male with PMH of TIA in 2019, HTN, T2DM, CKD 3 who presented with confusion and weakness and admitted for encephalopathy secondary to newly diagnosed astrocytoma.   Overnight Events: none  Jorge [name] continues to be alert and oriented. Wife believes he is close to baseline before the mental status changes the past 1-2 weeks. She reports his memory has improved. He denies any new concerns and feels well. No trouble with voiding.   Objective:  Vital signs in last 24 hours: Vitals:   05/02/22 2041 05/03/22 0000 05/03/22 0413 05/03/22 0847  BP: 117/74 137/68 (!) 162/82 (!) 184/78  Pulse: 69 68 (!) 53 (!) 56  Resp: '18 18 18 17  '$ Temp: 98.2 F (36.8 C) 98.9 F (37.2 C) 97.7 F (36.5 C) 97.7 F (36.5 C)  TempSrc: Oral Oral Oral Oral  SpO2: 97% 99% 99% 100%  Weight:      Height:       Supplemental O2: Room Air SpO2: 100 %   Physical Exam:  Constitutional: alert, laying in bed, in no acute distress HENT: normocephalic atraumatic Neck: supple Cardiovascular: bradycardia, normal rhythm Pulmonary/Chest: normal work of breathing on room air, clear to ausculation bilaterally MSK: normal bulk and tone Neurological: alert & oriented x 3 Skin: warm and dry Psych: pleasant, normal mood and behavior  Filed Weights   04/29/22 1159  Weight: 90.7 kg     Intake/Output Summary (Last 24 hours) at 05/03/2022 1130 Last data filed at 05/03/2022 1028 Gross per 24 hour  Intake 240 ml  Output 600 ml  Net -360 ml    Net IO Since Admission: -1,507.06 mL [05/03/22 1130]  Pertinent Labs:    Latest Ref Rng & Units 04/29/2022    1:11 PM 11/18/2017   11:58 PM 03/16/2013    3:25 PM  CBC  WBC 4.0 - 10.5 K/uL 10.0  8.0  9.6   Hemoglobin 13.0 - 17.0 g/dL 13.0 - 17.0 g/dL 16.3    15.6  13.2  15.3   Hematocrit 39.0 - 52.0 % 39.0 - 52.0 % 48.0    44.6  38.7  43.0   Platelets 150 - 400 K/uL 191  181  185        Latest Ref Rng &  Units 05/03/2022    3:55 AM 05/02/2022    4:04 AM 05/01/2022    3:26 AM  CMP  Glucose 70 - 99 mg/dL 204  155  202   BUN 8 - 23 mg/dL '31  30  19   '$ Creatinine 0.61 - 1.24 mg/dL 1.44  1.41  1.28   Sodium 135 - 145 mmol/L 137  136  134   Potassium 3.5 - 5.1 mmol/L 4.2  4.0  4.0   Chloride 98 - 111 mmol/L 105  109  106   CO2 22 - 32 mmol/L '25  21  22   '$ Calcium 8.9 - 10.3 mg/dL 9.0  9.0  8.8     Imaging: No results found.  Assessment/Plan:   Principal Problem:   Encephalopathy Active Problems:   Essential hypertension   Type 2 diabetes mellitus with stage 3b chronic kidney disease, without long-term current use of insulin ([facility])   Stage 3b chronic kidney disease ([place])   Brain mass   Patient Summary: Jorge [name] is a 76 y.o. with a pertinent PMH of TIA in 2019 on plavix, HTN, T2DM, CKD 3 who presented with confusion and weakness  and admitted for encephalopathy secondary to newly diagnosed astrocytoma.   Low grade diffuse astrocytoma Acute Encephalopathy Hx of TIA in 2019 MRI showed masslike hyperintensity of left-greater-than-right insula and temporal lobes consistent with low grade diffuse astrocytoma. EEG showed no seizure activity. Neurosurgery plans for biopsy on 8/30. Neuro-oncology consulted. On renally dosed Keppra and dexamethasone daily with improved mentation. His creatinine remains elevated even with 1L LR bolus yesterday. May need to adjust Keppra if causing AKI.  -appreciate neurology, neurosurgery and neuro-oncology assistance with his care -renally dosed Keppra 500 mg BID -Dexamethasone 4 mg daily  -continue holding Plavix  HTN BP has been <140/90 the past few days. Elevated today. He is on home losartan and carvedilol. HR was in mid-50s overnight so will hold carvedilol and reassess.  -Losartan 100 mg daily  -hold Carvedilol 12.5 mg BID today, reassess and consider restart tomorrow  T2DM uncontrolled with hyperglycemia A1c 7.6. On daily dexamethasone. On semglee  and SSI. CBGs 190-200 overnight.  -semglee 20 units -novolog 8 units TID ACHS -SSI (resistant) TID ACHS  AKI on CKD3b Creatinine still elevated 1.44. Gave 1L LR bolus yesterday. UA negative for casts or signs of interstitial nephritis.  -give another 1L LR bolus today -repeat serum creatinine   Hypokalemia Hypomagnesemia  Potassium steady 4.2. Magnesium 2.1 Will continue to monitor, replete if necessary.   Diet: Carb-Modified IVF: None,None VTE: SCDs Code: Full Family Update: wife at bedside  Dispo: plan for biopsy next Wednesday, continue hold plavix, anticipate discharge to Home in 1-2 days pending renal function and diabetes management.   Angelique Blonder, DO Internal Medicine Resident PGY-1 Please contact the on call pager after 5 pm and on weekends at [phone].

## 2022-05-04 ENCOUNTER — Other Ambulatory Visit: Payer: Self-pay | Admitting: Student

## 2022-05-04 LAB — CULTURE, BLOOD (ROUTINE X 2)
Culture: NO GROWTH
Culture: NO GROWTH

## 2022-05-04 LAB — GLUCOSE, CAPILLARY
Glucose-Capillary: 177 mg/dL — ABNORMAL HIGH (ref 70–99)
Glucose-Capillary: 183 mg/dL — ABNORMAL HIGH (ref 70–99)
Glucose-Capillary: 242 mg/dL — ABNORMAL HIGH (ref 70–99)

## 2022-05-04 LAB — BASIC METABOLIC PANEL
Anion gap: 4 — ABNORMAL LOW (ref 5–15)
BUN: 26 mg/dL — ABNORMAL HIGH (ref 8–23)
CO2: 28 mmol/L (ref 22–32)
Calcium: 8.8 mg/dL — ABNORMAL LOW (ref 8.9–10.3)
Chloride: 103 mmol/L (ref 98–111)
Creatinine, Ser: 1.26 mg/dL — ABNORMAL HIGH (ref 0.61–1.24)
GFR, Estimated: 59 mL/min — ABNORMAL LOW (ref >60)
Glucose, Bld: 182 mg/dL — ABNORMAL HIGH (ref 70–99)
Potassium: 4 mmol/L (ref 3.5–5.1)
Sodium: 135 mmol/L (ref 135–145)

## 2022-05-04 MED ORDER — LEVETIRACETAM 500 MG PO TABS: 500.0000 mg | ORAL_TABLET | Freq: Two times a day (BID) | ORAL | 0 refills | Status: DC

## 2022-05-04 MED ORDER — INSULIN GLARGINE 100 UNIT/ML SOLOSTAR PEN: 25.0000 [IU] | PEN_INJECTOR | Freq: Every day | SUBCUTANEOUS | 1 refills | Status: AC

## 2022-05-04 MED ORDER — DEXAMETHASONE 4 MG PO TABS: 4.0000 mg | ORAL_TABLET | Freq: Every day | ORAL | 0 refills | Status: DC

## 2022-05-04 MED ORDER — PEN NEEDLES 32G X 4 MM MISC
1.0000 | Freq: Every day | 2 refills | Status: AC
Start: 2022-05-04 — End: 2022-06-03

## 2022-05-04 MED ORDER — AMLODIPINE BESYLATE 5 MG PO TABS: 5.0000 mg | ORAL_TABLET | Freq: Every day | ORAL | 0 refills | Status: DC

## 2022-05-04 MED ORDER — BLOOD GLUCOSE MONITOR KIT: PACK | 0 refills | Status: DC

## 2022-05-04 NOTE — Plan of Care (Signed)
  Problem: Education: Goal: Knowledge of General Education information will improve Description: Including pain rating scale, medication(s)/side effects and non-pharmacologic comfort measures Outcome: Adequate for Discharge   

## 2022-05-04 NOTE — Discharge Instructions (Addendum)
You were hospitalized for your confusion and staring spells. You were diagnosed with tumor in your brain which was evaluated by neurology, neurosurgery and neuro-oncology. Neurosurgery plans for biopsy on Wednesday 8/30. Thank you for allowing [place] to be part of your care.   We arranged for [facility] after your biopsy (Contact #: [phone])  Please note these changes made to your medications:   Please START taking:  -Keppra 500 mg twice daily -Dexamethasone 4 mg daily -Amlodipine 5 mg daily -Lantus 25 units daily   Please STOP taking:  -stop Plavix for biopsy, you will need follow up afterwards about restarting it.  -stop Carvedilol until you have follow up for blood pressure, we switched you to amlodipine.  -stop Glipizide   Please make sure to check your blood sugar with the glucometer. You will need follow up with repeat labs and discuss any changes for your medications.   Please call our clinic if you have any questions or concerns, we may be able to help and keep you from a long and expensive emergency room wait. Our clinic and after hours phone number is [phone], the best time to call is Monday through Friday 9 am to 4 pm but there is always someone available 24/7 if you have an emergency. If you need medication refills please notify your pharmacy one week in advance and they will send [place] a request.

## 2022-05-04 NOTE — Discharge Summary (Signed)
Name: Jorge [name] MRN: [id number] DOB: 22-Oct-1945 76 y.o. PCP: Jorge Fritter, FNP  Date of Admission: 04/29/2022 11:53 AM Date of Discharge: 05/04/2022 Attending Physician: Dr. Jimmye Norman  Discharge Diagnosis: Principal Problem:   Encephalopathy Active Problems:   Essential hypertension   Type 2 diabetes mellitus with stage 3b chronic kidney disease, without long-term current use of insulin ([facility])   Stage 3b chronic kidney disease ([place])   Brain mass    Discharge Medications: Allergies as of 05/04/2022   No Known Allergies      Medication List     STOP taking these medications    carvedilol 12.5 MG tablet Commonly known as: COREG   clopidogrel 75 MG tablet Commonly known as: PLAVIX   diphenhydramine-acetaminophen 25-500 MG Tabs tablet Commonly known as: TYLENOL PM   glipiZIDE 10 MG 24 hr tablet Commonly known as: GLUCOTROL XL       TAKE these medications    amLODipine 5 MG tablet Commonly known as: NORVASC Take 1 tablet (5 mg total) by mouth daily.   blood glucose meter kit and supplies Kit Dispense based on patient and insurance preference. Use up to four times daily as directed.   busPIRone 5 MG tablet Commonly known as: BUSPAR Take 5 mg by mouth 3 (three) times daily as needed (anxiety).   dexamethasone 4 MG tablet Commonly known as: DECADRON Take 1 tablet (4 mg total) by mouth daily.   escitalopram 10 MG tablet Commonly known as: LEXAPRO Take 10 mg by mouth daily.   insulin glargine 100 UNIT/ML Solostar Pen Commonly known as: LANTUS Inject 25 Units into the skin daily.   levETIRAcetam 500 MG tablet Commonly known as: KEPPRA Take 1 tablet (500 mg total) by mouth 2 (two) times daily.   losartan 100 MG tablet Commonly known as: COZAAR Take 100 mg by mouth daily.   metFORMIN 500 MG 24 hr tablet Commonly known as: GLUCOPHAGE-XR Take 250 mg by mouth 2 (two) times daily with a meal.   naproxen sodium 220 MG tablet Commonly known as:  ALEVE Take 220 mg by mouth daily as needed (pain).   pantoprazole 40 MG tablet Commonly known as: PROTONIX Take 40 mg by mouth daily as needed (acid reflux).   Pen Needles 32G X 4 MM Misc 1 Needle by Does not apply route daily.   sildenafil 100 MG tablet Commonly known as: VIAGRA Take 100 mg by mouth daily as needed for erectile dysfunction.   simvastatin 20 MG tablet Commonly known as: ZOCOR Take 2 tablets (40 mg total) by mouth at bedtime. What changed: how much to take   ZYRTEC PO Take 10 mg by mouth daily as needed (allergies).        Disposition and follow-up:   [name] was discharged from [facility] in Good condition.  At the hospital follow up visit please address:  1.  Follow-up:  a. Astrocytoma: taking Keppra and Decadron    b. T2DM: checking glucose at home, taking lantus daily   c. AKI and electrolyte deficiency: repeat BMP   d. HTN: recheck BP and HR, decide on HTN regimen   2.  Labs / imaging needed at time of follow-up: BMP  3.  Pending labs/ test needing follow-up: none  4.  Medication Changes  START: -Keppra 500 mg twice daily  -Decadron 4 mg daily  -Amlodipine 5 mg daily  -Lantus 25 units daily   HOLD: Carvedilol, Plavix  STOP: Glipizide  Follow-up Appointments: -Neurosurgery biopsy on 8/30 -  [facility] hospital follow up in 2 weeks   Hospital Course by problem list: Low grade diffuse astrocytoma Acute Encephalopathy Hx of TIA in 2019 Patient presented with staring spells for the past few months with increased frequency the week prior to admission. The staring lasts for a few minutes and he is unresponsive. He would then return to baseline right after. Family noted change in mental status and increased confusion. History of TIA in 2019 which family believes he had some staring episodes as well. MRI showed masslike hyperintensity of left-greater-than-right insula and temporal lobes consistent with low grade diffuse  astrocytoma. Neurology evaluated him and started him on renally dosed Keppra. EEG showed no seizure activity. Neurosurgery plans for biopsy on 8/30. Neuro-oncology consulted and will follow. He was also started on dexamethasone daily. His mentation improved with steroid likely addressing the cerebral edema. Family notes he is close to baseline prior to the events leading to admission. On day of discharge, he was alert and oriented x 3 with no focal neuro deficits.   HTN Patient presented with hypertensive emergency BP >210/120. Was started on nicardipine drip in ED with good response. He was restarted on losartan and carvedilol. BP improved over course. He was noted to have bradycardia overnight and carvedilol was held. Plan to stop carvedilol and start amlodipine at discharge with outpatient follow up. At that visit, can decide if continue amlodipine or restart carvedilol.    T2DM uncontrolled with hyperglycemia A1c 7.6. He was started on daily dexamethasone. Required long acting and meal-time insulin. Improved glycemic control by the time of discharge. He will need to go home with long acting and continue his metformin. Discussed stopping his glipizide. Family is comfortable assisting with insulin injections since they have family with T1DM and aware of how to use insulin.    AKI on CKD3b Creatinine increased during hospital course. Likely setting of prior poor po intake. Gave IV fluids and he had improved po intake. No issue with voiding and had good urine output. On day of discharge, creatinine trending down.    Hypokalemia Hypomagnesemia  Potassium dropped to 3.2, magnesium 1.6. Replenished both during hospital course and repeat showed normal levels.     Discharge Subjective: Patient reports feeling well and ready to go home. Denies any new concerns today. Wife notes his mentation is almost near baseline.   Discharge Exam:   BP (!) 170/90   Pulse 72   Temp 98.3 F (36.8 C) (Oral)   Resp  18   Ht 5' 11"  (1.803 m)   Wt 90.7 kg   SpO2 100%   BMI 27.89 kg/m  Constitutional: well-appearing, laying in bed comfortably, in no acute distress HENT: normocephalic atraumatic Eyes: EOM intact Neck: supple Cardiovascular: regular rate and rhythm Pulmonary/Chest: normal work of breathing on room air MSK: normal bulk and tone Neurological: alert & oriented x 3  CN 2-12 intact  Finger-to-nose intact  Sensation of UE & LE intact bilaterally  5/5 strength UE & LE bilaterally  Pronator drift negative Skin: warm and dry Psych: pleasant, normal mood and behavior   Pertinent Labs, Studies, and Procedures:     Latest Ref Rng & Units 04/29/2022    1:11 PM 11/18/2017   11:58 PM 03/16/2013    3:25 PM  CBC  WBC 4.0 - 10.5 K/uL 10.0  8.0  9.6   Hemoglobin 13.0 - 17.0 g/dL 13.0 - 17.0 g/dL 16.3    15.6  13.2  15.3   Hematocrit 39.0 - 52.0 %  39.0 - 52.0 % 48.0    44.6  38.7  43.0   Platelets 150 - 400 K/uL 191  181  185        Latest Ref Rng & Units 05/04/2022    7:00 AM 05/03/2022    3:03 PM 05/03/2022    3:55 AM  CMP  Glucose 70 - 99 mg/dL 182   204   BUN 8 - 23 mg/dL 26   31   Creatinine 0.61 - 1.24 mg/dL 1.26  1.47  1.44   Sodium 135 - 145 mmol/L 135   137   Potassium 3.5 - 5.1 mmol/L 4.0   4.2   Chloride 98 - 111 mmol/L 103   105   CO2 22 - 32 mmol/L 28   25   Calcium 8.9 - 10.3 mg/dL 8.8   9.0     EEG adult  Result Date: 04/30/2022 Lora Havens, MD     04/30/2022  4:33 PM Patient Name: Cranford Blessinger MRN: [id number] Epilepsy Attending: Lora Havens Referring Physician/Provider: Janine Ores, NP Date: 04/30/2022 Duration: 21.06 mins Patient history:  76 y.o. male with a past medical history of diabetes, GERD, HTN, TIA and numbness and tingling of his right shoulder presenting after an episode of confusion and "staring off into space" while at a restaurant with his wife. EEG to evaluate for seizure. Level of alertness: Awake AEDs during EEG study: LEV Technical aspects:  This EEG study was done with scalp electrodes positioned according to the 10-20 International system of electrode placement. Electrical activity was reviewed with band pass filter of 1-70Hz , sensitivity of 7 uV/mm, display speed of 9m/sec with a 60Hz  notched filter applied as appropriate. EEG data were recorded continuously and digitally stored.  Video monitoring was available and reviewed as appropriate. Description: No clear posterior dominant rhythm was seen. EEG showed continuous generalized 3 to 6 Hz theta-delta slowing. Hyperventilation and photic stimulation were not performed.   Of note, study was technically difficult due to significant myogenic and electrode artifact. ABNORMALITY - Continuous slow, generalized IMPRESSION: This technically difficult study is suggestive of moderate diffuse encephalopathy, nonspecific etiology. No seizures or epileptiform discharges were seen throughout the recording. A normal interictal EEG does not exclude nor support the diagnosis of epilepsy. If suspicion for interictal activity remains a concern, a prolonged study can be considered. PLora Havens  MR BRAIN W WO CONTRAST  Result Date: 04/29/2022 CLINICAL DATA:  Altered mental status EXAM: MRI HEAD WITHOUT AND WITH CONTRAST TECHNIQUE: Multiplanar, multiecho pulse sequences of the brain and surrounding structures were obtained without and with intravenous contrast. CONTRAST:  976mGADAVIST GADOBUTROL 1 MMOL/ML IV SOLN COMPARISON:  11/19/2017 FINDINGS: Brain: There is masslike hyperintense T2-weighted signal involving the left-greater-than-right insula and temporal lobes, crossing the midline at the anterior commissure. There is no contrast enhancement. There is mild mass effect on the left lateral ventricle. No abnormal diffusion restriction. No hemorrhage. Vascular: Normal flow voids Skull and upper cervical spine: Normal marrow signal. Sinuses/Orbits: Chronic right maxillary sinusitis.  Normal orbits. Other: None  IMPRESSION: 1. Masslike hyperintense T2-weighted signal involving the left-greater-than-right insula and temporal lobes. 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 Electronically Signed   By: KeUlyses Jarred.D.   On: 04/29/2022 20:04   CT HEAD WO CONTRAST  Result Date: 04/29/2022 CLINICAL DATA:  Acute neuro deficit. Aphasia. Severe hypertension today. EXAM: CT HEAD WITHOUT CONTRAST TECHNIQUE: Contiguous axial images were obtained from the base of the skull through  the vertex without intravenous contrast. RADIATION DOSE REDUCTION: This exam was performed according to the departmental dose-optimization program which includes automated exposure control, adjustment of the mA and/or kV according to patient size and/or use of iterative reconstruction technique. COMPARISON:  CT head 11/24/2017.  MRI head 11/19/2017 FINDINGS: Brain: Extensive low-density edema is present bilaterally, left greater than right. This is centered in the left insula but extends into the left basal ganglia and left frontal and temporal lobe. This shows significant progression compared with 2019 MRI. There is also edema in the deep white matter tracks on the right extending into the right insula. Ventricle size normal. Mild flattening of the left lateral ventricle due to edema. No acute hemorrhage. Vascular: Middle cerebral arteries shows relative hyperdensity diffusely and bilaterally. This is likely due to surrounding brain edema. Skull: Negative Sinuses/Orbits: Complete opacification right maxillary sinus. Mucosal edema right frontal and ethmoid sinus. Bilateral cataract extraction Other: None IMPRESSION: Extensive low-density edema centered in the insula bilaterally, left greater than right. This was present on MRI in 2019 but shows significant progression. There is local mass-effect flattening left lateral ventricle. No acute hemorrhage. There is relative hyperdensity of the MCA bilaterally which is likely accentuated  weighted due to surrounding edema in the brain rather than arterial thrombosis. These nonspecific features could be seen with encephalitis. Consider herpes. Tumor is a consideration however the edema is not particularly masslike and was present 4 years ago although with significant progression. Also consider PRES given the history of severe hypertension today These results were called by telephone at the time of interpretation on 04/29/2022 at 2:28 pm to provider Nechama Guard, who verbally acknowledged these results. Electronically Signed   By: Franchot Gallo M.D.   On: 04/29/2022 14:30     Discharge Instructions: Discharge Instructions     Call MD for:  difficulty breathing, headache or visual disturbances   Complete by: As directed    Call MD for:  extreme fatigue   Complete by: As directed    Call MD for:  hives   Complete by: As directed    Call MD for:  persistant dizziness or light-headedness   Complete by: As directed    Call MD for:  persistant nausea and vomiting   Complete by: As directed    Call MD for:  severe uncontrolled pain   Complete by: As directed    Call MD for:  temperature >100.4   Complete by: As directed    Diet - low sodium heart healthy   Complete by: As directed    Increase activity slowly   Complete by: As directed    No wound care   Complete by: As directed        Signed: Angelique Blonder, DO 05/04/2022, 10:26 PM   Pager: [phone]

## 2022-05-05 ENCOUNTER — Encounter (HOSPITAL_COMMUNITY): Payer: Self-pay | Admitting: *Deleted

## 2022-05-05 ENCOUNTER — Other Ambulatory Visit: Payer: Self-pay

## 2022-05-05 LAB — GLUCOSE, CAPILLARY: Glucose-Capillary: 286 mg/dL — ABNORMAL HIGH (ref 70–99)

## 2022-05-05 NOTE — Progress Notes (Signed)
Spoke with pt's wife, Vickie for pre-op call. DPR on file. She states pt does not have a cardiac history and never diagnosed with HTN. He was on Plavix for hx of TIA, last dose was 04/28/22 the day before he was admitted to the hospital. Pt is a type 2 diabetic. Last A1C was 7.6 on 04/29/22. She states his fasting blood sugar was 136 this AM. Pt was discharged from the hospital yesterday after being worked for staring spells, change in mental status and confusion. Pt was found to have a brain mass.  Instructed Vickie to have pt check his blood sugar in the AM when he wakes up and every 2 hours until he leaves for the hospital. If blood sugar is 70 or below, treat with 1/2 cup of clear juice (apple or cranberry) and recheck blood sugar 15 minutes after drinking juice. If blood sugar continues to be 70 or below, call the Short Stay department and ask to speak to a nurse. Vickie voiced understanding. Instructed her to hold his Metformin in the AM and to give 1/2 of his regular dose of Lantus Insulin in the AM, he will take 12 units in the AM.   Shower instructions given to [facility] and she voiced understanding.

## 2022-05-06 ENCOUNTER — Other Ambulatory Visit: Payer: Self-pay

## 2022-05-06 ENCOUNTER — Inpatient Hospital Stay (HOSPITAL_COMMUNITY)
Admission: RE | Admit: 2022-05-06 | Discharge: 2022-05-07 | DRG: 026 | Disposition: A | Discharge status: Home / Self Care | Payer: Medicare Other | Attending: Neurosurgery | Admitting: Neurosurgery

## 2022-05-06 ENCOUNTER — Inpatient Hospital Stay (HOSPITAL_COMMUNITY)
Admission: RE | Disposition: A | Discharge status: Home / Self Care | Payer: Self-pay | Source: Home / Self Care | Attending: Neurosurgery

## 2022-05-06 ENCOUNTER — Inpatient Hospital Stay (HOSPITAL_COMMUNITY): Payer: Medicare Other | Admitting: Certified Registered"

## 2022-05-06 ENCOUNTER — Encounter (HOSPITAL_COMMUNITY): Payer: Self-pay

## 2022-05-06 DIAGNOSIS — Z8616 Personal history of COVID-19: Secondary | ICD-10-CM | POA: Diagnosis not present

## 2022-05-06 DIAGNOSIS — E1122 Type 2 diabetes mellitus with diabetic chronic kidney disease: Secondary | ICD-10-CM | POA: Diagnosis present

## 2022-05-06 DIAGNOSIS — D638 Anemia in other chronic diseases classified elsewhere: Secondary | ICD-10-CM | POA: Diagnosis present

## 2022-05-06 DIAGNOSIS — I129 Hypertensive chronic kidney disease with stage 1 through stage 4 chronic kidney disease, or unspecified chronic kidney disease: Secondary | ICD-10-CM | POA: Diagnosis present

## 2022-05-06 DIAGNOSIS — N1832 Chronic kidney disease, stage 3b: Secondary | ICD-10-CM | POA: Diagnosis present

## 2022-05-06 DIAGNOSIS — Z7902 Long term (current) use of antithrombotics/antiplatelets: Secondary | ICD-10-CM | POA: Diagnosis not present

## 2022-05-06 DIAGNOSIS — Z833 Family history of diabetes mellitus: Secondary | ICD-10-CM | POA: Diagnosis not present

## 2022-05-06 DIAGNOSIS — K219 Gastro-esophageal reflux disease without esophagitis: Secondary | ICD-10-CM | POA: Diagnosis present

## 2022-05-06 DIAGNOSIS — Z794 Long term (current) use of insulin: Secondary | ICD-10-CM

## 2022-05-06 DIAGNOSIS — D496 Neoplasm of unspecified behavior of brain: Principal | ICD-10-CM | POA: Diagnosis present

## 2022-05-06 DIAGNOSIS — Z981 Arthrodesis status: Secondary | ICD-10-CM

## 2022-05-06 DIAGNOSIS — G939 Disorder of brain, unspecified: Secondary | ICD-10-CM | POA: Diagnosis present

## 2022-05-06 DIAGNOSIS — Z79899 Other long term (current) drug therapy: Secondary | ICD-10-CM

## 2022-05-06 DIAGNOSIS — Z87891 Personal history of nicotine dependence: Secondary | ICD-10-CM | POA: Diagnosis not present

## 2022-05-06 DIAGNOSIS — F419 Anxiety disorder, unspecified: Secondary | ICD-10-CM | POA: Diagnosis present

## 2022-05-06 DIAGNOSIS — Z7984 Long term (current) use of oral hypoglycemic drugs: Secondary | ICD-10-CM | POA: Diagnosis not present

## 2022-05-06 DIAGNOSIS — Z8673 Personal history of transient ischemic attack (TIA), and cerebral infarction without residual deficits: Secondary | ICD-10-CM | POA: Diagnosis not present

## 2022-05-06 DIAGNOSIS — G9389 Other specified disorders of brain: Secondary | ICD-10-CM

## 2022-05-06 DIAGNOSIS — G9340 Encephalopathy, unspecified: Secondary | ICD-10-CM | POA: Diagnosis present

## 2022-05-06 DIAGNOSIS — Z9889 Other specified postprocedural states: Principal | ICD-10-CM

## 2022-05-06 HISTORY — DX: Anxiety disorder, unspecified: F41.9

## 2022-05-06 HISTORY — DX: Chronic kidney disease, unspecified: N18.9

## 2022-05-06 HISTORY — DX: Anemia, unspecified: D64.9

## 2022-05-06 HISTORY — PX: FRAMELESS  BIOPSY WITH BRAINLAB: SHX6879

## 2022-05-06 HISTORY — PX: APPLICATION OF CRANIAL NAVIGATION: SHX6578

## 2022-05-06 LAB — GLUCOSE, CAPILLARY
Glucose-Capillary: 191 mg/dL — ABNORMAL HIGH (ref 70–99)
Glucose-Capillary: 174 mg/dL — ABNORMAL HIGH (ref 70–99)

## 2022-05-06 LAB — MRSA NEXT GEN BY PCR, NASAL: MRSA by PCR Next Gen: NOT DETECTED

## 2022-05-06 SURGERY — FRAMELESS BIOPSY WITH BRAINLAB
Anesthesia: General | Site: Head | Laterality: Left

## 2022-05-06 MED ORDER — LOSARTAN POTASSIUM 50 MG PO TABS
100.0000 mg | ORAL_TABLET | Freq: Every day | ORAL | Status: DC
  Administered 2022-05-07: 100 mg via ORAL
  Filled 2022-05-06: qty 2

## 2022-05-06 MED ORDER — ONDANSETRON HCL 4 MG/2ML IJ SOLN: 4.0000 mg | INTRAMUSCULAR | Status: DC | PRN

## 2022-05-06 MED ORDER — POLYETHYLENE GLYCOL 3350 17 G PO PACK: 17.0000 g | PACK | Freq: Every day | ORAL | Status: DC | PRN

## 2022-05-06 MED ORDER — PHENYLEPHRINE 80 MCG/ML (10ML) SYRINGE FOR IV PUSH (FOR BLOOD PRESSURE SUPPORT)
PREFILLED_SYRINGE | INTRAVENOUS | Status: AC
  Filled 2022-05-06: qty 10

## 2022-05-06 MED ORDER — SODIUM CHLORIDE 0.9 % IV SOLN: INTRAVENOUS | Status: DC

## 2022-05-06 MED ORDER — PROMETHAZINE HCL 12.5 MG PO TABS: 12.5000 mg | ORAL_TABLET | ORAL | Status: DC | PRN

## 2022-05-06 MED ORDER — DOCUSATE SODIUM 100 MG PO CAPS
100.0000 mg | ORAL_CAPSULE | Freq: Two times a day (BID) | ORAL | Status: DC
  Administered 2022-05-06 – 2022-05-07 (×2): 100 mg via ORAL
  Filled 2022-05-06 (×2): qty 1

## 2022-05-06 MED ORDER — ESMOLOL HCL 100 MG/10ML IV SOLN
INTRAVENOUS | Status: DC | PRN
  Administered 2022-05-06: 50 mg via INTRAVENOUS
  Administered 2022-05-06: 30 mg via INTRAVENOUS

## 2022-05-06 MED ORDER — CHLORHEXIDINE GLUCONATE 0.12 % MT SOLN
15.0000 mL | Freq: Once | OROMUCOSAL | Status: AC
  Administered 2022-05-06: 15 mL via OROMUCOSAL
  Filled 2022-05-06: qty 15

## 2022-05-06 MED ORDER — THROMBIN 5000 UNITS EX SOLR
CUTANEOUS | Status: AC
Start: 2022-05-06 — End: ?
  Filled 2022-05-06: qty 10000

## 2022-05-06 MED ORDER — ACETAMINOPHEN 650 MG RE SUPP: 650.0000 mg | RECTAL | Status: DC | PRN

## 2022-05-06 MED ORDER — FENTANYL CITRATE (PF) 250 MCG/5ML IJ SOLN
INTRAMUSCULAR | Status: AC
  Filled 2022-05-06: qty 5

## 2022-05-06 MED ORDER — LABETALOL HCL 5 MG/ML IV SOLN
10.0000 mg | INTRAVENOUS | Status: DC | PRN
  Administered 2022-05-06 – 2022-05-07 (×6): 10 mg via INTRAVENOUS
  Filled 2022-05-06 (×6): qty 4

## 2022-05-06 MED ORDER — HYDROCODONE-ACETAMINOPHEN 5-325 MG PO TABS
1.0000 | ORAL_TABLET | ORAL | Status: DC | PRN
  Administered 2022-05-06: 1 via ORAL
  Filled 2022-05-06: qty 1

## 2022-05-06 MED ORDER — LIDOCAINE 2% (20 MG/ML) 5 ML SYRINGE
INTRAMUSCULAR | Status: AC
Start: 2022-05-06 — End: ?
  Filled 2022-05-06: qty 10

## 2022-05-06 MED ORDER — ENALAPRILAT 1.25 MG/ML IV SOLN
1.2500 mg | Freq: Four times a day (QID) | INTRAVENOUS | Status: DC | PRN
  Administered 2022-05-06: 1.25 mg via INTRAVENOUS
  Filled 2022-05-06: qty 1

## 2022-05-06 MED ORDER — LABETALOL HCL 5 MG/ML IV SOLN
INTRAVENOUS | Status: AC
  Filled 2022-05-06: qty 4

## 2022-05-06 MED ORDER — HEMOSTATIC AGENTS (NO CHARGE) OPTIME
TOPICAL | Status: DC | PRN
  Administered 2022-05-06: 1 via TOPICAL

## 2022-05-06 MED ORDER — CHLORHEXIDINE GLUCONATE CLOTH 2 % EX PADS: 6.0000 | MEDICATED_PAD | Freq: Once | CUTANEOUS | Status: DC

## 2022-05-06 MED ORDER — CEFAZOLIN SODIUM-DEXTROSE 1-4 GM/50ML-% IV SOLN
1.0000 g | Freq: Three times a day (TID) | INTRAVENOUS | Status: AC
  Administered 2022-05-06 – 2022-05-07 (×3): 1 g via INTRAVENOUS
  Filled 2022-05-06 (×3): qty 50

## 2022-05-06 MED ORDER — LIDOCAINE-EPINEPHRINE 1 %-1:100000 IJ SOLN
INTRAMUSCULAR | Status: AC
  Filled 2022-05-06: qty 1

## 2022-05-06 MED ORDER — NAPROXEN SODIUM 220 MG PO TABS: 220.0000 mg | ORAL_TABLET | Freq: Every day | ORAL | Status: DC | PRN

## 2022-05-06 MED ORDER — HYDRALAZINE HCL 20 MG/ML IJ SOLN
10.0000 mg | Freq: Once | INTRAMUSCULAR | Status: AC
  Administered 2022-05-06: 10 mg via INTRAVENOUS

## 2022-05-06 MED ORDER — PROPOFOL 10 MG/ML IV BOLUS
INTRAVENOUS | Status: AC
  Filled 2022-05-06: qty 20

## 2022-05-06 MED ORDER — DEXAMETHASONE 4 MG PO TABS: 4.0000 mg | ORAL_TABLET | Freq: Every day | ORAL | Status: DC

## 2022-05-06 MED ORDER — DEXAMETHASONE SODIUM PHOSPHATE 10 MG/ML IJ SOLN
INTRAMUSCULAR | Status: AC
Start: 2022-05-06 — End: ?
  Filled 2022-05-06: qty 2

## 2022-05-06 MED ORDER — ONDANSETRON HCL 4 MG PO TABS: 4.0000 mg | ORAL_TABLET | ORAL | Status: DC | PRN

## 2022-05-06 MED ORDER — THROMBIN (RECOMBINANT) 5000 UNITS EX SOLR: CUTANEOUS | Status: DC | PRN

## 2022-05-06 MED ORDER — CEFAZOLIN SODIUM-DEXTROSE 2-4 GM/100ML-% IV SOLN
2.0000 g | INTRAVENOUS | Status: AC
  Administered 2022-05-06: 2 g via INTRAVENOUS
  Filled 2022-05-06: qty 100

## 2022-05-06 MED ORDER — BUSPIRONE HCL 5 MG PO TABS: 5.0000 mg | ORAL_TABLET | Freq: Three times a day (TID) | ORAL | Status: DC | PRN

## 2022-05-06 MED ORDER — LEVETIRACETAM 500 MG PO TABS
500.0000 mg | ORAL_TABLET | Freq: Two times a day (BID) | ORAL | Status: DC
  Administered 2022-05-06 – 2022-05-07 (×2): 500 mg via ORAL
  Filled 2022-05-06 (×4): qty 1

## 2022-05-06 MED ORDER — METFORMIN HCL ER 500 MG PO TB24
500.0000 mg | ORAL_TABLET | Freq: Every day | ORAL | Status: DC
  Administered 2022-05-07: 500 mg via ORAL
  Filled 2022-05-06 (×2): qty 1

## 2022-05-06 MED ORDER — INSULIN ASPART 100 UNIT/ML IJ SOLN
0.0000 [IU] | INTRAMUSCULAR | Status: DC | PRN
  Administered 2022-05-06: 2 [IU] via SUBCUTANEOUS
  Filled 2022-05-06: qty 1

## 2022-05-06 MED ORDER — AMLODIPINE BESYLATE 5 MG PO TABS
5.0000 mg | ORAL_TABLET | Freq: Every day | ORAL | Status: DC
  Administered 2022-05-07: 5 mg via ORAL
  Filled 2022-05-06: qty 1

## 2022-05-06 MED ORDER — BACITRACIN ZINC 500 UNIT/GM EX OINT
TOPICAL_OINTMENT | CUTANEOUS | Status: DC | PRN
  Administered 2022-05-06 (×2): 1 via TOPICAL

## 2022-05-06 MED ORDER — DEXAMETHASONE SODIUM PHOSPHATE 10 MG/ML IJ SOLN
INTRAMUSCULAR | Status: DC | PRN
  Administered 2022-05-06: 10 mg via INTRAVENOUS

## 2022-05-06 MED ORDER — ACETAMINOPHEN 325 MG PO TABS: 650.0000 mg | ORAL_TABLET | ORAL | Status: DC | PRN

## 2022-05-06 MED ORDER — DEXAMETHASONE SODIUM PHOSPHATE 4 MG/ML IJ SOLN
4.0000 mg | Freq: Four times a day (QID) | INTRAMUSCULAR | Status: DC
  Administered 2022-05-06 – 2022-05-07 (×4): 4 mg via INTRAVENOUS
  Filled 2022-05-06 (×4): qty 1

## 2022-05-06 MED ORDER — ORAL CARE MOUTH RINSE: 15.0000 mL | Freq: Once | OROMUCOSAL | Status: AC

## 2022-05-06 MED ORDER — INSULIN GLARGINE-YFGN 100 UNIT/ML ~~LOC~~ SOLN
25.0000 [IU] | Freq: Every day | SUBCUTANEOUS | Status: DC
  Administered 2022-05-07: 25 [IU] via SUBCUTANEOUS
  Filled 2022-05-06: qty 0.25

## 2022-05-06 MED ORDER — PANTOPRAZOLE SODIUM 40 MG PO TBEC
40.0000 mg | DELAYED_RELEASE_TABLET | Freq: Every day | ORAL | Status: DC | PRN
  Administered 2022-05-07: 40 mg via ORAL
  Filled 2022-05-06: qty 1

## 2022-05-06 MED ORDER — NICARDIPINE HCL IN NACL 20-0.86 MG/200ML-% IV SOLN
3.0000 mg/h | INTRAVENOUS | Status: DC
  Administered 2022-05-06: 15 mg/h via INTRAVENOUS
  Administered 2022-05-06: 12.5 mg/h via INTRAVENOUS
  Administered 2022-05-06: 5 mg/h via INTRAVENOUS
  Administered 2022-05-07 (×2): 15 mg/h via INTRAVENOUS
  Administered 2022-05-07: 12.5 mg/h via INTRAVENOUS
  Administered 2022-05-07: 15 mg/h via INTRAVENOUS
  Administered 2022-05-07: 10 mg/h via INTRAVENOUS
  Filled 2022-05-06 (×9): qty 200

## 2022-05-06 MED ORDER — ESCITALOPRAM OXALATE 10 MG PO TABS
10.0000 mg | ORAL_TABLET | Freq: Every day | ORAL | Status: DC
  Administered 2022-05-07: 10 mg via ORAL
  Filled 2022-05-06: qty 1

## 2022-05-06 MED ORDER — THROMBIN 5000 UNITS EX SOLR: OROMUCOSAL | Status: DC | PRN

## 2022-05-06 MED ORDER — THROMBIN 5000 UNITS EX SOLR
CUTANEOUS | Status: AC
  Filled 2022-05-06: qty 5000

## 2022-05-06 MED ORDER — FENTANYL CITRATE (PF) 250 MCG/5ML IJ SOLN
INTRAMUSCULAR | Status: DC | PRN
Start: 2022-05-06 — End: 2022-05-07
  Administered 2022-05-06 (×2): 50 ug via INTRAVENOUS

## 2022-05-06 MED ORDER — ROCURONIUM BROMIDE 10 MG/ML (PF) SYRINGE
PREFILLED_SYRINGE | INTRAVENOUS | Status: AC
  Filled 2022-05-06: qty 20

## 2022-05-06 MED ORDER — ONDANSETRON HCL 4 MG/2ML IJ SOLN
INTRAMUSCULAR | Status: DC | PRN
  Administered 2022-05-06: 4 mg via INTRAVENOUS

## 2022-05-06 MED ORDER — LIDOCAINE-EPINEPHRINE 1 %-1:100000 IJ SOLN
INTRAMUSCULAR | Status: DC | PRN
  Administered 2022-05-06: 8 mL

## 2022-05-06 MED ORDER — BACITRACIN ZINC 500 UNIT/GM EX OINT
TOPICAL_OINTMENT | CUTANEOUS | Status: AC
  Filled 2022-05-06: qty 28.35

## 2022-05-06 MED ORDER — ROCURONIUM BROMIDE 10 MG/ML (PF) SYRINGE
PREFILLED_SYRINGE | INTRAVENOUS | Status: DC | PRN
  Administered 2022-05-06: 50 mg via INTRAVENOUS

## 2022-05-06 MED ORDER — HYDRALAZINE HCL 20 MG/ML IJ SOLN
INTRAMUSCULAR | Status: AC
  Filled 2022-05-06: qty 1

## 2022-05-06 MED ORDER — ONDANSETRON HCL 4 MG/2ML IJ SOLN
INTRAMUSCULAR | Status: AC
  Filled 2022-05-06: qty 2

## 2022-05-06 MED ORDER — MORPHINE SULFATE (PF) 2 MG/ML IV SOLN: 1.0000 mg | INTRAVENOUS | Status: DC | PRN

## 2022-05-06 MED ORDER — FENTANYL CITRATE (PF) 100 MCG/2ML IJ SOLN: 25.0000 ug | INTRAMUSCULAR | Status: DC | PRN

## 2022-05-06 MED ORDER — SIMVASTATIN 20 MG PO TABS
20.0000 mg | ORAL_TABLET | Freq: Every day | ORAL | Status: DC
  Administered 2022-05-06: 20 mg via ORAL
  Filled 2022-05-06: qty 1

## 2022-05-06 MED ORDER — FLEET ENEMA 7-19 GM/118ML RE ENEM
1.0000 | ENEMA | Freq: Once | RECTAL | Status: DC | PRN
Start: 2022-05-06 — End: 2022-05-07

## 2022-05-06 MED ORDER — POTASSIUM CHLORIDE IN NACL 20-0.9 MEQ/L-% IV SOLN
INTRAVENOUS | Status: DC
  Filled 2022-05-06 (×2): qty 1000

## 2022-05-06 MED ORDER — ACETAMINOPHEN 10 MG/ML IV SOLN
1000.0000 mg | Freq: Once | INTRAVENOUS | Status: DC | PRN
Start: 2022-05-06 — End: 2022-05-06

## 2022-05-06 MED ORDER — 0.9 % SODIUM CHLORIDE (POUR BTL) OPTIME
TOPICAL | Status: DC | PRN
  Administered 2022-05-06: 1000 mL

## 2022-05-06 MED ORDER — EPHEDRINE SULFATE-NACL 50-0.9 MG/10ML-% IV SOSY
PREFILLED_SYRINGE | INTRAVENOUS | Status: DC | PRN
  Administered 2022-05-06: 5 mg via INTRAVENOUS

## 2022-05-06 MED ORDER — PHENYLEPHRINE HCL-NACL 20-0.9 MG/250ML-% IV SOLN
INTRAVENOUS | Status: DC | PRN
  Administered 2022-05-06: 25 ug/min via INTRAVENOUS

## 2022-05-06 MED ORDER — PEN NEEDLES 32G X 4 MM MISC: 1.0000 | Freq: Every day | Status: DC

## 2022-05-06 MED ORDER — PROPOFOL 10 MG/ML IV BOLUS
INTRAVENOUS | Status: DC | PRN
  Administered 2022-05-06 (×2): 50 mg via INTRAVENOUS
  Administered 2022-05-06: 150 mg via INTRAVENOUS

## 2022-05-06 MED ORDER — DIPHENHYDRAMINE HCL 25 MG PO CAPS
25.0000 mg | ORAL_CAPSULE | Freq: Four times a day (QID) | ORAL | Status: DC | PRN
Start: 2022-05-06 — End: 2022-05-07

## 2022-05-06 MED ORDER — LACTATED RINGERS IV SOLN: INTRAVENOUS | Status: DC

## 2022-05-06 MED ORDER — SUGAMMADEX SODIUM 200 MG/2ML IV SOLN
INTRAVENOUS | Status: DC | PRN
  Administered 2022-05-06: 200 mg via INTRAVENOUS

## 2022-05-06 MED ORDER — LIDOCAINE 2% (20 MG/ML) 5 ML SYRINGE
INTRAMUSCULAR | Status: DC | PRN
  Administered 2022-05-06: 40 mg via INTRAVENOUS

## 2022-05-06 SURGICAL SUPPLY — 90 items
BAG COUNTER SPONGE SURGICOUNT (BAG) ×2 IMPLANT
BAND RUBBER #18 3X1/16 STRL (MISCELLANEOUS) ×4 IMPLANT
BENZOIN TINCTURE PRP APPL 2/3 (GAUZE/BANDAGES/DRESSINGS) IMPLANT
BIT DRILL LONG 3.0X30 (BIT) IMPLANT
BLADE CLIPPER SURG (BLADE) ×2 IMPLANT
BNDG GAUZE DERMACEA FLUFF 4 (GAUZE/BANDAGES/DRESSINGS) IMPLANT
BNDG STRETCH 4X75 STRL LF (GAUZE/BANDAGES/DRESSINGS) IMPLANT
BUR 14 MATCH 3 (BUR) IMPLANT
BUR ACORN 9.0 PRECISION (BURR) IMPLANT
BUR ROUND FLUTED 4 SOFT TCH (BURR) IMPLANT
BURR 14 MATCH 3 (BUR) ×2
CANISTER SUCT 3000ML PPV (MISCELLANEOUS) ×4 IMPLANT
CLSR STERI-STRIP ANTIMIC 1/2X4 (GAUZE/BANDAGES/DRESSINGS) IMPLANT
CNTNR URN SCR LID CUP LEK RST (MISCELLANEOUS) ×2 IMPLANT
CONT SPEC 4OZ STRL OR WHT (MISCELLANEOUS) ×4
COVER BURR HOLE 7 (Orthopedic Implant) IMPLANT
COVER MAYO STAND STRL (DRAPES) IMPLANT
COVERAGE SUPPORT O-ARM STEALTH (MISCELLANEOUS) ×2 IMPLANT
DERMABOND ADVANCED (GAUZE/BANDAGES/DRESSINGS) ×2
DERMABOND ADVANCED .7 DNX12 (GAUZE/BANDAGES/DRESSINGS) IMPLANT
DRAPE HALF SHEET 40X57 (DRAPES) ×2 IMPLANT
DRAPE ORTHO SPLIT 77X108 STRL (DRAPES) ×2
DRAPE SHEET LG 3/4 BI-LAMINATE (DRAPES) ×2 IMPLANT
DRAPE STERI IOBAN 125X83 (DRAPES) IMPLANT
DRAPE SURG 17X23 STRL (DRAPES) IMPLANT
DRAPE SURG ORHT 6 SPLT 77X108 (DRAPES) ×2 IMPLANT
DRAPE WARM FLUID 44X44 (DRAPES) ×2 IMPLANT
DRSG ADAPTIC 3X8 NADH LF (GAUZE/BANDAGES/DRESSINGS) IMPLANT
DRSG TELFA 3X8 NADH (GAUZE/BANDAGES/DRESSINGS) ×2 IMPLANT
DURAPREP 6ML APPLICATOR 50/CS (WOUND CARE) ×2 IMPLANT
ELECT COATED BLADE 2.86 ST (ELECTRODE) ×2 IMPLANT
ELECT REM PT RETURN 9FT ADLT (ELECTROSURGICAL) ×2
ELECTRODE REM PT RTRN 9FT ADLT (ELECTROSURGICAL) ×2 IMPLANT
FEE COVERAGE SUPPORT O-ARM (MISCELLANEOUS) IMPLANT
GAUZE 4X4 16PLY ~~LOC~~+RFID DBL (SPONGE) IMPLANT
GAUZE SPONGE 4X4 12PLY STRL (GAUZE/BANDAGES/DRESSINGS) IMPLANT
GLOVE BIO SURGEON STRL SZ7.5 (GLOVE) ×4 IMPLANT
GLOVE BIOGEL PI IND STRL 7.5 (GLOVE) ×2 IMPLANT
GLOVE BIOGEL PI INDICATOR 7.5 (GLOVE) ×2
GLOVE ECLIPSE 7.5 STRL STRAW (GLOVE) IMPLANT
GLOVE EXAM NITRILE LRG STRL (GLOVE) IMPLANT
GLOVE EXAM NITRILE XS STR PU (GLOVE) IMPLANT
GLOVE SURG ENC MOIS LTX SZ8 (GLOVE) ×4 IMPLANT
GLOVE SURG UNDER POLY LF SZ8.5 (GLOVE) ×4 IMPLANT
GOWN STRL REUS W/ TWL LRG LVL3 (GOWN DISPOSABLE) ×4 IMPLANT
GOWN STRL REUS W/ TWL XL LVL3 (GOWN DISPOSABLE) ×4 IMPLANT
GOWN STRL REUS W/TWL 2XL LVL3 (GOWN DISPOSABLE) IMPLANT
GOWN STRL REUS W/TWL LRG LVL3 (GOWN DISPOSABLE) ×4
GOWN STRL REUS W/TWL XL LVL3 (GOWN DISPOSABLE) ×4
HEMOSTAT POWDER KIT SURGIFOAM (HEMOSTASIS) IMPLANT
HEMOSTAT SURGICEL 2X14 (HEMOSTASIS) IMPLANT
KIT BASIN OR (CUSTOM PROCEDURE TRAY) ×2 IMPLANT
KIT NDL BIOPSY 1.8X235 CRAN (NEEDLE) ×2 IMPLANT
KIT NDL BIOPSY PASSIVE (NEEDLE) IMPLANT
KIT NEEDLE BIOPSY 1.8X235 CRAN (NEEDLE) ×2 IMPLANT
KIT NEEDLE BIOPSY PASSIVE (NEEDLE) ×2 IMPLANT
KIT TURNOVER KIT B (KITS) ×2 IMPLANT
KIT VARIOGUIDE DRILL 1.9 (KITS) ×2 IMPLANT
MARKER SPHERE PSV REFLC 13MM (MARKER) ×4 IMPLANT
MARKER SPHERE PSV REFLC NDI (MISCELLANEOUS) IMPLANT
NDL SPNL 18GX3.5 QUINCKE PK (NEEDLE) ×2 IMPLANT
NEEDLE HYPO 22GX1.5 SAFETY (NEEDLE) ×2 IMPLANT
NEEDLE SPNL 18GX3.5 QUINCKE PK (NEEDLE) ×2 IMPLANT
NS IRRIG 1000ML POUR BTL (IV SOLUTION) ×6 IMPLANT
PACK CRANIOTOMY CUSTOM (CUSTOM PROCEDURE TRAY) ×2 IMPLANT
PAD DRESSING TELFA 3X8 NADH (GAUZE/BANDAGES/DRESSINGS) ×2 IMPLANT
PERFORATOR LRG  14-11MM (BIT) ×2
PERFORATOR LRG 14-11MM (BIT) IMPLANT
PIN MAYFIELD SKULL DISP (PIN) ×2 IMPLANT
SCREW UNIII AXS SD 1.5X4 (Screw) IMPLANT
SPIKE FLUID TRANSFER (MISCELLANEOUS) ×2 IMPLANT
SPONGE SURGIFOAM ABS GEL SZ50 (HEMOSTASIS) ×2 IMPLANT
STAPLER VISISTAT 35W (STAPLE) ×2 IMPLANT
STRIP CLOSURE SKIN 1/2X4 (GAUZE/BANDAGES/DRESSINGS) IMPLANT
SUPPORT TECH COVERAGE MED NAV (MISCELLANEOUS) ×2
SUT ETHILON 3 0 FSL (SUTURE) IMPLANT
SUT ETHILON 3 0 PS 1 (SUTURE) IMPLANT
SUT MNCRL AB 4-0 PS2 18 (SUTURE) IMPLANT
SUT MON AB 3-0 SH 27 (SUTURE)
SUT MON AB 3-0 SH27 (SUTURE) IMPLANT
SUT NURALON 4 0 TR CR/8 (SUTURE) IMPLANT
SUT SILK 0 TIES 10X30 (SUTURE) IMPLANT
SUT VIC AB 2-0 CP2 18 (SUTURE) IMPLANT
SUT VICRYL RAPIDE 3/0 (SUTURE) IMPLANT
SYR 3ML LL SCALE MARK (SYRINGE) ×6 IMPLANT
TOWEL GREEN STERILE (TOWEL DISPOSABLE) ×2 IMPLANT
TOWEL GREEN STERILE FF (TOWEL DISPOSABLE) ×2 IMPLANT
TRAY FOLEY MTR SLVR 16FR STAT (SET/KITS/TRAYS/PACK) ×2 IMPLANT
UNDERPAD 30X36 HEAVY ABSORB (UNDERPADS AND DIAPERS) ×2 IMPLANT
WATER STERILE IRR 1000ML POUR (IV SOLUTION) ×2 IMPLANT

## 2022-05-06 NOTE — H&P (Signed)
CC: brain lesion  HPI:     Patient is a 76 y.o. 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  He is here for biopsy to establish diagnosis.  He has held his Plavix for 8 days.    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   TIA (transient ischemic attack) 11/19/2017   Transient neurologic deficit 11/19/2017   Chronic pain 11/19/2017   Essential hypertension 07/04/2016   Type 2 diabetes mellitus with stage 3b chronic kidney disease, without long-term current use of insulin ([place]) 07/04/2016   Past Medical History:  Diagnosis Date   Anemia    Anxiety    Chronic kidney disease    Stage 3   COVID 08/2021   very mild   Diabetes mellitus without complication (HCC)    Environmental allergies    Hx: of   GERD (gastroesophageal reflux disease)    Hypertension    Numbness and tingling    Hx; of  Right shoulder    Past Surgical History:  Procedure Laterality Date   ANTERIOR CERVICAL DECOMP/DISCECTOMY FUSION N/A 03/17/2013   Procedure: ANTERIOR CERVICAL DECOMPRESSION/DISCECTOMY FUSION 2 LEVEL C5-7;  Surgeon: [name], MD;  Location: [place];  Service: Orthopedics;  Laterality: N/A;   COLONOSCOPY     Hx: of   FRACTURE SURGERY Right    knee   HERNIA REPAIR     TONSILLECTOMY      Medications Prior to Admission  Medication Sig Dispense Refill Last Dose   amLODipine (NORVASC) 5 MG tablet Take 1 tablet (5 mg total) by mouth daily. 30 tablet 0 05/06/2022 at 0830   dexamethasone (DECADRON) 4 MG tablet Take 1 tablet (4 mg total) by mouth daily. 30 tablet 0 05/06/2022 at 0830   escitalopram (LEXAPRO) 10 MG tablet Take 10 mg by mouth daily.   05/06/2022 at 0830   insulin glargine (LANTUS) 100 UNIT/ML Solostar Pen Inject 25 Units into the skin  daily. 15 mL 1 05/06/2022 at 0900   levETIRAcetam (KEPPRA) 500 MG tablet Take 1 tablet (500 mg total) by mouth 2 (two) times daily. 30 tablet 0 05/06/2022 at 0830   losartan (COZAAR) 100 MG tablet Take 100 mg by mouth daily.   05/06/2022 at 0830   metFORMIN (GLUCOPHAGE-XR) 500 MG 24 hr tablet Take 500-1,000 mg by mouth See admin instructions. Take 500 mg with breakfast and 1000 mg with supper   05/05/2022   naproxen sodium (ALEVE) 220 MG tablet Take 220-440 mg by mouth daily as needed (pain).   Past Month   simvastatin (ZOCOR) 20 MG tablet Take 2 tablets (40 mg total) by mouth at bedtime. (Patient taking differently: Take 20 mg by mouth at bedtime.) 30 tablet 0 05/05/2022   blood glucose meter kit and supplies KIT Dispense based on patient and insurance preference. Use up to four times daily as directed. 1 each 0    busPIRone (BUSPAR) 5 MG tablet Take 5 mg by mouth 3 (three) times daily as needed (anxiety).   More than a month   diphenhydrAMINE (BENADRYL) 25 MG tablet Take 25 mg by mouth every 6 (six) hours as needed for allergies.   Unknown   Insulin Pen Needle (PEN NEEDLES) 32G X 4 MM MISC 1 Needle by Does not apply route daily. 30 each  2    pantoprazole (PROTONIX) 40 MG tablet Take 40 mg by mouth daily as needed (acid reflux).   Unknown   No Known Allergies  Social History   Tobacco Use   Smoking status: Former    Types: Cigars   Smokeless tobacco: Never  Substance Use Topics   Alcohol use: No    Family History  Problem Relation Age of Onset   Diabetes Brother      Review of Systems Pertinent items are noted in HPI.  Objective:   Patient Vitals for the past 8 hrs:  BP Temp Pulse Resp SpO2 Height Weight  05/06/22 1312 (!) 176/85 (!) 97.5 F (36.4 C) 65 17 94 % _0  (1.803 m) 90 kg   No intake/output data recorded. No intake/output data recorded.    Awake, alert, oriented to person, place, month Cognitive slowing noted No pronator drift Speech fluent Breathing  comfortably Abd soft Ext wwp  Data ReviewCBC:  Lab Results  Component Value Date   WBC 10.0 04/29/2022   RBC 4.96 04/29/2022   BMP:  Lab Results  Component Value Date   GLUCOSE 182 (H) 05/04/2022   CO2 28 05/04/2022   BUN 26 (H) 05/04/2022   CREATININE 1.26 (H) 05/04/2022   CALCIUM 8.8 (L) 05/04/2022   Radiology review:  Diffuse infiltrating expansile process with most disease in left temporal lobe.  No enhancement seen  Assessment:   Active Problems:   * No active hospital problems. * 76 yo M with infiltrating CNS lesion  Plan:   - stereotactic brain biopsy today. -   I discussed the general technique of surgery, as well as risk, benefits, alternatives, and expected convalescence.  Risk discussed included, but were not limited to, bleeding, pain, infection, scar, nondiagnostic tissue, neurologic deficit, coma, and death.  Informed consent obtained from patient and family

## 2022-05-06 NOTE — Op Note (Signed)
Procedure(s): STEREOTACTIC BRAIN BIOPSY APPLICATION OF CRANIAL NAVIGATION VARIO GUIDE Procedure Note  Jorge [name] male 76 y.o. 05/06/2022  Procedure(s) and Anesthesia Type:    * STEREOTACTIC BRAIN BIOPSY - General    * APPLICATION OF CRANIAL NAVIGATION VARIO GUIDE - General  Surgeon(s) and Role:    Marcello Moores, Dorcas Carrow, MD - Primary   Indications: This is a 76 year old man who presented with encephalopathy and was found to have diffuse infiltrative masslike lesion involving both hemispheres, with the bulk of disease in the left insula and temporal lobe.  After interdisciplinary discussion, biopsy of the lesion was recommended.  Risk, benefits, alternatives, and expected convalescence were discussed with the patient and family.  Informed consent was obtained.     Surgeon: Vallarie Mare   Assistants: Weston Brass, NP.  Please note, there were no qualified trainees available to assist with the procedure.    Anesthesia: General endotracheal anesthesia   Procedure Detail  1.  Left temporal burr hole for BRAIN BIOPSY,  2.  Use of intraoperative neuro navigation  The patient was brought to the op room.  After general anesthesia was induced, patient was intubated by the anesthesia service.  Appropriate lines and monitors placed.  Patient was positioned supine with head turned to the right.  His head was placed in [place] head holder and affixed to the bed.  Preoperative thin cut MRI and CT were reconstructed into a 3D image which was used to perform registration using the Medtronic Stealth system.  As his disease was nonenhancing and fairly diffuse, the yield of the biopsy would likely be improved with excisional biopsy rather than needle biopsy.  Using navigation, an incision was planned over the left temporal lesion.  Scalp was preprepped with alcohol and prepped and draped in sterile fashion.  Percent lidocaine with epinephrine was injected into the skin.  A timeout was performed.   Preoperative antibiotics were administered.  Incision was made with a 10 blade.  The anterior branch of the superior temporal artery was identified and preserved.  The temporalis fascia and muscle was incised sharply and self-retaining retractor was placed.  A perforator was used to make a bur hole in the squamosal temporal bone.  The dura was then coagulated and cut sharply in cruciate manner.  Edematous and bulging temporal lobe was encountered.  Corticotomy was made and the neocortex was sent as permanent specimen.  Using navigation, additional subcortical sampling with micropituitary was performed.  The tissue had a liquidy consistency relative to normal brain.  Meticulous hemostasis was obtained.  Frozen section returned as hypercellular tissue.  A piece of Gelfoam was placed over the dural defect.  A small cranial plate was affixed to the skull over the bur hole.  The wound was irrigated thoroughly.  The muscle was closed with 2-0 Vicryl stitches.  The skin was closed with 2-0 Vicryl stitches followed by 4-0 Monocryl in subcuticular manner.  Steri-Strips and a sterile dressing were placed.  Patient was then removed from [place] head holder and extubated by the anesthesia service.  All counts were correct at the end of surgery.  No complications were noted.  Findings: Frozen section consistent with hypercellular tissue.  Grossly, the tissue was more liquidy than normal brain.  Estimated Blood Loss:  Minimal         Drains: None         Total IV Fluids: See anesthesia records  Blood Given: None         Specimens: Left  temporal neocortex, left temporal lobe lesion         Implants: Stryker cranial plating        Complications:  * No complications entered in OR log *         Disposition: PACU - hemodynamically stable.         Condition: stable

## 2022-05-06 NOTE — Anesthesia Preprocedure Evaluation (Addendum)
Anesthesia Evaluation  Patient identified by MRN, date of birth, ID band Patient awake    Reviewed: Allergy & Precautions, NPO status , Patient's Chart, lab work & pertinent test results  Airway Mallampati: II  TM Distance: >3 FB Neck ROM: Full    Dental no notable dental hx.    Pulmonary neg pulmonary ROS, former smoker,    Pulmonary exam normal        Cardiovascular hypertension, Pt. on medications  Rhythm:Regular Rate:Normal     Neuro/Psych Anxiety Brain mass TIA   GI/Hepatic Neg liver ROS, GERD  Medicated,  Endo/Other  diabetes, Type 2, Oral Hypoglycemic Agents, Insulin Dependent  Renal/GU CRFRenal disease  negative genitourinary   Musculoskeletal negative musculoskeletal ROS (+)   Abdominal Normal abdominal exam  (+)   Peds  Hematology  (+) Blood dyscrasia, anemia ,   Anesthesia Other Findings   Reproductive/Obstetrics                            Anesthesia Physical Anesthesia Plan  ASA: 3  Anesthesia Plan: General   Post-op Pain Management:    Induction: Intravenous  PONV Risk Score and Plan: 2 and Ondansetron, Dexamethasone and Treatment may vary due to age or medical condition  Airway Management Planned: Mask and Oral ETT  Additional Equipment: Arterial line  Intra-op Plan:   Post-operative Plan: Extubation in OR  Informed Consent: I have reviewed the patients History and Physical, chart, labs and discussed the procedure including the risks, benefits and alternatives for the proposed anesthesia with the patient or authorized representative who has indicated his/her understanding and acceptance.     Dental advisory given  Plan Discussed with: CRNA  Anesthesia Plan Comments: (Lab Results      Component                Value               Date                      WBC                      10.0                04/29/2022                HGB                      16.3                 04/29/2022                HGB                      15.6                04/29/2022                HCT                      48.0                04/29/2022                HCT                      44.6  04/29/2022                MCV                      89.9                04/29/2022                PLT                      191                 04/29/2022           Lab Results      Component                Value               Date                      NA                       135                 05/04/2022                K                        4.0                 05/04/2022                CO2                      28                  05/04/2022                GLUCOSE                  182 (H)             05/04/2022                BUN                      26 (H)              05/04/2022                CREATININE               1.26 (H)            05/04/2022                CALCIUM                  8.8 (L)             05/04/2022                GFRNONAA                 59 (L)              05/04/2022          )       Anesthesia Quick Evaluation

## 2022-05-06 NOTE — Transfer of Care (Signed)
Immediate Anesthesia Transfer of Care Note  Patient: Jorge [name]  Procedure(s) Performed: STEREOTACTIC BRAIN BIOPSY (Left: Head) APPLICATION OF CRANIAL NAVIGATION VARIO GUIDE (Left)  Patient Location: PACU  Anesthesia Type:General  Level of Consciousness: drowsy  Airway & Oxygen Therapy: Patient Spontanous Breathing  Post-op Assessment: Report given to RN and Post -op Vital signs reviewed and stable  Post vital signs: Reviewed and stable  Last Vitals:  Vitals Value Taken Time  BP 159/76 05/06/22 1730  Temp    Pulse 70 05/06/22 1735  Resp 15 05/06/22 1735  SpO2 93 % 05/06/22 1735  Vitals shown include unvalidated device data.  Last Pain:  Vitals:   05/06/22 1318  PainSc: 0-No pain         Complications: No notable events documented.

## 2022-05-06 NOTE — Anesthesia Procedure Notes (Signed)
Arterial Line Insertion Start/End8/29/2023 2:01 PM, 05/06/2022 2:06 PM Performed by: Gaylene Brooks, CRNA, CRNA  Preanesthetic checklist: patient identified, IV checked, site marked, risks and benefits discussed, surgical consent, monitors and equipment checked, pre-op evaluation and timeout performed Lidocaine 1% used for infiltration Right, radial was placed Catheter size: 20 G Hand hygiene performed  and maximum sterile barriers used   Attempts: 1 Procedure performed without using ultrasound guided technique. Following insertion, dressing applied and Biopatch. Post procedure assessment: normal and unchanged  Patient tolerated the procedure well with no immediate complications.

## 2022-05-06 NOTE — Progress Notes (Signed)
Dr. Zada Finders paged about patient's blood pressure. SBP goal was under 140. Dr. Zada Finders made aware that pressure via blood pressure cuff is within goal, but that arterial line pressure is reading above goal. Dr. Zada Finders made aware that patient is on cardene and drip rate is currently at max, and that patient has received enalaprilat, and still remains over 140 SBP. Dr. Zada Finders said less than 160 SBP is okay.

## 2022-05-06 NOTE — Anesthesia Procedure Notes (Signed)
Procedure Name: Intubation Date/Time: 05/06/2022 3:34 PM  Performed by: Georgia Duff, CRNAPre-anesthesia Checklist: Patient identified, Emergency Drugs available, Suction available and Patient being monitored Patient Re-evaluated:Patient Re-evaluated prior to induction Oxygen Delivery Method: Circle system utilized Preoxygenation: Pre-oxygenation with 100% oxygen Induction Type: IV induction Ventilation: Mask ventilation without difficulty Laryngoscope Size: Mac and 4 Grade View: Grade I Tube type: Oral Tube size: 7.5 mm Number of attempts: 1 Airway Equipment and Method: Stylet Placement Confirmation: ETT inserted through vocal cords under direct vision, positive ETCO2 and breath sounds checked- equal and bilateral Secured at: 23 cm Tube secured with: Tape Dental Injury: Teeth and Oropharynx as per pre-operative assessment

## 2022-05-07 ENCOUNTER — Inpatient Hospital Stay (HOSPITAL_COMMUNITY): Payer: Medicare Other

## 2022-05-07 ENCOUNTER — Other Ambulatory Visit: Payer: Self-pay | Admitting: Radiation Therapy

## 2022-05-07 MED ORDER — HYDROCODONE-ACETAMINOPHEN 5-325 MG PO TABS: 1.0000 | ORAL_TABLET | Freq: Four times a day (QID) | ORAL | 0 refills | Status: DC | PRN

## 2022-05-07 MED ORDER — CHLORHEXIDINE GLUCONATE CLOTH 2 % EX PADS
6.0000 | MEDICATED_PAD | Freq: Every day | CUTANEOUS | Status: DC
  Administered 2022-05-07: 6 via TOPICAL

## 2022-05-07 MED FILL — Thrombin For Soln 5000 Unit: CUTANEOUS | Qty: 2 | Status: AC

## 2022-05-07 NOTE — Discharge Summary (Signed)
Physician Discharge Summary  Patient ID: Jorge [name] MRN: [id number] DOB/AGE: Feb 04, 1946 76 y.o.  Admit date: 05/06/2022 Discharge date: 05/07/2022  Admission Diagnoses: Encephalopathy, brain mass  Discharge Diagnoses: Encephalopathy, brain mass Principal Problem:   Status post stereotactic brain biopsy Active Problems:   Brain tumor [facility])   Discharged Condition: good  Hospital Course: The patient was admitted on 05/06/2022 and taken to the operating room where the patient underwent Stereotactic brain biopsy. The patient tolerated the procedure well and was taken to the recovery room and then to the floor in stable condition. The hospital course was routine. There were no complications. The wound remained clean dry and intact. Pt had appropriate  surgical site soreness. No complaints of  headaches or new  neurological symptoms. The patient remained afebrile with stable vital signs, and tolerated a regular diet. The patient continued to increase activities, and pain was well controlled with oral pain medications.   Consults: None  Significant Diagnostic Studies: radiology: CT scan: Head  Treatments: surgery:  Stereotactic brain biopsy Application of cranial navigation vario guide  Discharge Exam: Blood pressure (!) 102/46, pulse 79, temperature 99.4 F (37.4 C), temperature source Axillary, resp. rate 14, height 5' 11"  (1.803 m), weight 90 kg, SpO2 97 %. Physical Exam: Patient is sleeping comfortably. He arouses easily to voice. O X 3, conversant, and in good spirits. They are in NAD and VSS. Doing well. Speech is fluent and appropriate. MAEW with good strength that is symmetric bilaterally.  BUE 5/5 throughout, BLE 5/5 throughout. Sensation to light touch is intact. PERLA, EOMI. CNs grossly intact. Dressing is clean dry intact. Incision is well approximated with no drainage, erythema, or edema.    Disposition:  Discharge disposition: 01-Home or Self Care        Allergies as  of 05/07/2022   No Known Allergies      Medication List     TAKE these medications    amLODipine 5 MG tablet Commonly known as: NORVASC Take 1 tablet (5 mg total) by mouth daily.   blood glucose meter kit and supplies Kit Dispense based on patient and insurance preference. Use up to four times daily as directed.   busPIRone 5 MG tablet Commonly known as: BUSPAR Take 5 mg by mouth 3 (three) times daily as needed (anxiety).   dexamethasone 4 MG tablet Commonly known as: DECADRON Take 1 tablet (4 mg total) by mouth daily.   diphenhydrAMINE 25 MG tablet Commonly known as: BENADRYL Take 25 mg by mouth every 6 (six) hours as needed for allergies.   escitalopram 10 MG tablet Commonly known as: LEXAPRO Take 10 mg by mouth daily.   HYDROcodone-acetaminophen 5-325 MG tablet Commonly known as: NORCO/VICODIN Take 1 tablet by mouth every 6 (six) hours as needed for moderate pain.   insulin glargine 100 UNIT/ML Solostar Pen Commonly known as: LANTUS Inject 25 Units into the skin daily.   levETIRAcetam 500 MG tablet Commonly known as: KEPPRA Take 1 tablet (500 mg total) by mouth 2 (two) times daily.   losartan 100 MG tablet Commonly known as: COZAAR Take 100 mg by mouth daily.   metFORMIN 500 MG 24 hr tablet Commonly known as: GLUCOPHAGE-XR Take 500-1,000 mg by mouth See admin instructions. Take 500 mg with breakfast and 1000 mg with supper   naproxen sodium 220 MG tablet Commonly known as: ALEVE Take 220-440 mg by mouth daily as needed (pain).   pantoprazole 40 MG tablet Commonly known as: PROTONIX Take 40 mg by mouth daily  as needed (acid reflux).   Pen Needles 32G X 4 MM Misc 1 Needle by Does not apply route daily.   simvastatin 20 MG tablet Commonly known as: ZOCOR Take 2 tablets (40 mg total) by mouth at bedtime. What changed: how much to take         Signed: Marvis Moeller 05/07/2022, 1:38 PM

## 2022-05-07 NOTE — Anesthesia Postprocedure Evaluation (Signed)
Anesthesia Post Note  Patient: Jorge [name]  Procedure(s) Performed: STEREOTACTIC BRAIN BIOPSY (Left: Head) APPLICATION OF CRANIAL NAVIGATION VARIO GUIDE (Left)     Patient location during evaluation: PACU Anesthesia Type: General Level of consciousness: awake and alert Pain management: pain level controlled Vital Signs Assessment: post-procedure vital signs reviewed and stable Respiratory status: spontaneous breathing, nonlabored ventilation, respiratory function stable and patient connected to nasal cannula oxygen Cardiovascular status: blood pressure returned to baseline and stable Postop Assessment: no apparent nausea or vomiting Anesthetic complications: no   No notable events documented.  Last Vitals:  Vitals:   05/07/22 0742 05/07/22 0800  BP:    Pulse: 77   Resp: 19   Temp:  36.8 C  SpO2: 93%     Last Pain:  Vitals:   05/07/22 0800  TempSrc: Oral  PainSc:                  [name]

## 2022-05-08 ENCOUNTER — Encounter (HOSPITAL_COMMUNITY): Payer: Self-pay | Admitting: Neurosurgery

## 2022-05-11 ENCOUNTER — Other Ambulatory Visit: Payer: Self-pay | Admitting: Student

## 2022-05-14 NOTE — Telephone Encounter (Signed)
Patient seen on inpatient service last month. He will establish care 9/12 with Dr. Carin Primrose. I will refill one month supply of keppra at this time.

## 2022-05-15 NOTE — Progress Notes (Signed)
Dx clarification for coding:  HSV encephalitis ruled out.

## 2022-05-19 ENCOUNTER — Inpatient Hospital Stay: Payer: Medicare Other

## 2022-05-20 ENCOUNTER — Ambulatory Visit (INDEPENDENT_AMBULATORY_CARE_PROVIDER_SITE_OTHER): Payer: Medicare Other | Admitting: Internal Medicine

## 2022-05-20 ENCOUNTER — Encounter: Payer: Self-pay | Admitting: Student

## 2022-05-20 VITALS — BP 152/67 | HR 69 | Ht 71.0 in | Wt 188.2 lb

## 2022-05-20 DIAGNOSIS — Z794 Long term (current) use of insulin: Secondary | ICD-10-CM

## 2022-05-20 DIAGNOSIS — I10 Essential (primary) hypertension: Secondary | ICD-10-CM

## 2022-05-20 DIAGNOSIS — I129 Hypertensive chronic kidney disease with stage 1 through stage 4 chronic kidney disease, or unspecified chronic kidney disease: Secondary | ICD-10-CM | POA: Diagnosis present

## 2022-05-20 DIAGNOSIS — N1832 Chronic kidney disease, stage 3b: Secondary | ICD-10-CM | POA: Diagnosis not present

## 2022-05-20 DIAGNOSIS — Z9889 Other specified postprocedural states: Secondary | ICD-10-CM

## 2022-05-20 DIAGNOSIS — E1122 Type 2 diabetes mellitus with diabetic chronic kidney disease: Secondary | ICD-10-CM | POA: Diagnosis not present

## 2022-05-20 DIAGNOSIS — D496 Neoplasm of unspecified behavior of brain: Secondary | ICD-10-CM

## 2022-05-20 DIAGNOSIS — Z87891 Personal history of nicotine dependence: Secondary | ICD-10-CM | POA: Diagnosis not present

## 2022-05-20 DIAGNOSIS — G40909 Epilepsy, unspecified, not intractable, without status epilepticus: Secondary | ICD-10-CM | POA: Diagnosis not present

## 2022-05-20 DIAGNOSIS — Z7984 Long term (current) use of oral hypoglycemic drugs: Secondary | ICD-10-CM

## 2022-05-20 LAB — SURGICAL PATHOLOGY

## 2022-05-20 MED ORDER — SIMVASTATIN 40 MG PO TABS: 40.0000 mg | ORAL_TABLET | Freq: Every day | ORAL | 1 refills | Status: AC

## 2022-05-20 MED ORDER — LEVETIRACETAM 500 MG PO TABS
500.0000 mg | ORAL_TABLET | Freq: Two times a day (BID) | ORAL | 0 refills | Status: DC
Start: 2022-05-20 — End: 2022-06-26

## 2022-05-20 MED ORDER — PANTOPRAZOLE SODIUM 40 MG PO TBEC: 40.0000 mg | DELAYED_RELEASE_TABLET | Freq: Every day | ORAL | 0 refills | Status: AC | PRN

## 2022-05-20 MED ORDER — ESCITALOPRAM OXALATE 10 MG PO TABS: 10.0000 mg | ORAL_TABLET | Freq: Every day | ORAL | 1 refills | Status: DC

## 2022-05-20 MED ORDER — AMLODIPINE BESYLATE 5 MG PO TABS: 5.0000 mg | ORAL_TABLET | Freq: Every day | ORAL | 1 refills | Status: DC

## 2022-05-20 MED ORDER — METFORMIN HCL ER 500 MG PO TB24: 500.0000 mg | ORAL_TABLET | ORAL | 1 refills | Status: DC

## 2022-05-20 MED ORDER — LOSARTAN POTASSIUM 100 MG PO TABS: 100.0000 mg | ORAL_TABLET | Freq: Every day | ORAL | 1 refills | Status: DC

## 2022-05-20 NOTE — Assessment & Plan Note (Signed)
Currently taking losartan 100 mg daily and amlodipine 5 mg daily.  His blood pressure is mildly elevated at 152/67 today.  He was previously on carvedilol.  I will have them continue the current medications of losartan and amlodipine start checking home readings daily.  We will see if we get off Decadron plan to follow-up in 4 weeks.

## 2022-05-20 NOTE — Assessment & Plan Note (Signed)
We will plan to keep him on Keppra 500 mg twice daily for now.

## 2022-05-20 NOTE — Assessment & Plan Note (Signed)
Currently on Lantus 25 units with metformin 1500 mg daily total.  Likely experiencing some postprandial hyperglycemia.  We will see if he can come off Decadron which will help this otherwise may need short acting prescription I discussed I will call them with a decision about this when I hear back about Decadron.

## 2022-05-20 NOTE — Assessment & Plan Note (Signed)
Currently awaiting pathology results.  Dr. Marcello Moores was his neurosurgeon Dr. Mickeal Skinner is his neuro oncologist.  I will reach out to Dr. Mickeal Skinner to find out the timing of when he can come off Decadron.   ADDENDUM: Heard back from Dr Mickeal Skinner, will decrease decadron to '2mg'$  daily (half a tablet) until he follows up with Dr Mickeal Skinner to discuss path report.  I called son Eustacio and informed him of this plan.  Also encourage him to call me if sugars remain high (offered SSI if needed)

## 2022-05-20 NOTE — Patient Instructions (Signed)
I will call you after discussing if you can stop the decadron.

## 2022-05-20 NOTE — Progress Notes (Signed)
New Patient Office Visit  Subjective    Patient ID: Jorge [name], male    DOB: [birth year]  Age: 76 y.o. MRN: [id number]  CC:  Chief Complaint  Patient presents with  . Hospitalization Follow-up    New HFU     HPI Jorge [name] presents to establish care/ hospital follow up for brain mass.  He is accompanied today by his wife and son [name].  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  Neurosurgery, neurology and neuro-oncology were all consulted he was placed on Keppra and Decadron with improvement of his mental status.  He underwent brain biopsy with Dr. Marcello Moores on 8/29 and path results from that is still pending, wife notes that she recently called neurosurgery and the reported that it had been sent to [facility].  Since discharge she has remained on Decadron 4 mg daily he has also been on Lantus 25 units daily and a regimen of metformin 500 mg in the morning 1000 mg at night.  Sugars in the mornings are between 140s and 180s depending on what he eats it may go as high as 300 but usually not.    The used to check his blood pressure pretty frequently but have not as of late.  Overall they feel he is doing okay he has been less active and sitting around the house more but they have not pressured him to do much since his hospitalization.  Outpatient Encounter Medications as of 05/20/2022  Medication Sig  . amLODipine (NORVASC) 5 MG tablet Take 1 tablet (5 mg total) by mouth daily.  . blood glucose meter kit and supplies KIT Dispense based on patient and insurance preference. Use up to four times daily as directed.  Marland Kitchen dexamethasone (DECADRON) 4 MG tablet Take 1 tablet (4 mg total) by mouth daily.  . diphenhydrAMINE (BENADRYL) 25 MG tablet Take 25 mg by mouth every 6 (six) hours as needed for allergies.  Marland Kitchen escitalopram (LEXAPRO) 10 MG tablet Take 1 tablet (10 mg total) by mouth  daily.  Marland Kitchen HYDROcodone-acetaminophen (NORCO/VICODIN) 5-325 MG tablet Take 1 tablet by mouth every 6 (six) hours as needed for moderate pain.  Marland Kitchen insulin glargine (LANTUS) 100 UNIT/ML Solostar Pen Inject 25 Units into the skin daily.  . Insulin Pen Needle (PEN NEEDLES) 32G X 4 MM MISC 1 Needle by Does not apply route daily.  Marland Kitchen levETIRAcetam (KEPPRA) 500 MG tablet Take 1 tablet (500 mg total) by mouth 2 (two) times daily.  Marland Kitchen losartan (COZAAR) 100 MG tablet Take 1 tablet (100 mg total) by mouth daily.  . metFORMIN (GLUCOPHAGE-XR) 500 MG 24 hr tablet Take 1-2 tablets (500-1,000 mg total) by mouth See admin instructions. Take 500 mg with breakfast and 1000 mg with supper  . naproxen sodium (ALEVE) 220 MG tablet Take 220-440 mg by mouth daily as needed (pain).  . pantoprazole (PROTONIX) 40 MG tablet Take 1 tablet (40 mg total) by mouth daily as needed (acid reflux).  . simvastatin (ZOCOR) 40 MG tablet Take 1 tablet (40 mg total) by mouth at bedtime.  . [DISCONTINUED] amLODipine (NORVASC) 5 MG tablet Take 1 tablet (5 mg total) by mouth daily.  . [DISCONTINUED] busPIRone (BUSPAR) 5 MG tablet Take 5 mg by mouth 3 (three) times daily as needed (anxiety).  . [DISCONTINUED] escitalopram (LEXAPRO) 10 MG tablet Take 10 mg by mouth daily.  . [  DISCONTINUED] levETIRAcetam (KEPPRA) 500 MG tablet TAKE 1 TABLET BY MOUTH TWICE A DAY  . [DISCONTINUED] losartan (COZAAR) 100 MG tablet Take 100 mg by mouth daily.  . [DISCONTINUED] metFORMIN (GLUCOPHAGE-XR) 500 MG 24 hr tablet Take 500-1,000 mg by mouth See admin instructions. Take 500 mg with breakfast and 1000 mg with supper  . [DISCONTINUED] pantoprazole (PROTONIX) 40 MG tablet Take 40 mg by mouth daily as needed (acid reflux).  . [DISCONTINUED] simvastatin (ZOCOR) 20 MG tablet Take 2 tablets (40 mg total) by mouth at bedtime. (Patient taking differently: Take 20 mg by mouth at bedtime.)   No facility-administered encounter medications on file as of 05/20/2022.    Past  Medical History:  Diagnosis Date  . Anemia   . Anxiety   . Chronic kidney disease    Stage 3  . COVID 08/2021   very mild  . Diabetes mellitus without complication ([place])   . Environmental allergies    Hx: of  . GERD (gastroesophageal reflux disease)   . Hypertension   . Numbness and tingling    Hx; of  Right shoulder    Past Surgical History:  Procedure Laterality Date  . ANTERIOR CERVICAL DECOMP/DISCECTOMY FUSION N/A 03/17/2013   Procedure: ANTERIOR CERVICAL DECOMPRESSION/DISCECTOMY FUSION 2 LEVEL C5-7;  Surgeon: [name], MD;  Location: [facility];  Service: Orthopedics;  Laterality: N/A;  . APPLICATION OF CRANIAL NAVIGATION Left 05/06/2022   Procedure: APPLICATION OF CRANIAL NAVIGATION VARIO GUIDE;  Surgeon: Vallarie Mare, MD;  Location: [facility];  Service: Neurosurgery;  Laterality: Left;  . COLONOSCOPY     Hx: of  . FRACTURE SURGERY Right    knee  . FRAMELESS  BIOPSY WITH BRAINLAB Left 05/06/2022   Procedure: STEREOTACTIC BRAIN BIOPSY;  Surgeon: Vallarie Mare, MD;  Location: [facility];  Service: Neurosurgery;  Laterality: Left;  . HERNIA REPAIR    . TONSILLECTOMY      Family History  Problem Relation Age of Onset  . Diabetes Brother     Social History   Socioeconomic History  . Marital status: Married    Spouse name: Not on file  . Number of children: Not on file  . Years of education: Not on file  . Highest education level: Not on file  Occupational History  . Not on file  Tobacco Use  . Smoking status: Former    Types: Cigars  . Smokeless tobacco: Never  Vaping Use  . Vaping Use: Never used  Substance and Sexual Activity  . Alcohol use: No  . Drug use: No  . Sexual activity: Not on file  Other Topics Concern  . Not on file  Social History Narrative  . Not on file   Social Determinants of Health   Financial Resource Strain: Not on file  Food Insecurity: Not on file  Transportation Needs: Not on file  Physical Activity: Not on file  Stress:  Not on file  Social Connections: Not on file  Intimate Partner Violence: Not on file    Review of Systems  Constitutional:  Positive for weight loss. Negative for fever.  Gastrointestinal:  Negative for nausea and vomiting.  Neurological:  Negative for dizziness, weakness and headaches.        Objective    BP (!) 152/67 (BP Location: Left Arm, Patient Position: Sitting, Cuff Size: Normal)   Pulse 69   Ht 5' 11"  (1.803 m)   Wt 188 lb 3.2 oz (85.4 kg)   SpO2 100%   BMI 26.25 kg/m  Physical Exam Vitals and nursing note reviewed.  Constitutional:      Appearance: Normal appearance.     Comments: In transport wheelchair  HENT:     Head:     Comments: Left temporal surgical scar, small amount of swelling Cardiovascular:     Rate and Rhythm: Normal rate and regular rhythm.  Pulmonary:     Effort: Pulmonary effort is normal.     Breath sounds: Normal breath sounds. No wheezing.  Neurological:     Mental Status: He is alert.     Motor: No weakness (gross strength intake bilateral upper extremitiy and grip, bilateral hip flexion and knee extension.).    Last CBC Lab Results  Component Value Date   WBC 10.0 04/29/2022   HGB 16.3 04/29/2022   HGB 15.6 04/29/2022   HCT 48.0 04/29/2022   HCT 44.6 04/29/2022   MCV 89.9 04/29/2022   MCH 31.5 04/29/2022   RDW 12.2 04/29/2022   PLT 191 69/62/9528   Last metabolic panel Lab Results  Component Value Date   GLUCOSE 182 (H) 05/04/2022   NA 135 05/04/2022   K 4.0 05/04/2022   CL 103 05/04/2022   CO2 28 05/04/2022   BUN 26 (H) 05/04/2022   CREATININE 1.26 (H) 05/04/2022   GFRNONAA 59 (L) 05/04/2022   CALCIUM 8.8 (L) 05/04/2022   PROT 7.3 04/29/2022   ALBUMIN 4.1 04/29/2022   BILITOT 1.1 04/29/2022   ALKPHOS 81 04/29/2022   AST 15 04/29/2022   ALT 17 04/29/2022   ANIONGAP 4 (L) 05/04/2022   Last hemoglobin A1c Lab Results  Component Value Date   HGBA1C 7.6 (H) 04/29/2022   Last thyroid functions No results  found for: "TSH", "T3TOTAL", "T4TOTAL", "THYROIDAB" Last vitamin D No results found for: "25OHVITD2", "25OHVITD3", "VD25OH"      Assessment & Plan:   Problem List Items Addressed This Visit       Cardiovascular and Mediastinum   Essential hypertension    Currently taking losartan 100 mg daily and amlodipine 5 mg daily.  His blood pressure is mildly elevated at 152/67 today.  He was previously on carvedilol.  I will have them continue the current medications of losartan and amlodipine start checking home readings daily.  We will see if we get off Decadron plan to follow-up in 4 weeks.      Relevant Medications   amLODipine (NORVASC) 5 MG tablet   losartan (COZAAR) 100 MG tablet   simvastatin (ZOCOR) 40 MG tablet     Endocrine   Type 2 diabetes mellitus with stage 3b chronic kidney disease, without long-term current use of insulin (HCC) - Primary    Currently on Lantus 25 units with metformin 1500 mg daily total.  Likely experiencing some postprandial hyperglycemia.  We will see if he can come off Decadron which will help this otherwise may need short acting prescription I discussed I will call them with a decision about this when I hear back about Decadron.      Relevant Medications   losartan (COZAAR) 100 MG tablet   simvastatin (ZOCOR) 40 MG tablet   metFORMIN (GLUCOPHAGE-XR) 500 MG 24 hr tablet     Nervous and Auditory   Seizure disorder (HCC)    We will plan to keep him on Keppra 500 mg twice daily for now.      Relevant Medications   levETIRAcetam (KEPPRA) 500 MG tablet   Brain tumor [facility])    Currently awaiting pathology results.  Dr. Marcello Moores was his neurosurgeon  Dr. Mickeal Skinner is his neuro oncologist.  I will reach out to Dr. Mickeal Skinner to find out the timing of when he can come off Decadron.        Other   Status post stereotactic brain biopsy    Return in about 4 weeks (around 06/17/2022).   Lucious Groves, DO

## 2022-05-21 MED ORDER — DEXAMETHASONE 4 MG PO TABS: 2.0000 mg | ORAL_TABLET | Freq: Every day | ORAL | 0 refills | Status: DC

## 2022-05-21 NOTE — Addendum Note (Signed)
Addended by: Lucious Groves on: 05/21/2022 08:59 AM   Modules accepted: Orders

## 2022-05-22 ENCOUNTER — Encounter (HOSPITAL_COMMUNITY): Payer: Self-pay

## 2022-05-26 ENCOUNTER — Inpatient Hospital Stay: Payer: Medicare Other

## 2022-05-26 ENCOUNTER — Inpatient Hospital Stay: Payer: Medicare Other | Attending: Internal Medicine | Admitting: Internal Medicine

## 2022-05-26 ENCOUNTER — Encounter: Payer: Self-pay | Admitting: Internal Medicine

## 2022-05-26 ENCOUNTER — Other Ambulatory Visit: Payer: Self-pay | Admitting: Student

## 2022-05-26 ENCOUNTER — Other Ambulatory Visit: Payer: Self-pay

## 2022-05-26 VITALS — BP 158/76 | HR 84 | Temp 97.6°F | Resp 16 | Ht 71.0 in | Wt 187.3 lb

## 2022-05-26 DIAGNOSIS — G9389 Other specified disorders of brain: Secondary | ICD-10-CM | POA: Diagnosis not present

## 2022-05-26 DIAGNOSIS — Z833 Family history of diabetes mellitus: Secondary | ICD-10-CM | POA: Insufficient documentation

## 2022-05-26 DIAGNOSIS — E1122 Type 2 diabetes mellitus with diabetic chronic kidney disease: Secondary | ICD-10-CM | POA: Insufficient documentation

## 2022-05-26 DIAGNOSIS — Z87891 Personal history of nicotine dependence: Secondary | ICD-10-CM | POA: Diagnosis not present

## 2022-05-26 DIAGNOSIS — Z8616 Personal history of COVID-19: Secondary | ICD-10-CM | POA: Diagnosis not present

## 2022-05-26 DIAGNOSIS — N1832 Chronic kidney disease, stage 3b: Secondary | ICD-10-CM | POA: Insufficient documentation

## 2022-05-26 DIAGNOSIS — N3289 Other specified disorders of bladder: Secondary | ICD-10-CM | POA: Diagnosis not present

## 2022-05-26 DIAGNOSIS — D496 Neoplasm of unspecified behavior of brain: Secondary | ICD-10-CM

## 2022-05-26 DIAGNOSIS — I129 Hypertensive chronic kidney disease with stage 1 through stage 4 chronic kidney disease, or unspecified chronic kidney disease: Secondary | ICD-10-CM | POA: Insufficient documentation

## 2022-05-26 DIAGNOSIS — Z8673 Personal history of transient ischemic attack (TIA), and cerebral infarction without residual deficits: Secondary | ICD-10-CM | POA: Insufficient documentation

## 2022-05-26 DIAGNOSIS — J320 Chronic maxillary sinusitis: Secondary | ICD-10-CM | POA: Insufficient documentation

## 2022-05-26 DIAGNOSIS — K760 Fatty (change of) liver, not elsewhere classified: Secondary | ICD-10-CM | POA: Diagnosis not present

## 2022-05-26 DIAGNOSIS — Z7952 Long term (current) use of systemic steroids: Secondary | ICD-10-CM | POA: Insufficient documentation

## 2022-05-26 DIAGNOSIS — G40909 Epilepsy, unspecified, not intractable, without status epilepticus: Secondary | ICD-10-CM | POA: Insufficient documentation

## 2022-05-26 DIAGNOSIS — R4701 Aphasia: Secondary | ICD-10-CM | POA: Insufficient documentation

## 2022-05-26 DIAGNOSIS — Z79899 Other long term (current) drug therapy: Secondary | ICD-10-CM | POA: Insufficient documentation

## 2022-05-26 LAB — SEDIMENTATION RATE: Sed Rate: 1 mm/hr (ref 0–16)

## 2022-05-26 LAB — C-REACTIVE PROTEIN: CRP: 0.7 mg/dL (ref <1.0)

## 2022-05-26 NOTE — Progress Notes (Signed)
[facility], [place] [address] [phone]   New Patient Evaluation  Date of Service: 05/26/22 Patient Name: Jorge [name] Patient MRN: [id number] Patient DOB: 03/03/1946 Provider: Ventura Sellers, MD  Identifying Statement:  Jorge [name] is a 76 y.o. male with  multifocal   mass  who presents for initial consultation and evaluation.    Referring Provider: Gweneth Fritter, [place] [place],  [place] [address]  Oncologic History: 05/06/22: Stereotactic biopsy with Dr. Marcello Moores; path is indeterminate.     Biomarkers:  MGMT Unknown.  IDH 1/2 Unknown.  EGFR Unknown  TERT Unknown   History of Present Illness: The patient's records from the referring physician were obtained and reviewed and the patient interviewed to confirm this HPI.  Jorge [name] presented in August 2023 with several weeks of episodes of several minutes of staring, followed by confusion.  First event actually dates back to 1 year ago, but frequency had increased considerably recently.  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  He underwent biopsy with Dr. Marcello Moores on 05/06/22, was discharged on Keppra and decadron 101m daily, since weaned down to 266mdaily.  Since discharge, he has returned essentially to his prior baseline.  He performs many ADL's independently, but has difficulty with memory that is limiting.  Gait is independent.  No further seizures since starting the Keppra.      Medications: Current Outpatient Medications on File Prior to Visit  Medication Sig Dispense Refill   amLODipine (NORVASC) 5 MG tablet Take 1 tablet (5 mg total) by mouth daily. 90 tablet 1   blood glucose meter kit and supplies KIT Dispense based on patient and insurance preference. Use up to four times daily as directed. 1 each 0   dexamethasone (DECADRON) 4 MG tablet Take 0.5 tablets (2 mg total) by mouth daily. 30 tablet 0   diphenhydrAMINE  (BENADRYL) 25 MG tablet Take 25 mg by mouth every 6 (six) hours as needed for allergies.     escitalopram (LEXAPRO) 10 MG tablet Take 1 tablet (10 mg total) by mouth daily. 90 tablet 1   insulin glargine (LANTUS) 100 UNIT/ML Solostar Pen Inject 25 Units into the skin daily. 15 mL 1   Insulin Pen Needle (PEN NEEDLES) 32G X 4 MM MISC 1 Needle by Does not apply route daily. 30 each 2   levETIRAcetam (KEPPRA) 500 MG tablet Take 1 tablet (500 mg total) by mouth 2 (two) times daily. 180 tablet 0   losartan (COZAAR) 100 MG tablet Take 1 tablet (100 mg total) by mouth daily. 90 tablet 1   metFORMIN (GLUCOPHAGE-XR) 500 MG 24 hr tablet Take 1-2 tablets (500-1,000 mg total) by mouth See admin instructions. Take 500 mg with breakfast and 1000 mg with supper 270 tablet 1   naproxen sodium (ALEVE) 220 MG tablet Take 220-440 mg by mouth daily as needed (pain).     pantoprazole (PROTONIX) 40 MG tablet Take 1 tablet (40 mg total) by mouth daily as needed (acid reflux). 90 tablet 0   simvastatin (ZOCOR) 40 MG tablet Take 1 tablet (40 mg total) by mouth at bedtime. 90 tablet 1   No current facility-administered medications on file prior to visit.    Allergies: No Known Allergies Past Medical History:  Past Medical History:  Diagnosis Date   Anemia    Anxiety    Chronic kidney disease    Stage 3  COVID 08/2021   very mild   Diabetes mellitus without complication (HCC)    Environmental allergies    Hx: of   GERD (gastroesophageal reflux disease)    Hypertension    Numbness and tingling    Hx; of  Right shoulder   Past Surgical History:  Past Surgical History:  Procedure Laterality Date   ANTERIOR CERVICAL DECOMP/DISCECTOMY FUSION N/A 03/17/2013   Procedure: ANTERIOR CERVICAL DECOMPRESSION/DISCECTOMY FUSION 2 LEVEL C5-7;  Surgeon: Venita Lick, MD;  Location: [facility] OR;  Service: Orthopedics;  Laterality: N/A;   APPLICATION OF CRANIAL NAVIGATION Left 05/06/2022   Procedure: APPLICATION OF CRANIAL  NAVIGATION VARIO GUIDE;  Surgeon: Bedelia Person, MD;  Location: [facility] OR;  Service: Neurosurgery;  Laterality: Left;   COLONOSCOPY     Hx: of   FRACTURE SURGERY Right    knee   FRAMELESS  BIOPSY WITH BRAINLAB Left 05/06/2022   Procedure: STEREOTACTIC BRAIN BIOPSY;  Surgeon: Bedelia Person, MD;  Location: [facility] OR;  Service: Neurosurgery;  Laterality: Left;   HERNIA REPAIR     TONSILLECTOMY     Social History:  Social History   Socioeconomic History   Marital status: Married    Spouse name: Not on file   Number of children: Not on file   Years of education: Not on file   Highest education level: Not on file  Occupational History   Not on file  Tobacco Use   Smoking status: Former    Types: Cigars   Smokeless tobacco: Never  Vaping Use   Vaping Use: Never used  Substance and Sexual Activity   Alcohol use: No   Drug use: No   Sexual activity: Not on file  Other Topics Concern   Not on file  Social History Narrative   Not on file   Social Determinants of Health   Financial Resource Strain: Not on file  Food Insecurity: Not on file  Transportation Needs: Not on file  Physical Activity: Not on file  Stress: Not on file  Social Connections: Not on file  Intimate Partner Violence: Not on file   Family History:  Family History  Problem Relation Age of Onset   Diabetes Brother     Review of Systems: Constitutional: Doesn't report fevers, chills or abnormal weight loss Eyes: Doesn't report blurriness of vision Ears, nose, mouth, throat, and face: Doesn't report sore throat Respiratory: Doesn't report cough, dyspnea or wheezes Cardiovascular: Doesn't report palpitation, chest discomfort  Gastrointestinal:  Doesn't report nausea, constipation, diarrhea GU: Doesn't report incontinence Skin: Doesn't report skin rashes Neurological: Per HPI Musculoskeletal: Doesn't report joint pain Behavioral/Psych: Doesn't report anxiety  Physical Exam: Vitals:   05/26/22 0914   BP: (!) 158/76  Pulse: 84  Resp: 16  Temp: 97.6 F (36.4 C)  SpO2: 100%   KPS: 70. General: Alert, cooperative, pleasant, in no acute distress Head: Normal EENT: No conjunctival injection or scleral icterus.  Lungs: Resp effort normal Cardiac: Regular rate Abdomen: Non-distended abdomen Skin: No rashes cyanosis or petechiae. Extremities: No clubbing or edema  Neurologic Exam: Mental Status: Awake, alert, attentive to examiner. Oriented to self and environment. Language is fluent with intact comprehension.  Noted psychomotor slowing and impaired recall. Cranial Nerves: Visual acuity is grossly normal. Visual fields are full. Extra-ocular movements intact. No ptosis. Face is symmetric Motor: Tone and bulk are normal. Power is full in both arms and legs. Reflexes are symmetric, no pathologic reflexes present.  Sensory: Intact to light touch Gait: Normal.  Labs: I have reviewed the data as listed    Component Value Date/Time   NA 135 05/04/2022 0700   K 4.0 05/04/2022 0700   CL 103 05/04/2022 0700   CO2 28 05/04/2022 0700   GLUCOSE 182 (H) 05/04/2022 0700   BUN 26 (H) 05/04/2022 0700   CREATININE 1.26 (H) 05/04/2022 0700   CALCIUM 8.8 (L) 05/04/2022 0700   PROT 7.3 04/29/2022 1311   ALBUMIN 4.1 04/29/2022 1311   AST 15 04/29/2022 1311   ALT 17 04/29/2022 1311   ALKPHOS 81 04/29/2022 1311   BILITOT 1.1 04/29/2022 1311   GFRNONAA 59 (L) 05/04/2022 0700   GFRAA >60 11/20/2017 0353   Lab Results  Component Value Date   WBC 10.0 04/29/2022   NEUTROABS 7.8 (H) 04/29/2022   HGB 16.3 04/29/2022   HGB 15.6 04/29/2022   HCT 48.0 04/29/2022   HCT 44.6 04/29/2022   MCV 89.9 04/29/2022   PLT 191 04/29/2022    Imaging: CT HEAD WO CONTRAST  Result Date: 05/07/2022 CLINICAL DATA:  Postop from craniotomy EXAM: CT HEAD WITHOUT CONTRAST TECHNIQUE: Contiguous axial images were obtained from the base of the skull through the vertex without intravenous contrast. RADIATION DOSE  REDUCTION: This exam was performed according to the departmental dose-optimization program which includes automated exposure control, adjustment of the mA and/or kV according to patient size and/or use of iterative reconstruction technique. COMPARISON:  CT head 04/29/2022, brain MRI 05/01/2022 FINDINGS: Brain: There has been interval left temporal burr hole for brain biopsy. There is a small amount of pneumocephalus but no evidence of intracranial hemorrhage or other complicating feature. Extensive low-density in the bilateral insula and temporal lobes, left worse than right, is unchanged with stable associated mass effect without midline shift. The ventricles are stable in size. There is no evidence of acute territorial infarct. Vascular: No hyperdense vessel or unexpected calcification. Skull: Postsurgical changes reflecting left temporal burr hole as above. Sinuses/Orbits: Chronic right maxillary sinusitis is unchanged. Bilateral lens implants are in place. The globes and orbits are otherwise unremarkable. Other: A small amount of postoperative soft tissue gas in the left scalp extending to the left masticator [name] is noted. IMPRESSION: Status post left temporal burr hole for mass biopsy without evidence of complicating feature. Electronically Signed   By: Valetta Mole M.D.   On: 05/07/2022 08:22   US RENAL  Result Date: 05/03/2022 CLINICAL DATA:  Acute kidney injury EXAM: RENAL / URINARY TRACT ULTRASOUND COMPLETE COMPARISON:  None Available. FINDINGS: Right Kidney: Renal measurements: 12.3 x 6.3 x 7.2 cm = volume: 293 mL. Echogenicity within normal limits. No mass or hydronephrosis visualized. Left Kidney: Renal measurements: 12.6 x 5.9 x 5.4 cm = volume: 207 mL. Echogenicity within normal limits. No mass or hydronephrosis visualized. Complex cyst of the upper pole of the left kidney measuring 1.5 x 1.3 x 1.4 cm wall appears irregular with possible internal debris or nodularity. Bladder: Appears normal for  degree of bladder distention. Other: Hepatic steatosis. IMPRESSION: 1. No hydronephrosis. 2. Complex cyst of the upper pole of the left kidney, recommend renal protocol MRI for further evaluation. This can be performed non emergently. 3. Hepatic steatosis. Electronically Signed   By: Yetta Glassman M.D.   On: 05/03/2022 18:06   MR BRAIN W WO CONTRAST  Result Date: 05/01/2022 CLINICAL DATA:  Brain/CNS neoplasm, staging. EXAM: MRI HEAD WITHOUT AND WITH CONTRAST TECHNIQUE: Multiplanar, multiecho pulse sequences of the brain and surrounding structures were obtained without and with intravenous contrast. CONTRAST:  9.42mL GADAVIST GADOBUTROL 1 MMOL/ML IV SOLN COMPARISON:  04/29/2022 FINDINGS: Nonenhancing, masslike hyperintense T2-weighted signal in the left greater than right insula and temporal lobes is unchanged. Thin-section imaging was obtained as part of preoperative protocol. 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 Electronically Signed   By: Ulyses Jarred M.D.   On: 05/01/2022 20:30   EEG adult  Result Date: 04/30/2022 Lora Havens, MD     04/30/2022  4:33 PM Patient Name: Nehan Flaum MRN: [id number] Epilepsy Attending: Lora Havens Referring Physician/Provider: Janine Ores, NP Date: 04/30/2022 Duration: 21.06 mins Patient history:  76 y.o. male with a past medical history of diabetes, GERD, HTN, TIA and numbness and tingling of his right shoulder presenting after an episode of confusion and "staring off into [name]" while at a restaurant with his wife. EEG to evaluate for seizure. Level of alertness: Awake AEDs during EEG study: LEV Technical aspects: This EEG study was done with scalp electrodes positioned according to the 10-20 International system of electrode placement. Electrical activity was reviewed with band pass filter of 1-$RemoveBef'70Hz'PAeqhPCLje$ , sensitivity of 7 uV/mm, display speed of 55mm/sec with a $Remo'60Hz'ApdQg$  notched filter applied as  appropriate. EEG data were recorded continuously and digitally stored.  Video monitoring was available and reviewed as appropriate. Description: No clear posterior dominant rhythm was seen. EEG showed continuous generalized 3 to 6 Hz theta-delta slowing. Hyperventilation and photic stimulation were not performed.   Of note, study was technically difficult due to significant myogenic and electrode artifact. ABNORMALITY - Continuous slow, generalized IMPRESSION: This technically difficult study is suggestive of moderate diffuse encephalopathy, nonspecific etiology. No seizures or epileptiform discharges were seen throughout the recording. A normal interictal EEG does not exclude nor support the diagnosis of epilepsy. If suspicion for interictal activity remains a concern, a prolonged study can be considered. Lora Havens   MR BRAIN W WO CONTRAST  Result Date: 04/29/2022 CLINICAL DATA:  Altered mental status EXAM: MRI HEAD WITHOUT AND WITH CONTRAST TECHNIQUE: Multiplanar, multiecho pulse sequences of the brain and surrounding structures were obtained without and with intravenous contrast. CONTRAST:  78mL GADAVIST GADOBUTROL 1 MMOL/ML IV SOLN COMPARISON:  11/19/2017 FINDINGS: Brain: There is masslike hyperintense T2-weighted signal involving the left-greater-than-right insula and temporal lobes, crossing the midline at the anterior commissure. There is no contrast enhancement. There is mild mass effect on the left lateral ventricle. No abnormal diffusion restriction. No hemorrhage. Vascular: Normal flow voids Skull and upper cervical spine: Normal marrow signal. Sinuses/Orbits: Chronic right maxillary sinusitis.  Normal orbits. Other: None IMPRESSION: 1. Masslike hyperintense T2-weighted signal involving the left-greater-than-right insula and temporal lobes. 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 Electronically Signed   By: Ulyses Jarred M.D.   On: 04/29/2022 20:04   CT HEAD WO  CONTRAST  Result Date: 04/29/2022 CLINICAL DATA:  Acute neuro deficit. Aphasia. Severe hypertension today. EXAM: CT HEAD WITHOUT CONTRAST TECHNIQUE: Contiguous axial images were obtained from the base of the skull through the vertex without intravenous contrast. RADIATION DOSE REDUCTION: This exam was performed according to the departmental dose-optimization program which includes automated exposure control, adjustment of the mA and/or kV according to patient size and/or use of iterative reconstruction technique. COMPARISON:  CT head 11/24/2017.  MRI head 11/19/2017 FINDINGS: Brain: Extensive low-density edema is present bilaterally, left greater than right. This is centered in the left insula but extends into the left basal ganglia and left frontal and temporal lobe. This shows significant progression  compared with 2019 MRI. There is also edema in the deep white matter tracks on the right extending into the right insula. Ventricle size normal. Mild flattening of the left lateral ventricle due to edema. No acute hemorrhage. Vascular: Middle cerebral arteries shows relative hyperdensity diffusely and bilaterally. This is likely due to surrounding brain edema. Skull: Negative Sinuses/Orbits: Complete opacification right maxillary sinus. Mucosal edema right frontal and ethmoid sinus. Bilateral cataract extraction Other: None IMPRESSION: Extensive low-density edema centered in the insula bilaterally, left greater than right. This was present on MRI in 2019 but shows significant progression. There is local mass-effect flattening left lateral ventricle. No acute hemorrhage. There is relative hyperdensity of the MCA bilaterally which is likely accentuated weighted due to surrounding edema in the brain rather than arterial thrombosis. These nonspecific features could be seen with encephalitis. Consider herpes. Tumor is a consideration however the edema is not particularly masslike and was present 4 years ago although with  significant progression. Also consider PRES given the history of severe hypertension today These results were called by telephone at the time of interpretation on 04/29/2022 at 2:28 pm to provider Nechama Guard, who verbally acknowledged these results. Electronically Signed   By: Franchot Gallo M.D.   On: 04/29/2022 14:30    Pathology: SURGICAL PATHOLOGY  CASE: [id number]  PATIENT: [facility]  Surgical Pathology Report    Clinical History: brain mass (cm)    FINAL MICROSCOPIC DIAGNOSIS:   A.  B. BRAIN, LEFT TEMPORAL LOBE, BIOPSY:  -  Brain with patchy inflammation as well as mildly increased cellular  and glial atypia.   Note: The specimen was sent to [facility] for expert consultation  and the above diagnosis reflects Dr. Moises Blood expert opinion.  Please see his accompanying report which has been scanned and has a long  descriptive note with an overall summary that the findings are  nonspecific and that the findings could be consistent with an  inflammatory etiology, although features of specific infectious,  vasculitis or other inflammatory disease are not evident.  He also  cannot exclude the possibility of a very low cellularity diffuse glioma  but without clear evidence of neoplasm.  In addition the observed  changes could also be consistent with mild reactive gliosis and neuro  inflammation at the edge of the lesion not present in the current  material.  He does mention that NGS could be performed at their  institution or elsewhere to search for driver mutations that would be  characteristic of a glioma with the caveat that this very low lesional  cellularity may be technically challenging.    Assessment/Plan Brain tumor [facility])  Seizure disorder [facility])  We appreciate the opportunity to participate in the care of Kaleen Odea.  He presents with clinical and radiographic syndrome localizing to both cerebral hemispheres, mesial temporal lobes.  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  Patient is stabilized clinically, with noted anterograde amnesia.  His case was reviewed in detail in brain/spine tumor board this morning.  We recommended more detailed analysis of tumor specimen with next-generation sequencing.  If tumor specific mutation is identified, this could by itself complete diagnostic puzzle.  Tissue will be sent out for sequencing to [facility].  Consideration will also be given to inflammatory etiologies, although imaging appearance is far more c/w with neoplasm, especially when including MRI study from 2019.  We will check serum for inflammatory markers and continue corticosteroids.    MRI brain will be repeated in  4-6 weeks.  Keppra will be continued at 575m BID, decadron at 281mdaily.    Screening for potential clinical trials was performed and discussed using eligibility criteria for active protocols at [facility], as well as national database available on [email or web address]   The patient is not a candidate for a research protocol at this time due to no suitable study identified.   We spent twenty additional minutes teaching regarding the natural history, biology, and historical experience in the treatment of brain tumors. We then discussed in detail the current recommendations for therapy focusing on the mode of administration, mechanism of action, anticipated toxicities, and quality of life issues associated with this plan. We also provided teaching sheets for the patient to take home as an additional resource.  ToRutherford Alarieill return to clinic in 4-6 weeks with results of sequencing, labs and updated brain MRI.  He and his family are agreeable with this plan.  All questions were answered. The patient knows to call the clinic with any problems, questions or concerns. No barriers to learning were detected.  The total time spent in the encounter was 60 minutes and more than 50%  was on counseling and review of test results   ZaVentura SellersMD Medical Director of Neuro-Oncology [facility] [place]9/18/23 9:22 AM

## 2022-05-27 LAB — C3 AND C4
C3 Complement: 114 mg/dL (ref 82–167)
Complement C4, Body Fluid: 22 mg/dL (ref 12–38)

## 2022-05-27 LAB — ANTI-DNA ANTIBODY, DOUBLE-STRANDED: ds DNA Ab: <1 IU/mL (ref 0–9)

## 2022-05-28 ENCOUNTER — Telehealth: Payer: Self-pay

## 2022-05-28 ENCOUNTER — Encounter (HOSPITAL_COMMUNITY): Payer: Self-pay | Admitting: Neurosurgery

## 2022-05-28 LAB — ANCA TITERS
Atypical P-ANCA titer: <1:20 {titer}
C-ANCA: <1:20 {titer}
P-ANCA: <1:20 {titer}

## 2022-05-28 MED ORDER — INSULIN LISPRO (1 UNIT DIAL) 100 UNIT/ML (KWIKPEN): 5.0000 [IU] | PEN_INJECTOR | Freq: Three times a day (TID) | SUBCUTANEOUS | 11 refills | Status: DC

## 2022-05-28 NOTE — Telephone Encounter (Signed)
Pt's wife requesting the nurse to call her son Plotts @ [phone]), wife states husband blood sugar has been around 300. Please call the son back.

## 2022-05-28 NOTE — Telephone Encounter (Signed)
05/20/22 OV states to call PCP if blood sugars remain high.

## 2022-05-28 NOTE — Telephone Encounter (Signed)
Reviewed dr Renda Rolls note and talked with son, sugars still running high towards end of day, sometimes up to 300s.  We talked about adding short acting insulin.  Will need to stay on a dose of decadron.  Will start Humalog 5 units with meals, use additional 1 unit for correction factor of 50.  They are very familiar with correction factor with a T1DM daughter.

## 2022-05-29 ENCOUNTER — Other Ambulatory Visit: Payer: Self-pay | Admitting: Radiation Therapy

## 2022-05-29 ENCOUNTER — Telehealth: Payer: Self-pay | Admitting: *Deleted

## 2022-05-29 LAB — ANTINUCLEAR ANTIBODIES, IFA: ANA Ab, IFA: NEGATIVE

## 2022-05-29 NOTE — Telephone Encounter (Signed)
Processed [facility] requisition via fax

## 2022-05-30 ENCOUNTER — Encounter: Payer: Self-pay | Admitting: Internal Medicine

## 2022-05-30 ENCOUNTER — Telehealth: Payer: Self-pay

## 2022-05-30 NOTE — Telephone Encounter (Signed)
Called pt's wife to relay message from Dr. Mickeal Skinner. Patient to take 500 mg of Keppra now and then 1,000 mg BID this evening. He will start 1,000 mg BIS in the morning. She understands that if her husband does not improve over the weekend, Dr. Mickeal Skinner will see him early next week.

## 2022-06-09 ENCOUNTER — Encounter: Payer: Self-pay | Admitting: Internal Medicine

## 2022-06-10 ENCOUNTER — Other Ambulatory Visit: Payer: Self-pay | Admitting: Internal Medicine

## 2022-06-10 MED ORDER — LACOSAMIDE 100 MG PO TABS: 100.0000 mg | ORAL_TABLET | Freq: Two times a day (BID) | ORAL | 1 refills | Status: DC

## 2022-06-10 MED ORDER — DEXAMETHASONE 4 MG PO TABS: 2.0000 mg | ORAL_TABLET | Freq: Every day | ORAL | 0 refills | Status: DC

## 2022-06-10 NOTE — Progress Notes (Signed)
Spoke with patient's son, Petro.  He is continuing to experience seizures despite increased dose of Keppra '1000mg'$  BID.    We recommended the following: -Start Vimpat '100mg'$  BID -Increase decadron to '4mg'$  daiy x7 days, then reduce back to '2mg'$  daily  We will follow up later this month after MRI, sequencing data result.  Ventura Sellers, MD

## 2022-06-23 ENCOUNTER — Emergency Department (HOSPITAL_COMMUNITY): Payer: Medicare Other

## 2022-06-23 ENCOUNTER — Encounter (HOSPITAL_COMMUNITY): Payer: Self-pay | Admitting: Emergency Medicine

## 2022-06-23 ENCOUNTER — Inpatient Hospital Stay (HOSPITAL_COMMUNITY): Payer: Medicare Other

## 2022-06-23 ENCOUNTER — Inpatient Hospital Stay (HOSPITAL_COMMUNITY)
Admission: EM | Admit: 2022-06-23 | Discharge: 2022-06-26 | DRG: 101 | Disposition: A | Discharge status: Other Acute Inpatient Hospital | Payer: Medicare Other | Attending: Internal Medicine | Admitting: Internal Medicine

## 2022-06-23 DIAGNOSIS — D496 Neoplasm of unspecified behavior of brain: Secondary | ICD-10-CM | POA: Diagnosis present

## 2022-06-23 DIAGNOSIS — Y92009 Unspecified place in unspecified non-institutional (private) residence as the place of occurrence of the external cause: Secondary | ICD-10-CM

## 2022-06-23 DIAGNOSIS — R4701 Aphasia: Secondary | ICD-10-CM | POA: Diagnosis present

## 2022-06-23 DIAGNOSIS — K219 Gastro-esophageal reflux disease without esophagitis: Secondary | ICD-10-CM | POA: Diagnosis present

## 2022-06-23 DIAGNOSIS — W010XXA Fall on same level from slipping, tripping and stumbling without subsequent striking against object, initial encounter: Secondary | ICD-10-CM | POA: Diagnosis present

## 2022-06-23 DIAGNOSIS — G40909 Epilepsy, unspecified, not intractable, without status epilepticus: Principal | ICD-10-CM | POA: Diagnosis present

## 2022-06-23 DIAGNOSIS — Z79899 Other long term (current) drug therapy: Secondary | ICD-10-CM

## 2022-06-23 DIAGNOSIS — Y92017 Garden or yard in single-family (private) house as the place of occurrence of the external cause: Secondary | ICD-10-CM | POA: Diagnosis not present

## 2022-06-23 DIAGNOSIS — R569 Unspecified convulsions: Secondary | ICD-10-CM

## 2022-06-23 DIAGNOSIS — N1832 Chronic kidney disease, stage 3b: Secondary | ICD-10-CM | POA: Diagnosis present

## 2022-06-23 DIAGNOSIS — Z8616 Personal history of COVID-19: Secondary | ICD-10-CM

## 2022-06-23 DIAGNOSIS — E1165 Type 2 diabetes mellitus with hyperglycemia: Secondary | ICD-10-CM | POA: Diagnosis present

## 2022-06-23 DIAGNOSIS — I129 Hypertensive chronic kidney disease with stage 1 through stage 4 chronic kidney disease, or unspecified chronic kidney disease: Secondary | ICD-10-CM | POA: Diagnosis present

## 2022-06-23 DIAGNOSIS — F39 Unspecified mood [affective] disorder: Secondary | ICD-10-CM | POA: Diagnosis present

## 2022-06-23 DIAGNOSIS — Z87891 Personal history of nicotine dependence: Secondary | ICD-10-CM | POA: Diagnosis not present

## 2022-06-23 DIAGNOSIS — Z981 Arthrodesis status: Secondary | ICD-10-CM | POA: Diagnosis not present

## 2022-06-23 DIAGNOSIS — S0081XA Abrasion of other part of head, initial encounter: Secondary | ICD-10-CM | POA: Diagnosis not present

## 2022-06-23 DIAGNOSIS — G8384 Todd's paralysis (postepileptic): Secondary | ICD-10-CM | POA: Diagnosis present

## 2022-06-23 DIAGNOSIS — Z833 Family history of diabetes mellitus: Secondary | ICD-10-CM | POA: Diagnosis not present

## 2022-06-23 DIAGNOSIS — E1122 Type 2 diabetes mellitus with diabetic chronic kidney disease: Secondary | ICD-10-CM | POA: Diagnosis present

## 2022-06-23 DIAGNOSIS — N1831 Chronic kidney disease, stage 3a: Secondary | ICD-10-CM | POA: Diagnosis present

## 2022-06-23 DIAGNOSIS — W19XXXA Unspecified fall, initial encounter: Principal | ICD-10-CM

## 2022-06-23 DIAGNOSIS — C719 Malignant neoplasm of brain, unspecified: Secondary | ICD-10-CM | POA: Diagnosis present

## 2022-06-23 DIAGNOSIS — E785 Hyperlipidemia, unspecified: Secondary | ICD-10-CM | POA: Diagnosis present

## 2022-06-23 DIAGNOSIS — Z7984 Long term (current) use of oral hypoglycemic drugs: Secondary | ICD-10-CM | POA: Diagnosis not present

## 2022-06-23 DIAGNOSIS — Z794 Long term (current) use of insulin: Secondary | ICD-10-CM

## 2022-06-23 DIAGNOSIS — S0031XA Abrasion of nose, initial encounter: Secondary | ICD-10-CM | POA: Diagnosis present

## 2022-06-23 DIAGNOSIS — R339 Retention of urine, unspecified: Secondary | ICD-10-CM | POA: Diagnosis not present

## 2022-06-23 DIAGNOSIS — R531 Weakness: Secondary | ICD-10-CM

## 2022-06-23 DIAGNOSIS — I10 Essential (primary) hypertension: Secondary | ICD-10-CM | POA: Diagnosis present

## 2022-06-23 LAB — CBC
HCT: 35.7 % — ABNORMAL LOW (ref 39.0–52.0)
Hemoglobin: 12.3 g/dL — ABNORMAL LOW (ref 13.0–17.0)
MCH: 31.9 pg (ref 26.0–34.0)
MCHC: 34.5 g/dL (ref 30.0–36.0)
MCV: 92.7 fL (ref 80.0–100.0)
Platelets: 215 10*3/uL (ref 150–400)
RBC: 3.85 MIL/uL — ABNORMAL LOW (ref 4.22–5.81)
RDW: 12.4 % (ref 11.5–15.5)
WBC: 12.7 10*3/uL — ABNORMAL HIGH (ref 4.0–10.5)
nRBC: 0 % (ref 0.0–0.2)

## 2022-06-23 LAB — I-STAT CHEM 8, ED
BUN: 27 mg/dL — ABNORMAL HIGH (ref 8–23)
Calcium, Ion: 1.2 mmol/L (ref 1.15–1.40)
Chloride: 100 mmol/L (ref 98–111)
Creatinine, Ser: 1 mg/dL (ref 0.61–1.24)
Glucose, Bld: 263 mg/dL — ABNORMAL HIGH (ref 70–99)
HCT: 35 % — ABNORMAL LOW (ref 39.0–52.0)
Hemoglobin: 11.9 g/dL — ABNORMAL LOW (ref 13.0–17.0)
Potassium: 3.9 mmol/L (ref 3.5–5.1)
Sodium: 134 mmol/L — ABNORMAL LOW (ref 135–145)
TCO2: 25 mmol/L (ref 22–32)

## 2022-06-23 LAB — COMPREHENSIVE METABOLIC PANEL
ALT: 75 U/L — ABNORMAL HIGH (ref 0–44)
AST: 36 U/L (ref 15–41)
Albumin: 3 g/dL — ABNORMAL LOW (ref 3.5–5.0)
Alkaline Phosphatase: 55 U/L (ref 38–126)
Anion gap: 11 (ref 5–15)
BUN: 25 mg/dL — ABNORMAL HIGH (ref 8–23)
CO2: 22 mmol/L (ref 22–32)
Calcium: 9 mg/dL (ref 8.9–10.3)
Chloride: 103 mmol/L (ref 98–111)
Creatinine, Ser: 1.13 mg/dL (ref 0.61–1.24)
GFR, Estimated: >60 mL/min (ref >60)
Glucose, Bld: 262 mg/dL — ABNORMAL HIGH (ref 70–99)
Potassium: 3.9 mmol/L (ref 3.5–5.1)
Sodium: 136 mmol/L (ref 135–145)
Total Bilirubin: 0.5 mg/dL (ref 0.3–1.2)
Total Protein: 5.4 g/dL — ABNORMAL LOW (ref 6.5–8.1)

## 2022-06-23 LAB — DIFFERENTIAL
Abs Immature Granulocytes: 0.12 10*3/uL — ABNORMAL HIGH (ref 0.00–0.07)
Basophils Absolute: 0 10*3/uL (ref 0.0–0.1)
Basophils Relative: 0 %
Eosinophils Absolute: 0 10*3/uL (ref 0.0–0.5)
Eosinophils Relative: 0 %
Immature Granulocytes: 1 %
Lymphocytes Relative: 11 %
Lymphs Abs: 1.4 10*3/uL (ref 0.7–4.0)
Monocytes Absolute: 0.6 10*3/uL (ref 0.1–1.0)
Monocytes Relative: 5 %
Neutro Abs: 10.5 10*3/uL — ABNORMAL HIGH (ref 1.7–7.7)
Neutrophils Relative %: 83 %

## 2022-06-23 LAB — PROTIME-INR
INR: 1 (ref 0.8–1.2)
Prothrombin Time: 13.4 seconds (ref 11.4–15.2)

## 2022-06-23 LAB — CBG MONITORING, ED
Glucose-Capillary: 253 mg/dL — ABNORMAL HIGH (ref 70–99)
Glucose-Capillary: 283 mg/dL — ABNORMAL HIGH (ref 70–99)

## 2022-06-23 LAB — APTT: aPTT: 23 seconds — ABNORMAL LOW (ref 24–36)

## 2022-06-23 LAB — ETHANOL: Alcohol, Ethyl (B): <10 mg/dL (ref <10)

## 2022-06-23 MED ORDER — ACETAMINOPHEN 650 MG RE SUPP: 650.0000 mg | RECTAL | Status: DC | PRN

## 2022-06-23 MED ORDER — LOSARTAN POTASSIUM 50 MG PO TABS: 100.0000 mg | ORAL_TABLET | Freq: Every day | ORAL | Status: DC

## 2022-06-23 MED ORDER — INSULIN ASPART 100 UNIT/ML IJ SOLN
0.0000 [IU] | Freq: Every day | INTRAMUSCULAR | Status: DC
  Administered 2022-06-23: 3 [IU] via SUBCUTANEOUS
  Administered 2022-06-25: 2 [IU] via SUBCUTANEOUS

## 2022-06-23 MED ORDER — CARVEDILOL 12.5 MG PO TABS
25.0000 mg | ORAL_TABLET | Freq: Two times a day (BID) | ORAL | Status: DC
  Administered 2022-06-23: 25 mg via ORAL
  Filled 2022-06-23: qty 2

## 2022-06-23 MED ORDER — ORAL CARE MOUTH RINSE: 15.0000 mL | OROMUCOSAL | Status: DC | PRN

## 2022-06-23 MED ORDER — INSULIN GLARGINE-YFGN 100 UNIT/ML ~~LOC~~ SOLN
25.0000 [IU] | Freq: Every day | SUBCUTANEOUS | Status: DC
  Administered 2022-06-24 – 2022-06-25 (×2): 25 [IU] via SUBCUTANEOUS
  Filled 2022-06-23 (×3): qty 0.25

## 2022-06-23 MED ORDER — DEXAMETHASONE SODIUM PHOSPHATE 4 MG/ML IJ SOLN
4.0000 mg | Freq: Every day | INTRAMUSCULAR | Status: DC
  Administered 2022-06-23 – 2022-06-24 (×2): 4 mg via INTRAVENOUS
  Filled 2022-06-23 (×2): qty 1

## 2022-06-23 MED ORDER — LEVETIRACETAM IN NACL 1500 MG/100ML IV SOLN
1500.0000 mg | Freq: Two times a day (BID) | INTRAVENOUS | Status: DC
  Administered 2022-06-23 – 2022-06-25 (×6): 1500 mg via INTRAVENOUS
  Filled 2022-06-23 (×6): qty 100

## 2022-06-23 MED ORDER — ONDANSETRON HCL 4 MG/2ML IJ SOLN: 4.0000 mg | Freq: Four times a day (QID) | INTRAMUSCULAR | Status: DC | PRN

## 2022-06-23 MED ORDER — DOCUSATE SODIUM 100 MG PO CAPS
100.0000 mg | ORAL_CAPSULE | Freq: Two times a day (BID) | ORAL | Status: DC
  Administered 2022-06-23 – 2022-06-25 (×5): 100 mg via ORAL
  Filled 2022-06-23 (×5): qty 1

## 2022-06-23 MED ORDER — IOHEXOL 350 MG/ML SOLN
100.0000 mL | Freq: Once | INTRAVENOUS | Status: AC | PRN
  Administered 2022-06-23: 75 mL via INTRAVENOUS

## 2022-06-23 MED ORDER — SODIUM CHLORIDE 0.9 % IV SOLN
200.0000 mg | Freq: Two times a day (BID) | INTRAVENOUS | Status: DC
  Administered 2022-06-23 – 2022-06-25 (×6): 200 mg via INTRAVENOUS
  Filled 2022-06-23 (×7): qty 20

## 2022-06-23 MED ORDER — POLYETHYLENE GLYCOL 3350 17 G PO PACK: 17.0000 g | PACK | Freq: Every day | ORAL | Status: DC | PRN

## 2022-06-23 MED ORDER — SODIUM CHLORIDE 0.9 % IV BOLUS
500.0000 mL | Freq: Once | INTRAVENOUS | Status: AC
  Administered 2022-06-23: 500 mL via INTRAVENOUS

## 2022-06-23 MED ORDER — ONDANSETRON HCL 4 MG PO TABS: 4.0000 mg | ORAL_TABLET | Freq: Four times a day (QID) | ORAL | Status: DC | PRN

## 2022-06-23 MED ORDER — INSULIN ASPART 100 UNIT/ML IJ SOLN
0.0000 [IU] | Freq: Three times a day (TID) | INTRAMUSCULAR | Status: DC
  Administered 2022-06-24: 8 [IU] via SUBCUTANEOUS
  Administered 2022-06-24 – 2022-06-25 (×2): 3 [IU] via SUBCUTANEOUS
  Administered 2022-06-25: 11 [IU] via SUBCUTANEOUS
  Administered 2022-06-25: 15 [IU] via SUBCUTANEOUS

## 2022-06-23 MED ORDER — AMLODIPINE BESYLATE 5 MG PO TABS: 5.0000 mg | ORAL_TABLET | Freq: Every day | ORAL | Status: DC

## 2022-06-23 MED ORDER — SODIUM CHLORIDE 0.9 % IV SOLN
75.0000 mL/h | INTRAVENOUS | Status: DC
  Administered 2022-06-23 – 2022-06-24 (×2): 75 mL/h via INTRAVENOUS

## 2022-06-23 MED ORDER — LORAZEPAM 2 MG/ML IJ SOLN: 4.0000 mg | INTRAMUSCULAR | Status: DC | PRN

## 2022-06-23 MED ORDER — ESCITALOPRAM OXALATE 10 MG PO TABS
10.0000 mg | ORAL_TABLET | Freq: Every day | ORAL | Status: DC
  Administered 2022-06-24 – 2022-06-25 (×2): 10 mg via ORAL
  Filled 2022-06-23 (×2): qty 1

## 2022-06-23 MED ORDER — SIMVASTATIN 20 MG PO TABS
40.0000 mg | ORAL_TABLET | Freq: Every day | ORAL | Status: DC
  Administered 2022-06-23 – 2022-06-25 (×3): 40 mg via ORAL
  Filled 2022-06-23 (×3): qty 2

## 2022-06-23 MED ORDER — GADOBUTROL 1 MMOL/ML IV SOLN
8.0000 mL | Freq: Once | INTRAVENOUS | Status: AC | PRN
  Administered 2022-06-23: 8 mL via INTRAVENOUS

## 2022-06-23 MED ORDER — ACETAMINOPHEN 325 MG PO TABS: 650.0000 mg | ORAL_TABLET | ORAL | Status: DC | PRN

## 2022-06-23 MED ORDER — BUSPIRONE HCL 5 MG PO TABS
5.0000 mg | ORAL_TABLET | Freq: Three times a day (TID) | ORAL | Status: DC
  Administered 2022-06-23 – 2022-06-25 (×8): 5 mg via ORAL
  Filled 2022-06-23 (×8): qty 1

## 2022-06-23 NOTE — ED Notes (Signed)
EEG at bedside.

## 2022-06-23 NOTE — Code Documentation (Signed)
Stroke Response Nurse Documentation Code Documentation  Jorge [name] is a 76 y.o. male arriving to [facility]  via [facility] on 06/23/2022 with past medical hx of Diabetes, HTN, Glioma.  Code stroke was activated by .   Patient from home where he was LKW at 1300 and now complaining of right sided weakness and slurred speech. Pt was reported to have a fall and then immediately have right sided weakness per family. They called EMS and EMS transported to ED.  Stroke team at the bedside on patient activation. Labs drawn and patient cleared for CT by Dr. Jeanell Sparrow. Patient to CT with team. NIHSS 9, see documentation for details and code stroke times. Patient with right arm weakness, bilateral leg weakness, Expressive aphasia , and dysarthria  on exam. The following imaging was completed:  CT Head and CTA. Patient is not a candidate for IV Thrombolytic due to contraindication.   Care Plan: q2 NIHSS/VS.   Bedside handoff with ED RN Andee Poles.    Kathrin Greathouse  Stroke Response RN

## 2022-06-23 NOTE — Procedures (Signed)
Patient Name: Jorge [name]  MRN: [id number]  Epilepsy Attending: Lora Havens  Referring Physician/Provider: Rosalin Hawking, MD  Date: 06/23/2022  Duration: 23.10 mins  Patient history: 76 year old male with history of glioma, seizures who presented with altered mental status.  EEG to evaluate for seizure.  Level of alertness: Awake  AEDs during EEG study: LEV, LCM  Technical aspects: This EEG study was done with scalp electrodes positioned according to the 10-20 International system of electrode placement. Electrical activity was reviewed with band pass filter of 1-'70Hz'$ , sensitivity of 7 uV/mm, display speed of 53m/sec with a '60Hz'$  notched filter applied as appropriate. EEG data were recorded continuously and digitally stored.  Video monitoring was available and reviewed as appropriate.  Description:  No clear posterior dominant rhythm was seen. EEG showed continuous generalized 3 to 6 Hz theta-delta slowing admixed with 12-'14Hz'$  beta activity. Hyperventilation and photic stimulation were not performed.      ABNORMALITY - Continuous slow, generalized   IMPRESSION: This technically difficult study is suggestive of moderate diffuse encephalopathy, nonspecific etiology. No seizures or epileptiform discharges were seen throughout the recording.   A normal interictal EEG does not exclude the diagnosis of epilepsy.     Vilma Will OBarbra Sarks

## 2022-06-23 NOTE — ED Triage Notes (Signed)
Patient BIB [name] from home after patient fell on the ground and his hit face at approximately 1300 today, history of brain tumor two months ago. EMS reports family states his behavior changed immediately after falling and he is now suddenly unable to stand.  20g saline lock in left forearm.

## 2022-06-23 NOTE — ED Notes (Signed)
Code Stroke Paged

## 2022-06-23 NOTE — Progress Notes (Signed)
EEG complete - results pending 

## 2022-06-23 NOTE — Consult Note (Signed)
Stroke Neurology Consultation Note  Consult Requested by: Dr. Maylon Peppers  Reason for Consult: code stroke  Consult Date: 06/23/22   The history was obtained from the family and medical staff.  During history and examination, all items were able to obtain unless otherwise noted.  History of Present Illness:  [name] is a 76 y.o. Caucasian male with PMH of hypertension, diabetes, left brain tumor and increased seizure activity presented to ED for code stroke.  Per family, patient had left brain tumor consistent with glioma, has been following with Dr. Mickeal Skinner neurooncology.  Was on Keppra 500 mg twice daily for seizure.  Recently increased seizure activity, his Keppra increased to 1000 twice daily and later on added Vimpat 100 twice daily.  Also had increased Decadron dose.  Patient seizure activity are staring into the space, activity arrest followed by confusion.  Today, patient was outside in the yard, last seen well 1300, then family found him on the ground, with trauma on the nose bridge, dazed off, confused but also found him to have right arm and leg weakness which was never happened in the past. He was sent to ER for evaluation.  In ER, patient awake alert, follow simple commands, answer orientation questions but with mild perseveration, still have left-sided weakness.  CT no acute abnormality but still has left brain mass with mass effect.  CT head and neck no LVO.  He was given extra dose of Keppra and Vimpat, started on Decadron IV.  LSN: 1300 tPA Given: No: Brain tumor and likely seizure IR: No, no LVO  Past Medical History:  Diagnosis Date   Anemia    Anxiety    Chronic kidney disease    Stage 3   COVID 08/2021   very mild   Diabetes mellitus without complication (HCC)    Environmental allergies    Hx: of   GERD (gastroesophageal reflux disease)    Hypertension    Numbness and tingling    Hx; of  Right shoulder    Past Surgical History:  Procedure Laterality Date    ANTERIOR CERVICAL DECOMP/DISCECTOMY FUSION N/A 03/17/2013   Procedure: ANTERIOR CERVICAL DECOMPRESSION/DISCECTOMY FUSION 2 LEVEL C5-7;  Surgeon: [name], MD;  Location: [facility];  Service: Orthopedics;  Laterality: N/A;   APPLICATION OF CRANIAL NAVIGATION Left 05/06/2022   Procedure: APPLICATION OF CRANIAL NAVIGATION VARIO GUIDE;  Surgeon: Vallarie Mare, MD;  Location: [facility];  Service: Neurosurgery;  Laterality: Left;   COLONOSCOPY     Hx: of   FRACTURE SURGERY Right    knee   FRAMELESS  BIOPSY WITH BRAINLAB Left 05/06/2022   Procedure: STEREOTACTIC BRAIN BIOPSY;  Surgeon: Vallarie Mare, MD;  Location: [facility];  Service: Neurosurgery;  Laterality: Left;   HERNIA REPAIR     TONSILLECTOMY      Family History  Problem Relation Age of Onset   Diabetes Brother     Social History:  reports that he has quit smoking. His smoking use included cigars. He has never used smokeless tobacco. He reports that he does not drink alcohol and does not use drugs.  Allergies: No Known Allergies  No current facility-administered medications on file prior to encounter.   Current Outpatient Medications on File Prior to Encounter  Medication Sig Dispense Refill   amLODipine (NORVASC) 5 MG tablet Take 1 tablet (5 mg total) by mouth daily. 90 tablet 1   busPIRone (BUSPAR) 5 MG tablet Take 5 mg by mouth 3 (three) times daily.  carvedilol (COREG) 12.5 MG tablet Take 25 mg by mouth 2 (two) times daily.     dexamethasone (DECADRON) 4 MG tablet Take 0.5 tablets (2 mg total) by mouth daily. 30 tablet 0   diphenhydrAMINE (BENADRYL) 25 MG tablet Take 25 mg by mouth every 6 (six) hours as needed for allergies.     diphenhydrAMINE-APAP, sleep, (TYLENOL PM EXTRA STRENGTH PO) Take 1 tablet by mouth as needed (pain).     escitalopram (LEXAPRO) 10 MG tablet Take 1 tablet (10 mg total) by mouth daily. 90 tablet 1   insulin glargine (LANTUS) 100 UNIT/ML Solostar Pen Inject 25 Units into the skin daily. 15 mL 1    insulin lispro (HUMALOG KWIKPEN) 100 UNIT/ML KwikPen Inject 5-15 Units into the skin 3 (three) times daily. Take 5 units with a meal, may use addition 1-10 units for correction factor of 50 15 mL 11   Lacosamide (VIMPAT) 100 MG TABS Take 1 tablet (100 mg total) by mouth 2 (two) times daily. 60 tablet 1   levETIRAcetam (KEPPRA) 500 MG tablet Take 1 tablet (500 mg total) by mouth 2 (two) times daily. 180 tablet 0   losartan (COZAAR) 100 MG tablet Take 1 tablet (100 mg total) by mouth daily. 90 tablet 1   metFORMIN (GLUCOPHAGE-XR) 500 MG 24 hr tablet Take 1-2 tablets (500-1,000 mg total) by mouth See admin instructions. Take 500 mg with breakfast and 1000 mg with supper 270 tablet 1   naproxen sodium (ALEVE) 220 MG tablet Take 220-440 mg by mouth daily as needed (pain).     pantoprazole (PROTONIX) 40 MG tablet Take 1 tablet (40 mg total) by mouth daily as needed (acid reflux). 90 tablet 0   simvastatin (ZOCOR) 40 MG tablet Take 1 tablet (40 mg total) by mouth at bedtime. 90 tablet 1   ACCU-CHEK GUIDE test strip TEST UP TO FOUR TIMES DAILY AS DIRECTED 100 strip 5   blood glucose meter kit and supplies KIT Dispense based on patient and insurance preference. Use up to four times daily as directed. 1 each 0    Review of Systems: A full ROS was attempted today and was able to be performed.  Systems assessed include - Constitutional, Eyes, HENT, Respiratory, Cardiovascular, Gastrointestinal, Genitourinary, Integument/breast, Hematologic/lymphatic, Musculoskeletal, Neurological, Behavioral/Psych, Endocrine, Allergic/Immunologic - with pertinent responses as per HPI.  Physical Examination: Temp:  [97.5 F (36.4 C)-97.6 F (36.4 C)] 97.5 F (36.4 C) (10/16 1438) Pulse Rate:  [70-74] 71 (10/16 1615) Resp:  [12-16] 14 (10/16 1615) BP: (111-142)/(64-75) 134/74 (10/16 1615) SpO2:  [98 %-99 %] 99 % (10/16 1615)  General - well nourished, well developed, in no apparent distress.    Ophthalmologic - fundi not  visualized due to noncooperation.    Cardiovascular - regular rhythm and rate  Skin - nose bridge traumatic skin tear  Neuro - awake, alert, eyes open, orientated to age, place, month. No aphasia, paucity of speech, following all simple commands. Able to name and repeat, with mild perseveration. No gaze palsy, tracking bilaterally, blinking to visual threat bilaterally. No facial droop. Tongue midline. LUE 4/5 and LLE at least 3/5. RUE 3-/5 and RLE 1/5. Sensation symmetrical bilaterally, right FTN intact grossly, gait not tested.   NIH Stroke Scale  Level Of Consciousness 0=Alert; keenly responsive 1=Arouse to minor stimulation 2=Requires repeated stimulation to arouse or movements to pain 3=postures or unresponsive 0  LOC Questions to Month and Age 22=Answers both questions correctly 1=Answers one question correctly or dysarthria/intubated/trauma/language barrier 2=Answers neither question  correctly or aphasia 0  LOC Commands      -Open/Close eyes     -Open/close grip     -Pantomime commands if communication barrier 0=Performs both tasks correctly 1=Performs one task correctly 2=Performs neighter task correctly 0  Best Gaze     -Only assess horizontal gaze 0=Normal 1=Partial gaze palsy 2=Forced deviation, or total gaze paresis 0  Visual 0=No visual loss 1=Partial hemianopia 2=Complete hemianopia 3=Bilateral hemianopia (blind including cortical blindness) 0  Facial Palsy     -Use grimace if obtunded 0=Normal symmetrical movement 1=Minor paralysis (asymmetry) 2=Partial paralysis (lower face) 3=Complete paralysis (upper and lower face) 0  Motor  0=No drift for 10/5 seconds 1=Drift, but does not hit bed 2=Some antigravity effort, hits  bed 3=No effort against gravity, limb falls 4=No movement 0=Amputation/joint fusion Right Arm 2     Leg 3    Left Arm 0     Leg 1  Limb Ataxia     - FNT/HTS 0=Absent or does not understand or paralyzed or amputation/joint fusion 1=Present in  one limb 2=Present in two limbs 0  Sensory 0=Normal 1=Mild to moderate sensory loss 2=Severe to total sensory loss or coma/unresponsive 0  Best Language 0=No aphasia, normal 1=Mild to moderate aphasia 2=Severe aphasia 3=Mute, global aphasia, or coma/unresponsive 1  Dysarthria 0=Normal 1=Mild to moderate 2=Severe, unintelligible or mute/anarthric 0=intubated/unable to test 1  Extinction/Neglect 0=No abnormality 1=visual/tactile/auditory/spatia/personal inattention/Extinction to bilateral simultaneous stimulation 2=Profound neglect/extinction more than 1 modality  0  Total   8     Data Reviewed: CT MAXILLOFACIAL W CONTRAST  Result Date: 06/23/2022 CLINICAL DATA:  Trauma EXAM: CT MAXILLOFACIAL WITH CONTRAST TECHNIQUE: Multidetector CT imaging of the maxillofacial structures was performed with intravenous contrast. Multiplanar CT image reconstructions were also generated. RADIATION DOSE REDUCTION: This exam was performed according to the departmental dose-optimization program which includes automated exposure control, adjustment of the mA and/or kV according to patient size and/or use of iterative reconstruction technique. CONTRAST:  32m OMNIPAQUE IOHEXOL 350 MG/ML SOLN COMPARISON:  None Available. FINDINGS: Osseous: Postsurgical changes from prior left pterional craniotomy. Partially visualized cervical spinal fusion hardware in place. Orbits: Negative. No traumatic or inflammatory finding. Bilateral lens replacements. Sinuses: Trace mucosal thickening of the floor of bilateral maxillary sinuses and the bilateral frontal sinuses. Soft tissues: There is mild soft tissue stranding over the left pterional craniotomy site (Series 7, image 51). 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 No evidence of facial bone  fracture. 2. Mild soft tissue stranding over the left pterional craniotomy site, which is nonspecific but could represent a small amount of contusion, postsurgical change, or possible infection; correlate with physical exam. 3. 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 Please see separately dictated CT head and neck angiogram for additional findings. Electronically Signed   By: HMarin RobertsM.D.   On: 06/23/2022 15:16   CT ANGIO HEAD NECK W WO CM (CODE STROKE)  Result Date: 06/23/2022 CLINICAL DATA:  Stroke, follow up EXAM: CT ANGIOGRAPHY HEAD AND NECK TECHNIQUE: Multidetector CT imaging of the head and neck was performed using the standard protocol during bolus administration of intravenous contrast. Multiplanar CT image reconstructions and MIPs were obtained to evaluate the vascular anatomy. Carotid stenosis measurements (when applicable) are obtained utilizing NASCET criteria, using the distal internal carotid diameter as the  denominator. RADIATION DOSE REDUCTION: This exam was performed according to the departmental dose-optimization program which includes automated exposure control, adjustment of the mA and/or kV according to patient size and/or use of iterative reconstruction technique. CONTRAST:  71m OMNIPAQUE IOHEXOL 350 MG/ML SOLN COMPARISON:  Same day CT head. FINDINGS: CTA NECK FINDINGS Aortic arch: Great vessel origins are patent without significant stenosis. Right carotid system: No evidence of dissection, stenosis (50% or greater), or occlusion. Left carotid system: No evidence of dissection, stenosis (50% or greater), or occlusion. Vertebral arteries: Left dominant. No evidence of dissection, stenosis (50% or greater), or occlusion. Mild right vertebral artery origin stenosis. Skeleton: Please see concurrent CT of the cervical spine for further evaluation of the cervical spine. Other neck: No acute findings. Upper chest: Visualized lung apices are clear. Review  of the MIP images confirms the above findings CTA HEAD FINDINGS Anterior circulation: Bilateral intracranial ICAs are patent. 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 Bilateral MCAs are patent. Mild left M1 MCA stenosis. Bilateral ACAs are patent without proximal hemodynamically significant stenosis. Posterior circulation: Bilateral intradural vertebral arteries, basilar artery and bilateral posterior cerebral arteries are patent without proximal hemodynamically significant stenosis. Venous sinuses: Limited evaluation due to arterial contrast timing. Review of the MIP images confirms the above findings IMPRESSION: 1. No emergent large vessel occlusion. 2. 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 Electronically Signed   By: FMargaretha SheffieldM.D.   On: 06/23/2022 15:12   CT C-SPINE NO CHARGE  Result Date: 06/23/2022 CLINICAL DATA:  Fall EXAM: CT Cervical Spine without contrast TECHNIQUE: Multiplanar CT images of the cervical spine were reconstructed from contemporary CT of the Neck. RADIATION DOSE REDUCTION: This exam was performed according to the departmental dose-optimization program which includes automated exposure control, adjustment of the mA and/or kV according to patient size and/or use of iterative reconstruction technique. CONTRAST:  None or No additional COMPARISON:  Radiograph 03/17/2013 FINDINGS: Alignment: Straightening of the cervical spine. Trace retrolisthesis C3 on C4. Facet alignment within normal limits. Skull base and vertebrae: No acute fracture. No primary bone lesion or focal pathologic process. Soft tissues and spinal canal: No prevertebral fluid or swelling. No visible canal hematoma. Disc levels: Anterior fusion changes C5 through C7, interbody device at C5-C6. Moderate severe disc space narrowing and degenerative change C2-C3 and C3-C4. Moderate bilateral foraminal narrowing at these levels due to  spurring. Upper chest: Negative. Other: None IMPRESSION: Postsurgical changes C5 through C7. No definite acute osseous abnormality. Electronically Signed   By: KDonavan FoilM.D.   On: 06/23/2022 15:10   CT HEAD CODE STROKE WO CONTRAST  Result Date: 06/23/2022 CLINICAL DATA:  Code stroke. EXAM: CT HEAD WITHOUT CONTRAST TECHNIQUE: Contiguous axial images were obtained from the base of the skull through the vertex without intravenous contrast. RADIATION DOSE REDUCTION: This exam was performed according to the departmental dose-optimization program which includes automated exposure control, adjustment of the mA and/or kV according to patient size and/or use of iterative reconstruction technique. COMPARISON:  CT head 05/07/2022. FINDINGS: Brain: Similar versus slight progression of hypoattenuation involving the anterior left temporal lobe, left insula and operculum, and right basal ganglia and anterior temporal lobe further characterized on prior MRI. No evidence of superimposed acute large vascular territory infarct or acute hemorrhage. Vascular: No definite hyperdense vessel identified. Skull: No acute fracture. Sinuses/Orbits: Largely clear sinuses.  No acute orbital findings. Other: No mastoid effusions. IMPRESSION: 1. No definite  evidence of acute large vascular territory infarct or acute hemorrhage. 2. Similar versus slight progression infiltrative abnormality in the left cerebrum, better characterized on prior MRI. A repeat MRI with contrast could better characterize and assess for progression if clinically warranted. Code stroke imaging results were communicated on 06/23/2022 at 2:25 pm to provider Dr. Erlinda Hong via secure text paging. Electronically Signed   By: Margaretha Sheffield M.D.   On: 06/23/2022 14:25    Assessment: 76 y.o. male with PMH of hypertension, diabetes, left brain tumor and increased seizure activity presented to ED for code stroke. LSW 1300 and NIHSS = 8. CT no acute abnormality but still has  left brain mass with mass effect.  CT head and neck no LVO.  Patient not a tPA candidate given brain tumor, not IR candidate given no LVO.  Etiology for patient symptoms concerning for seizure with Todd's process. He was given extra dose of Keppra and Vimpat, started on Decadron IV.  Will do MRI and the EEG for further evaluation.  Plan: On admission by hospitalist service and continue further work up  Frequent neuro checks Telemetry monitoring MRI brain with and without contrast EEG stat PT/OT/speech consult Extra Keppra and vimpat dose, and continue maintenance dose.  IV decadron.  Continue follow-up with Dr. Mickeal Skinner Discussed with Dr. Maylon Peppers ED physician We will follow   Thank you for this consultation and allowing [place] to participate in the care of this patient.  Rosalin Hawking, MD PhD Stroke Neurology 06/23/2022 5:36 PM

## 2022-06-23 NOTE — H&P (Signed)
History and Physical    Patient: Jorge [name] OXB:[id number] DOB: 1946-04-28 DOA: 06/23/2022 DOS: the patient was seen and examined on 06/23/2022 PCP: Gweneth Fritter, FNP  Patient coming from: Home - Lives with wife; NOK: Wife, [phone]   Chief Complaint: Fall  HPI: Jorge [name] is a 76 y.o. male with medical history significant of stage 3 CKD; DM; and HTN presenting with a fall, recent brain tumor.   Family reports that "this time, he was out in the yard feeding the dog and he fell".  Not sure if he had a seizure or just fell.  The patient reports that he tripped over the dog's chain - and family confirms that the dog was chained so this story is plausible.  He is having a seizure every 2-3 days, last previous one was yesterday at lunchtime.  His seizures are staring off, talking crazy and then he is post-ictal.  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 Mickeal Skinner is still doing more tests.  Dr. Marcello Moores did the biopsy and they isn't really a role for further intervention.  They have an appointment at [facility] tomorrow for another opinion.  He has been having seizures since before the biopsy.  He had trouble 4 years ago and was diagnosed as mini-strokes but they actually think it was the tumor back in 2019.  MRI was previously ordered for next week, will be today instead.  If inflammation, high dose steroids; if tumor, rads/chemo.  Since they have no diagnosis, they don't have a good treatment plan.      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  Came in as code stroke, unwitnessed fall in parking lot and then was confused.  Having seizures q3 days.  Imaging with no stroke.  Neuro suggests MRI and EEG, further seizure treatment.  Increased dose of Vimpat and given Decadron.     Review of Systems: As mentioned in the history of present illness. All other systems reviewed and are negative. Past Medical History:  Diagnosis Date    Anemia    Anxiety    Chronic kidney disease    Stage 3   COVID 08/2021   very mild   Diabetes mellitus without complication (HCC)    Environmental allergies    Hx: of   GERD (gastroesophageal reflux disease)    Hypertension    Numbness and tingling    Hx; of  Right shoulder   Past Surgical History:  Procedure Laterality Date   ANTERIOR CERVICAL DECOMP/DISCECTOMY FUSION N/A 03/17/2013   Procedure: ANTERIOR CERVICAL DECOMPRESSION/DISCECTOMY FUSION 2 LEVEL C5-7;  Surgeon: [name], MD;  Location: [place];  Service: Orthopedics;  Laterality: N/A;   APPLICATION OF CRANIAL NAVIGATION Left 05/06/2022   Procedure: APPLICATION OF CRANIAL NAVIGATION VARIO GUIDE;  Surgeon: Vallarie Mare, MD;  Location: [place];  Service: Neurosurgery;  Laterality: Left;   COLONOSCOPY     Hx: of   FRACTURE SURGERY Right    knee   FRAMELESS  BIOPSY WITH BRAINLAB Left 05/06/2022   Procedure: STEREOTACTIC BRAIN BIOPSY;  Surgeon: Vallarie Mare, MD;  Location: [place];  Service: Neurosurgery;  Laterality: Left;   HERNIA REPAIR     TONSILLECTOMY     Social History:  reports that he has quit smoking. His smoking use included cigars. He has never used smokeless tobacco. He reports that he does not drink alcohol and does not use drugs.  No  Known Allergies  Family History  Problem Relation Age of Onset   Diabetes Brother     Prior to Admission medications   Medication Sig Start Date End Date Taking? Authorizing Provider  ACCU-CHEK GUIDE test strip TEST UP TO FOUR TIMES DAILY AS DIRECTED 05/27/22   Lucious Groves, DO  amLODipine (NORVASC) 5 MG tablet Take 1 tablet (5 mg total) by mouth daily. 05/20/22   Lucious Groves, DO  blood glucose meter kit and supplies KIT Dispense based on patient and insurance preference. Use up to four times daily as directed. 05/04/22   Angelique Blonder, DO  dexamethasone (DECADRON) 4 MG tablet Take 0.5 tablets (2 mg total) by mouth daily. 06/10/22   Ventura Sellers, MD   diphenhydrAMINE (BENADRYL) 25 MG tablet Take 25 mg by mouth every 6 (six) hours as needed for allergies.    [provider]  escitalopram (LEXAPRO) 10 MG tablet Take 1 tablet (10 mg total) by mouth daily. 05/20/22   Lucious Groves, DO  insulin glargine (LANTUS) 100 UNIT/ML Solostar Pen Inject 25 Units into the skin daily. 05/04/22   Angelique Blonder, DO  insulin lispro (HUMALOG KWIKPEN) 100 UNIT/ML KwikPen Inject 5-15 Units into the skin 3 (three) times daily. Take 5 units with a meal, may use addition 1-10 units for correction factor of 50 05/28/22   Lucious Groves, DO  Lacosamide (VIMPAT) 100 MG TABS Take 1 tablet (100 mg total) by mouth 2 (two) times daily. 06/10/22   Ventura Sellers, MD  levETIRAcetam (KEPPRA) 500 MG tablet Take 1 tablet (500 mg total) by mouth 2 (two) times daily. 05/20/22   Lucious Groves, DO  losartan (COZAAR) 100 MG tablet Take 1 tablet (100 mg total) by mouth daily. 05/20/22   Lucious Groves, DO  metFORMIN (GLUCOPHAGE-XR) 500 MG 24 hr tablet Take 1-2 tablets (500-1,000 mg total) by mouth See admin instructions. Take 500 mg with breakfast and 1000 mg with supper 05/20/22   Lucious Groves, DO  naproxen sodium (ALEVE) 220 MG tablet Take 220-440 mg by mouth daily as needed (pain).    [provider]  pantoprazole (PROTONIX) 40 MG tablet Take 1 tablet (40 mg total) by mouth daily as needed (acid reflux). 05/20/22   Lucious Groves, DO  simvastatin (ZOCOR) 40 MG tablet Take 1 tablet (40 mg total) by mouth at bedtime. 05/20/22   Lucious Groves, DO    Physical Exam: Vitals:   06/23/22 1515 06/23/22 1530 06/23/22 1545 06/23/22 1615  BP: 123/72 134/69 (!) 142/75 134/74  Pulse: 71 70 74 71  Resp: 14 12 13 14   Temp:      TempSrc:      SpO2: 98% 98% 99% 99%   General:  Appears calm and comfortable and is in NAD, scattered facial excoriations including his R forehead and nasal bridge Eyes:  PERRL, EOMI, normal lids, iris ENT:  grossly normal hearing, lips &  tongue, mmm Neck:  no LAD, masses or thyromegaly Cardiovascular:  RRR, no m/r/g. No LE edema.  Respiratory:   CTA bilaterally with no wheezes/rales/rhonchi.  Normal respiratory effort. Abdomen:  soft, NT, ND Skin:  no rash or induration seen on limited exam Musculoskeletal:  grossly normal tone BUE/BLE, good ROM, no bony abnormality Psychiatric:  blunted mood and affect, speech fluent and appropriate, AOx3 Neurologic:  CN 2-12 grossly intact, moves all extremities in coordinated fashion   Radiological Exams on Admission: Independently reviewed - see discussion in A/P where applicable  CT MAXILLOFACIAL W CONTRAST  Result Date: 06/23/2022 CLINICAL DATA:  Trauma EXAM: CT MAXILLOFACIAL WITH CONTRAST TECHNIQUE: Multidetector CT imaging of the maxillofacial structures was performed with intravenous contrast. Multiplanar CT image reconstructions were also generated. RADIATION DOSE REDUCTION: This exam was performed according to the departmental dose-optimization program which includes automated exposure control, adjustment of the mA and/or kV according to patient size and/or use of iterative reconstruction technique. CONTRAST:  14m OMNIPAQUE IOHEXOL 350 MG/ML SOLN COMPARISON:  None Available. FINDINGS: Osseous: Postsurgical changes from prior left pterional craniotomy. Partially visualized cervical spinal fusion hardware in place. Orbits: Negative. No traumatic or inflammatory finding. Bilateral lens replacements. Sinuses: Trace mucosal thickening of the floor of bilateral maxillary sinuses and the bilateral frontal sinuses. Soft tissues: There is mild soft tissue stranding over the left pterional craniotomy site (Series 7, image 51). 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 No evidence of facial bone fracture. 2. Mild soft  tissue stranding over the left pterional craniotomy site, which is nonspecific but could represent a small amount of contusion, postsurgical change, or possible infection; correlate with physical exam. 3. 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 Please see separately dictated CT head and neck angiogram for additional findings. Electronically Signed   By: HMarin RobertsM.D.   On: 06/23/2022 15:16   CT ANGIO HEAD NECK W WO CM (CODE STROKE)  Result Date: 06/23/2022 CLINICAL DATA:  Stroke, follow up EXAM: CT ANGIOGRAPHY HEAD AND NECK TECHNIQUE: Multidetector CT imaging of the head and neck was performed using the standard protocol during bolus administration of intravenous contrast. Multiplanar CT image reconstructions and MIPs were obtained to evaluate the vascular anatomy. Carotid stenosis measurements (when applicable) are obtained utilizing NASCET criteria, using the distal internal carotid diameter as the denominator. RADIATION DOSE REDUCTION: This exam was performed according to the departmental dose-optimization program which includes automated exposure control, adjustment of the mA and/or kV according to patient size and/or use of iterative reconstruction technique. CONTRAST:  768mOMNIPAQUE IOHEXOL 350 MG/ML SOLN COMPARISON:  Same day CT head. FINDINGS: CTA NECK FINDINGS Aortic arch: Great vessel origins are patent without significant stenosis. Right carotid system: No evidence of dissection, stenosis (50% or greater), or occlusion. Left carotid system: No evidence of dissection, stenosis (50% or greater), or occlusion. Vertebral arteries: Left dominant. No evidence of dissection, stenosis (50% or greater), or occlusion. Mild right vertebral artery origin stenosis. Skeleton: Please see concurrent CT of the cervical spine for further evaluation of the cervical spine. Other neck: No acute findings. Upper chest: Visualized lung apices are clear. Review of the MIP images  confirms the above findings CTA HEAD FINDINGS Anterior circulation: Bilateral intracranial ICAs are patent. 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 Bilateral MCAs are patent. Mild left M1 MCA stenosis. Bilateral ACAs are patent without proximal hemodynamically significant stenosis. Posterior circulation: Bilateral intradural vertebral arteries, basilar artery and bilateral posterior cerebral arteries are patent without proximal hemodynamically significant stenosis. Venous sinuses: Limited evaluation due to arterial contrast timing. Review of the MIP images confirms the above findings IMPRESSION: 1. No emergent large vessel occlusion. 2. 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 Electronically Signed   By: FrMargaretha Sheffield.D.   On: 06/23/2022 15:12   CT C-SPINE  NO CHARGE  Result Date: 06/23/2022 CLINICAL DATA:  Fall EXAM: CT Cervical Spine without contrast TECHNIQUE: Multiplanar CT images of the cervical spine were reconstructed from contemporary CT of the Neck. RADIATION DOSE REDUCTION: This exam was performed according to the departmental dose-optimization program which includes automated exposure control, adjustment of the mA and/or kV according to patient size and/or use of iterative reconstruction technique. CONTRAST:  None or No additional COMPARISON:  Radiograph 03/17/2013 FINDINGS: Alignment: Straightening of the cervical spine. Trace retrolisthesis C3 on C4. Facet alignment within normal limits. Skull base and vertebrae: No acute fracture. No primary bone lesion or focal pathologic process. Soft tissues and spinal canal: No prevertebral fluid or swelling. No visible canal hematoma. Disc levels: Anterior fusion changes C5 through C7, interbody device at C5-C6. Moderate severe disc space narrowing and degenerative change C2-C3 and C3-C4. Moderate bilateral foraminal narrowing at these levels due to spurring. Upper chest:  Negative. Other: None IMPRESSION: Postsurgical changes C5 through C7. No definite acute osseous abnormality. Electronically Signed   By: Donavan Foil M.D.   On: 06/23/2022 15:10   CT HEAD CODE STROKE WO CONTRAST  Result Date: 06/23/2022 CLINICAL DATA:  Code stroke. EXAM: CT HEAD WITHOUT CONTRAST TECHNIQUE: Contiguous axial images were obtained from the base of the skull through the vertex without intravenous contrast. RADIATION DOSE REDUCTION: This exam was performed according to the departmental dose-optimization program which includes automated exposure control, adjustment of the mA and/or kV according to patient size and/or use of iterative reconstruction technique. COMPARISON:  CT head 05/07/2022. FINDINGS: Brain: Similar versus slight progression of hypoattenuation involving the anterior left temporal lobe, left insula and operculum, and right basal ganglia and anterior temporal lobe further characterized on prior MRI. No evidence of superimposed acute large vascular territory infarct or acute hemorrhage. Vascular: No definite hyperdense vessel identified. Skull: No acute fracture. Sinuses/Orbits: Largely clear sinuses.  No acute orbital findings. Other: No mastoid effusions. IMPRESSION: 1. No definite evidence of acute large vascular territory infarct or acute hemorrhage. 2. Similar versus slight progression infiltrative abnormality in the left cerebrum, better characterized on prior MRI. A repeat MRI with contrast could better characterize and assess for progression if clinically warranted. Code stroke imaging results were communicated on 06/23/2022 at 2:25 pm to provider Dr. Erlinda Hong via secure text paging. Electronically Signed   By: Margaretha Sheffield M.D.   On: 06/23/2022 14:25    EKG: Independently reviewed.  NSR with rate 73; no evidence of acute ischemia   Labs on Admission: I have personally reviewed the available labs and imaging studies at the time of the admission.  Pertinent labs:     Glucose 262 Albumin 3.0 AST 36/ALT 75 WBC 12.7 ETOH <10   Assessment and Plan: Principal Problem:   Fall at home, initial encounter Active Problems:   Essential hypertension   Type 2 diabetes mellitus with stage 3b chronic kidney disease, without long-term current use of insulin (HCC)   Chronic kidney disease, stage 3a ([place])   Seizure disorder ([place])   Brain tumor ([place])    Fall -Patient with an unwitnessed fall in the setting of known brain tumor and uncontrolled seizures -He reports that this was a mechanical fall and that is plausible -Will admit for further evaluation -PT/OT, TOC team consults  Uncontrolled seizures -Patient with known h/o seizures presenting with ongoing breakthrough seizures despite addition of Vimpat -Neurology has seen the patient -He is being treated with Keppra 1500 mg BID (1000 mg BID at home) and Vimpat 200  mg BID (dose doubled since it was started on 10/3) -Decadron was recently increased to 4 mg daily x 7 days from usual 2 mg dose; he is now receiving 4 mg IV daily per neurology -Will order EEG and MRI -He also will need driving restriction for at least 6 months -Seizure precautions -Ativan prn   Brain tumor -This is a challenging situation since he does not yet have a definitive diagnosis -[facility] at [facility] are pending -He is awaiting a consult at [facility] for a second opinion, possibly as early as tomorrow -Will order MRI with and without contrast for further evaluation per Dr. Mickeal Skinner -Decadron as above  DM -Last A1c was 7.6, indicating suboptimal control -Hold metformin -Continue glargine -Cover with moderate-scale SSI   HTN -Continue amlodipine, carvedilol, losartan  HLD -Continue simvastatin  Stage 3a CKD -Appears to be stable at this time -Attempt to avoid nephrotoxic medications -Recheck BMP in AM   Mood d/o -Continue buspirone, escitalopram     Advance Care Planning:   Code Status: Full Code   Consults:  Neurology; neuro-oncology (telephone only?); PT/OT; TOC team  DVT Prophylaxis: SCDs  Family Communication: Sons were present throughout evaluation  Severity of Illness: The appropriate patient status for this patient is INPATIENT. Inpatient status is judged to be reasonable and necessary in order to provide the required intensity of service to ensure the patient's safety. The patient's presenting symptoms, physical exam findings, and initial radiographic and laboratory data in the context of their chronic comorbidities is felt to place them at high risk for further clinical deterioration. Furthermore, it is not anticipated that the patient will be medically stable for discharge from the hospital within 2 midnights of admission.   * I certify that at the point of admission it is my clinical judgment that the patient will require inpatient hospital care spanning beyond 2 midnights from the point of admission due to high intensity of service, high risk for further deterioration and high frequency of surveillance required.*  Author: Karmen Bongo, MD 06/23/2022 6:17 PM  For on call review [email or web address].

## 2022-06-23 NOTE — ED Provider Notes (Signed)
Patient was signed out to me at 1500 by Dr. Jeanell Sparrow pending CT reads and final neurology recommendations.  In short this is a 76 year old male with a past medical history of glioma with seizures on Keppra and Vimpat, diabetes and hypertension presenting to the emergency department as a code stroke.  The patient had a fall outside that was unwitnessed and hit his face and after the fall was confused and unable to walk.  He had notable right-sided weakness and was made a stroke alert.  Due to patient's history of glioma he is not a TNK candidate.  Upon my evaluation, the patient is awake but drowsy in the room oriented to person and place.  He does have mild drift in bilateral upper extremities and is unable to grip with his right upper extremity, sensation is intact, no obvious facial droop but does have an abrasion to his nose.  Unclear if patient may have had a stroke versus seizure causing the fall today.  CT imaging are pending at this time.   Kemper Durie, DO 06/23/22 1528

## 2022-06-23 NOTE — ED Provider Notes (Signed)
[facility] EMERGENCY DEPARTMENT Provider Note   CSN: [id number] Arrival date & time: 06/23/22  1344  An emergency department physician performed an initial assessment on this suspected stroke patient at 1406.  History  Chief Complaint  Patient presents with   Code Stroke    Jorge [name] is a 76 y.o. male.  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      Home Medications Prior to Admission medications   Medication Sig Start Date End Date Taking? Authorizing Provider  ACCU-CHEK GUIDE test strip TEST UP TO FOUR TIMES DAILY AS DIRECTED 05/27/22   Lucious Groves, DO  amLODipine (NORVASC) 5 MG tablet Take 1 tablet (5 mg total) by mouth daily. 05/20/22   Lucious Groves, DO  blood glucose meter kit and supplies KIT Dispense based on patient and insurance preference. Use up to four times daily as directed. 05/04/22   Angelique Blonder, DO  dexamethasone (DECADRON) 4 MG tablet Take 0.5 tablets (2 mg total) by mouth daily. 06/10/22   Ventura Sellers, MD  diphenhydrAMINE (BENADRYL) 25 MG tablet Take 25 mg by mouth every 6 (six) hours as needed for allergies.    [provider]  escitalopram (LEXAPRO) 10 MG tablet Take 1 tablet (10 mg total) by mouth daily. 05/20/22   Lucious Groves, DO  insulin glargine (LANTUS) 100 UNIT/ML Solostar Pen Inject 25 Units into the skin daily. 05/04/22   Angelique Blonder, DO  insulin lispro (HUMALOG KWIKPEN) 100 UNIT/ML KwikPen Inject 5-15 Units into the skin 3 (three) times daily. Take 5 units with a meal, may use addition 1-10 units for correction factor of 50 05/28/22   Lucious Groves, DO  Lacosamide (VIMPAT) 100 MG TABS Take 1 tablet (100 mg total) by mouth 2 (two) times daily. 06/10/22   Ventura Sellers, MD  levETIRAcetam (KEPPRA) 500 MG tablet Take 1 tablet (500 mg total) by mouth 2 (two) times daily. 05/20/22   Lucious Groves, DO   losartan (COZAAR) 100 MG tablet Take 1 tablet (100 mg total) by mouth daily. 05/20/22   Lucious Groves, DO  metFORMIN (GLUCOPHAGE-XR) 500 MG 24 hr tablet Take 1-2 tablets (500-1,000 mg total) by mouth See admin instructions. Take 500 mg with breakfast and 1000 mg with supper 05/20/22   Lucious Groves, DO  naproxen sodium (ALEVE) 220 MG tablet Take 220-440 mg by mouth daily as needed (pain).    [provider]  pantoprazole (PROTONIX) 40 MG tablet Take 1 tablet (40 mg total) by mouth daily as needed (acid reflux). 05/20/22   Lucious Groves, DO  simvastatin (ZOCOR) 40 MG tablet Take 1 tablet (40 mg total) by mouth at bedtime. 05/20/22   Lucious Groves, DO      Allergies    Patient has no known allergies.    Review of Systems   Review of Systems  Physical Exam Updated Vital Signs BP 134/74   Pulse 71   Temp (!) 97.5 F (36.4 C) (Oral)   Resp 14   SpO2 99%  Physical Exam Vitals reviewed.  HENT:     Head: Normocephalic.     Comments: Abrasion over nose    Nose:     Comments: abrasion    Mouth/Throat:     Mouth: Mucous membranes are moist.  Eyes:     Pupils: Pupils are equal, round, and reactive to light.  Cardiovascular:     Rate and Rhythm: Regular rhythm.     Pulses: Normal pulses.  Pulmonary:     Effort: Pulmonary effort is normal.  Abdominal:     General: Bowel sounds are normal. There is distension.  Musculoskeletal:     Cervical back: Normal range of motion.  Neurological:     Mental Status: He is alert.     Comments: Unable to hold right arm or leg up against gravity Speech with some hesitancy  Psychiatric:        Mood and Affect: Mood normal.     ED Results / Procedures / Treatments   Labs (all labs ordered are listed, but only abnormal results are displayed) Labs Reviewed  APTT - Abnormal; Notable for the following components:      Result Value   aPTT 23 (*)    All other components within normal limits  CBC - Abnormal; Notable for the following  components:   WBC 12.7 (*)    RBC 3.85 (*)    Hemoglobin 12.3 (*)    HCT 35.7 (*)    All other components within normal limits  DIFFERENTIAL - Abnormal; Notable for the following components:   Neutro Abs 10.5 (*)    Abs Immature Granulocytes 0.12 (*)    All other components within normal limits  COMPREHENSIVE METABOLIC PANEL - Abnormal; Notable for the following components:   Glucose, Bld 262 (*)    BUN 25 (*)    Total Protein 5.4 (*)    Albumin 3.0 (*)    ALT 75 (*)    All other components within normal limits  I-STAT CHEM 8, ED - Abnormal; Notable for the following components:   Sodium 134 (*)    BUN 27 (*)    Glucose, Bld 263 (*)    Hemoglobin 11.9 (*)    HCT 35.0 (*)    All other components within normal limits  CBG MONITORING, ED - Abnormal; Notable for the following components:   Glucose-Capillary 253 (*)    All other components within normal limits  ETHANOL  PROTIME-INR  RAPID URINE DRUG SCREEN, HOSP PERFORMED  URINALYSIS, ROUTINE W REFLEX MICROSCOPIC    EKG EKG Interpretation  Date/Time:  Monday June 23 2022 14:37:58 EDT Ventricular Rate:  73 PR Interval:  186 QRS Duration: 110 QT Interval:  374 QTC Calculation: 413 R Axis:   32 Text Interpretation: Sinus rhythm No significant change since last tracing Confirmed by Leanord Asal ([id number]) on 06/23/2022 3:02:01 PM  Radiology CT MAXILLOFACIAL W CONTRAST  Result Date: 06/23/2022 CLINICAL DATA:  Trauma EXAM: CT MAXILLOFACIAL WITH CONTRAST TECHNIQUE: Multidetector CT imaging of the maxillofacial structures was performed with intravenous contrast. Multiplanar CT image reconstructions were also generated. RADIATION DOSE REDUCTION: This exam was performed according to the departmental dose-optimization program which includes automated exposure control, adjustment of the mA and/or kV according to patient size and/or use of iterative reconstruction technique. CONTRAST:  80m OMNIPAQUE IOHEXOL 350 MG/ML SOLN  COMPARISON:  None Available. FINDINGS: Osseous: Postsurgical changes from prior left pterional craniotomy. Partially visualized cervical spinal fusion hardware in place. Orbits: Negative. No traumatic or inflammatory finding. Bilateral lens replacements. Sinuses: Trace mucosal thickening of the floor of bilateral maxillary sinuses and the bilateral frontal sinuses. Soft tissues: There is mild soft tissue stranding over the left pterional craniotomy site (Series 7, image 51). Limited intracranial: Please see separately dictated CT head and neck angiogram for additional findings including moderate to severe stenosis in the distal petrous segment  of the left ICA (series 5, image 65) IMPRESSION: 1. No evidence of facial bone fracture. 2. Mild soft tissue stranding over the left pterional craniotomy site, which is nonspecific but could represent a small amount of contusion, postsurgical change, or possible infection; correlate with physical exam. 3. 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 Please see separately dictated CT head and neck angiogram for additional findings. Electronically Signed   By: Marin Roberts M.D.   On: 06/23/2022 15:16   CT ANGIO HEAD NECK W WO CM (CODE STROKE)  Result Date: 06/23/2022 CLINICAL DATA:  Stroke, follow up EXAM: CT ANGIOGRAPHY HEAD AND NECK TECHNIQUE: Multidetector CT imaging of the head and neck was performed using the standard protocol during bolus administration of intravenous contrast. Multiplanar CT image reconstructions and MIPs were obtained to evaluate the vascular anatomy. Carotid stenosis measurements (when applicable) are obtained utilizing NASCET criteria, using the distal internal carotid diameter as the denominator. RADIATION DOSE REDUCTION: This exam was performed according to the departmental dose-optimization program which includes automated exposure control, adjustment of the mA and/or kV according to patient size and/or use of  iterative reconstruction technique. CONTRAST:  23m OMNIPAQUE IOHEXOL 350 MG/ML SOLN COMPARISON:  Same day CT head. FINDINGS: CTA NECK FINDINGS Aortic arch: Great vessel origins are patent without significant stenosis. Right carotid system: No evidence of dissection, stenosis (50% or greater), or occlusion. Left carotid system: No evidence of dissection, stenosis (50% or greater), or occlusion. Vertebral arteries: Left dominant. No evidence of dissection, stenosis (50% or greater), or occlusion. Mild right vertebral artery origin stenosis. Skeleton: Please see concurrent CT of the cervical spine for further evaluation of the cervical spine. Other neck: No acute findings. Upper chest: Visualized lung apices are clear. Review of the MIP images confirms the above findings CTA HEAD FINDINGS Anterior circulation: Bilateral intracranial ICAs are patent. 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 Bilateral MCAs are patent. Mild left M1 MCA stenosis. Bilateral ACAs are patent without proximal hemodynamically significant stenosis. Posterior circulation: Bilateral intradural vertebral arteries, basilar artery and bilateral posterior cerebral arteries are patent without proximal hemodynamically significant stenosis. Venous sinuses: Limited evaluation due to arterial contrast timing. Review of the MIP images confirms the above findings IMPRESSION: 1. No emergent large vessel occlusion. 2. 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 Electronically Signed   By: FMargaretha SheffieldM.D.   On: 06/23/2022 15:12   CT C-SPINE NO CHARGE  Result Date: 06/23/2022 CLINICAL DATA:  Fall EXAM: CT Cervical Spine without contrast TECHNIQUE: Multiplanar CT images of the cervical spine were reconstructed from contemporary CT of the Neck. RADIATION DOSE REDUCTION: This exam was performed according to the departmental dose-optimization program which includes automated  exposure control, adjustment of the mA and/or kV according to patient size and/or use of iterative reconstruction technique. CONTRAST:  None or No additional COMPARISON:  Radiograph 03/17/2013 FINDINGS: Alignment: Straightening of the cervical spine. Trace retrolisthesis C3 on C4. Facet alignment within normal limits. Skull base and vertebrae: No acute fracture. No primary bone lesion or focal pathologic process. Soft tissues and spinal canal: No prevertebral fluid or swelling. No visible canal hematoma. Disc levels: Anterior fusion changes C5 through C7, interbody device at C5-C6. Moderate severe disc space narrowing and degenerative change C2-C3 and C3-C4. Moderate bilateral foraminal narrowing at these levels due to spurring. Upper chest: Negative. Other: None IMPRESSION: Postsurgical changes C5 through C7. No definite  acute osseous abnormality. Electronically Signed   By: Donavan Foil M.D.   On: 06/23/2022 15:10   CT HEAD CODE STROKE WO CONTRAST  Result Date: 06/23/2022 CLINICAL DATA:  Code stroke. EXAM: CT HEAD WITHOUT CONTRAST TECHNIQUE: Contiguous axial images were obtained from the base of the skull through the vertex without intravenous contrast. RADIATION DOSE REDUCTION: This exam was performed according to the departmental dose-optimization program which includes automated exposure control, adjustment of the mA and/or kV according to patient size and/or use of iterative reconstruction technique. COMPARISON:  CT head 05/07/2022. FINDINGS: Brain: Similar versus slight progression of hypoattenuation involving the anterior left temporal lobe, left insula and operculum, and right basal ganglia and anterior temporal lobe further characterized on prior MRI. No evidence of superimposed acute large vascular territory infarct or acute hemorrhage. Vascular: No definite hyperdense vessel identified. Skull: No acute fracture. Sinuses/Orbits: Largely clear sinuses.  No acute orbital findings. Other: No mastoid  effusions. IMPRESSION: 1. No definite evidence of acute large vascular territory infarct or acute hemorrhage. 2. Similar versus slight progression infiltrative abnormality in the left cerebrum, better characterized on prior MRI. A repeat MRI with contrast could better characterize and assess for progression if clinically warranted. Code stroke imaging results were communicated on 06/23/2022 at 2:25 pm to provider Dr. Erlinda Hong via secure text paging. Electronically Signed   By: Margaretha Sheffield M.D.   On: 06/23/2022 14:25    Procedures Procedures    Medications Ordered in ED Medications  levETIRAcetam (KEPPRA) IVPB 1500 mg/ 100 mL premix (0 mg Intravenous Stopped 06/23/22 1514)  lacosamide (VIMPAT) 200 mg in sodium chloride 0.9 % 25 mL IVPB (0 mg Intravenous Stopped 06/23/22 1530)  dexamethasone (DECADRON) injection 4 mg (4 mg Intravenous Given 06/23/22 1442)  iohexol (OMNIPAQUE) 350 MG/ML injection 100 mL (75 mLs Intravenous Contrast Given 06/23/22 1426)    ED Course/ Medical Decision Making/ A&P Clinical Course as of 06/23/22 1630  Mon Jun 23, 2022  1531 CT imaging with moderate stenosis of L ICA, otherwise negative for acute traumatic injury. [VK]  11 Dr. Erlinda Hong of neurology recommends admission for further work up including MRI and EEG. He recommends continuing Keppra and Decadron and increasing Vimpat. [VK]  1623 Patient signed out to Dr. Lorin Mercy, hospitalist. [VK]    Clinical Course User Index [VK] Kemper Durie, DO                           Medical Decision Making Amount and/or Complexity of Data Reviewed Radiology: ordered.  Risk Decision regarding hospitalization.   Patient seen in triage and activated as code stroke CT maxillofacial and cervical spine added to stroke order set Care discussed with Dr. Erlinda Hong on for neuro Care signed out to Dr. Maylon Peppers       Final Clinical Impression(s) / ED Diagnoses Final diagnoses:  Fall, initial encounter  Abrasion of face, initial  encounter  Right sided weakness  Glioma [facility])    Rx / DC Orders ED Discharge Orders     None         Pattricia Boss, MD 06/23/22 1630

## 2022-06-24 ENCOUNTER — Inpatient Hospital Stay (HOSPITAL_COMMUNITY): Payer: Medicare Other

## 2022-06-24 DIAGNOSIS — D496 Neoplasm of unspecified behavior of brain: Secondary | ICD-10-CM

## 2022-06-24 DIAGNOSIS — W19XXXA Unspecified fall, initial encounter: Secondary | ICD-10-CM | POA: Diagnosis not present

## 2022-06-24 DIAGNOSIS — R569 Unspecified convulsions: Secondary | ICD-10-CM | POA: Diagnosis not present

## 2022-06-24 DIAGNOSIS — Y92009 Unspecified place in unspecified non-institutional (private) residence as the place of occurrence of the external cause: Secondary | ICD-10-CM | POA: Diagnosis not present

## 2022-06-24 LAB — COMPREHENSIVE METABOLIC PANEL
ALT: 61 U/L — ABNORMAL HIGH (ref 0–44)
AST: 23 U/L (ref 15–41)
Albumin: 2.6 g/dL — ABNORMAL LOW (ref 3.5–5.0)
Alkaline Phosphatase: 48 U/L (ref 38–126)
Anion gap: 9 (ref 5–15)
BUN: 27 mg/dL — ABNORMAL HIGH (ref 8–23)
CO2: 19 mmol/L — ABNORMAL LOW (ref 22–32)
Calcium: 8.9 mg/dL (ref 8.9–10.3)
Chloride: 107 mmol/L (ref 98–111)
Creatinine, Ser: 0.87 mg/dL (ref 0.61–1.24)
GFR, Estimated: >60 mL/min (ref >60)
Glucose, Bld: 202 mg/dL — ABNORMAL HIGH (ref 70–99)
Potassium: 4.1 mmol/L (ref 3.5–5.1)
Sodium: 135 mmol/L (ref 135–145)
Total Bilirubin: 0.6 mg/dL (ref 0.3–1.2)
Total Protein: 4.8 g/dL — ABNORMAL LOW (ref 6.5–8.1)

## 2022-06-24 LAB — CBC
HCT: 33.4 % — ABNORMAL LOW (ref 39.0–52.0)
Hemoglobin: 11.3 g/dL — ABNORMAL LOW (ref 13.0–17.0)
MCH: 32.1 pg (ref 26.0–34.0)
MCHC: 33.8 g/dL (ref 30.0–36.0)
MCV: 94.9 fL (ref 80.0–100.0)
Platelets: 174 10*3/uL (ref 150–400)
RBC: 3.52 MIL/uL — ABNORMAL LOW (ref 4.22–5.81)
RDW: 12.7 % (ref 11.5–15.5)
WBC: 10.2 10*3/uL (ref 4.0–10.5)
nRBC: 0 % (ref 0.0–0.2)

## 2022-06-24 LAB — GLUCOSE, CAPILLARY: Glucose-Capillary: 173 mg/dL — ABNORMAL HIGH (ref 70–99)

## 2022-06-24 LAB — CBG MONITORING, ED
Glucose-Capillary: 156 mg/dL — ABNORMAL HIGH (ref 70–99)
Glucose-Capillary: 120 mg/dL — ABNORMAL HIGH (ref 70–99)
Glucose-Capillary: 253 mg/dL — ABNORMAL HIGH (ref 70–99)

## 2022-06-24 MED ORDER — SODIUM CHLORIDE 0.9 % IV SOLN
200.0000 mg | Freq: Once | INTRAVENOUS | Status: AC
  Administered 2022-06-24: 200 mg via INTRAVENOUS
  Filled 2022-06-24: qty 20

## 2022-06-24 MED ORDER — TAMSULOSIN HCL 0.4 MG PO CAPS
0.4000 mg | ORAL_CAPSULE | Freq: Every day | ORAL | Status: DC
  Administered 2022-06-24 – 2022-06-25 (×2): 0.4 mg via ORAL
  Filled 2022-06-24 (×2): qty 1

## 2022-06-24 MED ORDER — DEXAMETHASONE SODIUM PHOSPHATE 10 MG/ML IJ SOLN
8.0000 mg | Freq: Every day | INTRAMUSCULAR | Status: DC
  Administered 2022-06-24 – 2022-06-25 (×2): 8 mg via INTRAVENOUS
  Filled 2022-06-24 (×2): qty 1

## 2022-06-24 MED ORDER — SODIUM CHLORIDE 0.9 % IV BOLUS
500.0000 mL | Freq: Once | INTRAVENOUS | Status: AC
  Administered 2022-06-24: 500 mL via INTRAVENOUS

## 2022-06-24 MED ORDER — CARVEDILOL 6.25 MG PO TABS
6.2500 mg | ORAL_TABLET | Freq: Two times a day (BID) | ORAL | Status: DC
  Administered 2022-06-24 – 2022-06-25 (×3): 6.25 mg via ORAL
  Filled 2022-06-24: qty 1
  Filled 2022-06-24: qty 2
  Filled 2022-06-24: qty 1

## 2022-06-24 MED ORDER — SODIUM CHLORIDE 0.9 % IV SOLN
75.0000 mL/h | INTRAVENOUS | Status: AC
  Administered 2022-06-24: 75 mL/h via INTRAVENOUS

## 2022-06-24 MED ORDER — LOSARTAN POTASSIUM 50 MG PO TABS
25.0000 mg | ORAL_TABLET | Freq: Every day | ORAL | Status: DC
  Administered 2022-06-25: 25 mg via ORAL
  Filled 2022-06-24: qty 1

## 2022-06-24 NOTE — ED Notes (Signed)
Spoke to pharmacy at this time. Second dose of Vimpat ordered per provider for pt due to IV infiltration.

## 2022-06-24 NOTE — ED Notes (Signed)
Warm pack applied to infiltration

## 2022-06-24 NOTE — Progress Notes (Signed)
LTM EEG running - no initial skin breakdown - - neuro notified. [facility] was NOT called, PT is in ED

## 2022-06-24 NOTE — ED Notes (Signed)
Neurology at bedside.

## 2022-06-24 NOTE — ED Notes (Signed)
Pt has not emptied bladder since before arriving to hospital. Bladder scan performed and showed 675 ml.

## 2022-06-24 NOTE — Progress Notes (Signed)
Called because of some worsening.  He definitely seems more confused than he did this morning, his right arm is fairly similar or may be slightly worse distally.  His right leg, he no longer lifts against gravity, but it is hard to tell how much of this is due to confusion/noncompliance and how much of this is actual weakness.  Given the worsening symptoms, and the fact that he has been on 4 mg twice daily of steroids at home but only got a single dose of 4 mg, I would favor an additional steroid dose, also in case edema is playing a role in his focal worsening deficits.    We will continue to monitor on EEG, and continue his current antiepileptic regimen.  Roland Rack, MD [facility] [phone]  If 7pm- 7am, please page neurology on call as listed in [place].

## 2022-06-24 NOTE — Progress Notes (Signed)
PROGRESS NOTE                                                                                                                                                                                                             Patient Demographics:    Jorge [name], is a 76 y.o. male, DOB - October 03, 1945, PJK:[id number]  Outpatient Primary MD for the patient is Gweneth Fritter, FNP    LOS - 1  Admit date - 06/23/2022    Chief Complaint  Patient presents with   Code Stroke       Brief Narrative (HPI from H&P)   76 year old gentleman with history of recently diagnosed brain glioma, seizures, CKD stage 2, DM type II, hypertension who presents after having a fall in his yard while he was walking his dog.  He presented to the ER he was diagnosed with rate through seizure and admitted to the hospital he was seen by neurology.  Of note he is waiting for a [facility] outpatient appointment for a second opinion as well.   Subjective:    Jorge [name] today has, No headache, No chest pain, No abdominal pain - No Nausea, No new weakness tingling or numbness, no shortness of breath, overall still sleepy with right-sided weakness.   Assessment  & Plan :    Breakthrough seizure causing a fall.  Seen by neurology, placed on Vimpat and Keppra, is home Decadron dose has been increased, MRI noted, EEG noted which is nonacute, neurology following defer further management to neurology.  Still has considerable right-sided weakness question Todd's paralysis versus due to underlying tumor.  Right-sided weakness.  Currently see above.  Neurology on board.  MRI nonacute.  Brain glioma.  Under the care of Dr. Mickeal Skinner chatted with him this morning, nothing else to offer, outpatient [facility] follow-up.  Dyslipidemia.  On statin.  Hypertension.  Blood pressure on the softer side.  For now only low-dose Coreg.  Kindly monitor as he is on very high doses of Coreg, Norvasc and  ARB.  CKD stage II.  Stable at baseline.  Urinary retention.  More than 600 cc of urine in the bladder, Foley and Flomax on 06/24/2022.  DM type II.  Long-acting insulin along with sliding scale.  Lab Results  Component Value Date   HGBA1C 7.6 (H) 04/29/2022   CBG (last 3)  Recent Labs    06/23/22 1406 06/23/22 2204 06/24/22 0728  GLUCAP 253* 283* 156*    Lab Results  Component Value Date   CHOL 174 11/19/2017   HDL 36 (L) 11/19/2017   LDLCALC 114 (H) 11/19/2017   TRIG 122 11/19/2017   CHOLHDL 4.8 11/19/2017         Condition - Extremely Guarded  Family Communication  : Son and wife bedside on 06/24/2022  Code Status : Full code  Consults  : Neurology, Dr. Mickeal Skinner  PUD Prophylaxis :     Procedures  :            Disposition Plan  :    Status is: Inpatient  DVT Prophylaxis  :    Place and maintain sequential compression device Start: 06/24/22 0823 SCDs Start: 06/23/22 1800   Lab Results  Component Value Date   PLT 174 06/24/2022    Diet :  Diet Order             Diet Carb Modified Fluid consistency: Thin; Room service appropriate? Yes  Diet effective now                    Inpatient Medications  Scheduled Meds:  busPIRone  5 mg Oral TID   carvedilol  6.25 mg Oral BID   dexamethasone (DECADRON) injection  4 mg Intravenous Daily   docusate sodium  100 mg Oral BID   escitalopram  10 mg Oral Daily   insulin aspart  0-15 Units Subcutaneous TID WC   insulin aspart  0-5 Units Subcutaneous QHS   insulin glargine-yfgn  25 Units Subcutaneous Daily   [START ON 06/25/2022] losartan  25 mg Oral Daily   simvastatin  40 mg Oral QHS   tamsulosin  0.4 mg Oral Daily   Continuous Infusions:  sodium chloride     lacosamide (VIMPAT) IV Stopped (06/24/22 0026)   levETIRAcetam Stopped (06/23/22 2201)   PRN Meds:.acetaminophen **OR** acetaminophen, LORazepam, ondansetron **OR** ondansetron (ZOFRAN) IV, mouth rinse, polyethylene  glycol  Antibiotics  :    Anti-infectives (From admission, onward)    None         Objective:   Vitals:   06/24/22 0600 06/24/22 0630 06/24/22 0700 06/24/22 0729  BP: 131/73 134/68 139/72   Pulse: (!) 59 (!) 58 (!) 57   Resp: '10 10 11   '$ Temp:    (!) 96.4 F (35.8 C)  TempSrc:    Axillary  SpO2: 98% 97% 98%     Wt Readings from Last 3 Encounters:  05/26/22 85 kg  05/20/22 85.4 kg  05/06/22 90 kg     Intake/Output Summary (Last 24 hours) at 06/24/2022 0823 Last data filed at 06/24/2022 0026 Gross per 24 hour  Intake 625 ml  Output --  Net 625 ml     Physical Exam  Awake Alert, No new F.N deficits, Normal affect Jorge [name].AT,PERRAL Supple Neck, No JVD,   Symmetrical Chest wall movement, Good air movement bilaterally, CTAB RRR,No Gallops,Rubs or new Murmurs,  +ve B.Sounds, Abd Soft, No tenderness,   No Cyanosis, Clubbing or edema     RN pressure injury documentation:      Data Review:    CBC Recent Labs  Lab 06/23/22 1403 06/23/22 1435 06/24/22 0310  WBC 12.7*  --  10.2  HGB 12.3* 11.9* 11.3*  HCT 35.7* 35.0* 33.4*  PLT 215  --  174  MCV 92.7  --  94.9  MCH 31.9  --  32.1  MCHC 34.5  --  33.8  RDW 12.4  --  12.7  LYMPHSABS 1.4  --   --   MONOABS 0.6  --   --   EOSABS 0.0  --   --   BASOSABS 0.0  --   --     Electrolytes Recent Labs  Lab 06/23/22 1403 06/23/22 1435 06/24/22 0310  NA 136 134* 135  K 3.9 3.9 4.1  CL 103 100 107  CO2 22  --  19*  GLUCOSE 262* 263* 202*  BUN 25* 27* 27*  CREATININE 1.13 1.00 0.87  CALCIUM 9.0  --  8.9  AST 36  --  23  ALT 75*  --  61*  ALKPHOS 55  --  48  BILITOT 0.5  --  0.6  ALBUMIN 3.0*  --  2.6*  INR 1.0  --   --     ------------------------------------------------------------------------------------------------------------------ No results for input(s): "CHOL", "HDL", "LDLCALC", "TRIG", "CHOLHDL", "LDLDIRECT" in the last 72 hours.  Lab Results  Component Value Date   HGBA1C 7.6 (H)  04/29/2022    No results for input(s): "TSH", "T4TOTAL", "T3FREE", "THYROIDAB" in the last 72 hours.  Invalid input(s): "FREET3" ------------------------------------------------------------------------------------------------------------------ ID Labs Recent Labs  Lab 06/23/22 1403 06/23/22 1435 06/24/22 0310  WBC 12.7*  --  10.2  PLT 215  --  174  CREATININE 1.13 1.00 0.87   Cardiac Enzymes No results for input(s): "CKMB", "TROPONINI", "MYOGLOBIN" in the last 168 hours.  Invalid input(s): "CK"       Micro Results No results found for this or any previous visit (from the past 240 hour(s)).  Radiology Reports MR BRAIN W WO CONTRAST  Result Date: 06/23/2022 CLINICAL DATA:  Seizure disorder, brain tumor EXAM: MRI HEAD WITHOUT AND WITH CONTRAST TECHNIQUE: Multiplanar, multiecho pulse sequences of the brain and surrounding structures were obtained without and with intravenous contrast. CONTRAST:  52m GADAVIST GADOBUTROL 1 MMOL/ML IV SOLN COMPARISON:  05/01/2022 FINDINGS: Brain: Redemonstrated nonenhancing, masslike, T2 hyperintense signal in the left-greater-than-right frontal and temporal lobes, which appears overall unchanged from the prior exam. This causes mild mass effect on the left lateral ventricle, unchanged. No restricted diffusion to suggest acute or subacute infarct. No acute hemorrhage or midline shift. No hydrocephalus or extra-axial collection. No abnormal enhancement. Abnormal signal is noted to involve the left-greater-than-right hippocampus. No heterotopia or evidence of cortical dysgenesis. Vascular: Normal arterial flow voids. Normal arterial and venous enhancement. Skull and upper cervical spine: Normal marrow signal. Left temporal burr hole. Sinuses/Orbits: Mild mucosal thickening in the paranasal sinuses. Status post bilateral lens replacements. Other: Fluid in the right mastoid air cells. IMPRESSION: 1. 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 2. No definite seizure etiology identified, although both hippocampi are involved in the masslike T2 hyperintense signal. 3. No acute intracranial process. Electronically Signed   By: AMerilyn BabaM.D.   On: 06/23/2022 19:29   EEG adult  Result Date: 06/23/2022 YLora Havens MD     06/23/2022  6:26 PM Patient Name: TPatrich HeinzeMRN: [id number] Attending: PLora HavensReferring Physician/Provider: XRosalin Hawking MD Date: 06/23/2022 Duration: 23.10 mins Patient history: 76year old male with history of glioma, seizures who presented with altered mental status.  EEG to evaluate for seizure. Level of alertness: Awake AEDs during EEG study: LEV, LCM Technical aspects: This EEG study was done with scalp electrodes  positioned according to the 10-20 International system of electrode placement. Electrical activity was reviewed with band pass filter of 1-'70Hz'$ , sensitivity of 7 uV/mm, display speed of 46m/sec with a '60Hz'$  notched filter applied as appropriate. EEG data were recorded continuously and digitally stored.  Video monitoring was available and reviewed as appropriate. Description:  No clear posterior dominant rhythm was seen. EEG showed continuous generalized 3 to 6 Hz theta-delta slowing admixed with 12-'14Hz'$  beta activity. Hyperventilation and photic stimulation were not performed.    ABNORMALITY - Continuous slow, generalized  IMPRESSION: This technically difficult study is suggestive of moderate diffuse encephalopathy, nonspecific etiology. No seizures or epileptiform discharges were seen throughout the recording.  A normal interictal EEG does not exclude the diagnosis of epilepsy.   Priyanka OBarbra Sarks  CT MAXILLOFACIAL W CONTRAST  Result Date: 06/23/2022 CLINICAL DATA:  Trauma EXAM: CT MAXILLOFACIAL WITH CONTRAST  TECHNIQUE: Multidetector CT imaging of the maxillofacial structures was performed with intravenous contrast. Multiplanar CT image reconstructions were also generated. RADIATION DOSE REDUCTION: This exam was performed according to the departmental dose-optimization program which includes automated exposure control, adjustment of the mA and/or kV according to patient size and/or use of iterative reconstruction technique. CONTRAST:  721mOMNIPAQUE IOHEXOL 350 MG/ML SOLN COMPARISON:  None Available. FINDINGS: Osseous: Postsurgical changes from prior left pterional craniotomy. Partially visualized cervical spinal fusion hardware in place. Orbits: Negative. No traumatic or inflammatory finding. Bilateral lens replacements. Sinuses: Trace mucosal thickening of the floor of bilateral maxillary sinuses and the bilateral frontal sinuses. Soft tissues: There is mild soft tissue stranding over the left pterional craniotomy site (Series 7, image 51). 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 No evidence of facial bone fracture. 2. Mild soft tissue stranding over the left pterional craniotomy site, which is nonspecific but could represent a small amount of contusion, postsurgical change, or possible infection; correlate with physical exam. 3. 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 Please see separately dictated CT head and neck angiogram for additional findings. Electronically Signed   By: HeMarin Roberts.D.   On: 06/23/2022 15:16   CT ANGIO HEAD NECK W WO CM (CODE STROKE)  Result Date: 06/23/2022 CLINICAL DATA:  Stroke, follow up EXAM: CT ANGIOGRAPHY HEAD AND NECK TECHNIQUE: Multidetector CT imaging of the head and neck was performed using the standard protocol during bolus administration of intravenous  contrast. Multiplanar CT image reconstructions and MIPs were obtained to evaluate the vascular anatomy. Carotid stenosis measurements (when applicable) are obtained utilizing NASCET criteria, using the distal internal carotid diameter as the denominator. RADIATION DOSE REDUCTION: This exam was performed according to the departmental dose-optimization program which includes automated exposure control, adjustment of the mA and/or kV according to patient size and/or use of iterative reconstruction technique. CONTRAST:  7577mMNIPAQUE IOHEXOL 350 MG/ML SOLN COMPARISON:  Same day CT head. FINDINGS: CTA NECK FINDINGS Aortic arch: Great vessel origins are patent without significant stenosis. Right carotid system: No evidence of dissection, stenosis (50% or greater), or occlusion. Left carotid system: No evidence of dissection, stenosis (50% or greater), or occlusion. Vertebral arteries: Left dominant. No evidence of dissection, stenosis (50% or greater), or occlusion. Mild right vertebral artery origin stenosis. Skeleton: Please see concurrent CT of the cervical spine for further evaluation of the cervical spine. Other neck: No acute  findings. Upper chest: Visualized lung apices are clear. Review of the MIP images confirms the above findings CTA HEAD FINDINGS Anterior circulation: Bilateral intracranial ICAs are patent. 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 Bilateral MCAs are patent. Mild left M1 MCA stenosis. Bilateral ACAs are patent without proximal hemodynamically significant stenosis. Posterior circulation: Bilateral intradural vertebral arteries, basilar artery and bilateral posterior cerebral arteries are patent without proximal hemodynamically significant stenosis. Venous sinuses: Limited evaluation due to arterial contrast timing. Review of the MIP images confirms the above findings IMPRESSION: 1. No emergent large vessel occlusion. 2. 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 Electronically Signed   By: Margaretha Sheffield M.D.   On: 06/23/2022 15:12   CT C-SPINE NO CHARGE  Result Date: 06/23/2022 CLINICAL DATA:  Fall EXAM: CT Cervical Spine without contrast TECHNIQUE: Multiplanar CT images of the cervical spine were reconstructed from contemporary CT of the Neck. RADIATION DOSE REDUCTION: This exam was performed according to the departmental dose-optimization program which includes automated exposure control, adjustment of the mA and/or kV according to patient size and/or use of iterative reconstruction technique. CONTRAST:  None or No additional COMPARISON:  Radiograph 03/17/2013 FINDINGS: Alignment: Straightening of the cervical spine. Trace retrolisthesis C3 on C4. Facet alignment within normal limits. Skull base and vertebrae: No acute fracture. No primary bone lesion or focal pathologic process. Soft tissues and spinal canal: No prevertebral fluid or swelling. No visible canal hematoma. Disc levels: Anterior fusion changes C5 through C7, interbody device at C5-C6. Moderate severe disc space narrowing and degenerative change C2-C3 and C3-C4. Moderate bilateral foraminal narrowing at these levels due to spurring. Upper chest: Negative. Other: None IMPRESSION: Postsurgical changes C5 through C7. No definite acute osseous abnormality. Electronically Signed   By: Donavan Foil M.D.   On: 06/23/2022 15:10   CT HEAD CODE STROKE WO CONTRAST  Result Date: 06/23/2022 CLINICAL DATA:  Code stroke. EXAM: CT HEAD WITHOUT CONTRAST TECHNIQUE: Contiguous axial images were obtained from the base of the skull through the vertex without intravenous contrast. RADIATION DOSE REDUCTION: This exam was performed according to the departmental dose-optimization program which includes automated exposure control, adjustment of the mA and/or kV according to patient size and/or use of iterative reconstruction technique. COMPARISON:  CT head 05/07/2022.  FINDINGS: Brain: Similar versus slight progression of hypoattenuation involving the anterior left temporal lobe, left insula and operculum, and right basal ganglia and anterior temporal lobe further characterized on prior MRI. No evidence of superimposed acute large vascular territory infarct or acute hemorrhage. Vascular: No definite hyperdense vessel identified. Skull: No acute fracture. Sinuses/Orbits: Largely clear sinuses.  No acute orbital findings. Other: No mastoid effusions. IMPRESSION: 1. No definite evidence of acute large vascular territory infarct or acute hemorrhage. 2. Similar versus slight progression infiltrative abnormality in the left cerebrum, better characterized on prior MRI. A repeat MRI with contrast could better characterize and assess for progression if clinically warranted. Code stroke imaging results were communicated on 06/23/2022 at 2:25 pm to provider Dr. Erlinda Hong via secure text paging. Electronically Signed   By: Margaretha Sheffield M.D.   On: 06/23/2022 14:25      Signature  Lala Lund M.D on 06/24/2022 at 8:23 AM   -  To page go to [email or web address]

## 2022-06-24 NOTE — ED Notes (Signed)
Provider notified of drop in BP, ordered 534m bolus

## 2022-06-24 NOTE — ED Notes (Signed)
Spoke with son at bedside about pt NIH and Neuro assessment score change. Pt son reports the pt presentation being unchanged post fall that occurred PTA for the ER visit.

## 2022-06-24 NOTE — Progress Notes (Signed)
Subjective: He still has some right-sided weakness and some confusion, may be slightly improved from yesterday  Exam: Vitals:   06/24/22 0700 06/24/22 0729  BP: 139/72   Pulse: (!) 57   Resp: 11   Temp:  (!) 96.4 F (35.8 C)  SpO2: 98%    Gen: In bed, NAD Resp: non-labored breathing, no acute distress Abd: soft, nt  Neuro: MS: Awake, alert, he is able to answer some simple questions but not able to answer the year.  He has some perseveration when I am doing naming. CN: Visual fields are full,?  Mild right-sided visual neglect, EOMI, right pupil is eccentric due to previous injury.  He does not participate very well with facial weakness, but no definite decreased nasolabial fold Motor: He has 4 -/5 weakness of the right arm proximally and 3/5 distally, 4+/5 in the right leg Sensory: He reports symmetric sensation   Impression: 76 year old male with persistent right-sided weakness after presumed seizure yesterday.  Given his persistent confusion, with perseveration, I am concerned that he is having intermittent seizures and will plan to do continuous EEG to see if he is having intermittent seizures.  For now I will continue the Vimpat and Keppra at increased doses  Recommendations: 1) continue Vimpat 200 mg twice daily (increased from 100 twice daily) 2) continue Keppra 1500 twice daily (increased from home 1 g twice daily) 3) overnight EEG 4) neurology will follow  Roland Rack, MD [facility] [phone]  If 7pm- 7am, please page neurology on call as listed in [facility].

## 2022-06-24 NOTE — Progress Notes (Signed)
PT Cancellation Note  Patient Details Name: Jorge [name] MRN: [id number] DOB: Jun 13, 1946   Cancelled Treatment:    Reason Eval/Treat Not Completed: Patient at procedure or test/unavailable Pt currently getting EEG. Will follow up as schedule allows.   Lou Miner, DPT  Acute Rehabilitation Services  Office: [phone]    [name] 06/24/2022, 10:52 AM

## 2022-06-25 ENCOUNTER — Inpatient Hospital Stay (HOSPITAL_COMMUNITY): Payer: Medicare Other

## 2022-06-25 DIAGNOSIS — E1122 Type 2 diabetes mellitus with diabetic chronic kidney disease: Secondary | ICD-10-CM

## 2022-06-25 DIAGNOSIS — W19XXXA Unspecified fall, initial encounter: Secondary | ICD-10-CM | POA: Diagnosis not present

## 2022-06-25 DIAGNOSIS — N1832 Chronic kidney disease, stage 3b: Secondary | ICD-10-CM

## 2022-06-25 DIAGNOSIS — G40909 Epilepsy, unspecified, not intractable, without status epilepticus: Secondary | ICD-10-CM | POA: Diagnosis not present

## 2022-06-25 DIAGNOSIS — Y92009 Unspecified place in unspecified non-institutional (private) residence as the place of occurrence of the external cause: Secondary | ICD-10-CM | POA: Diagnosis not present

## 2022-06-25 DIAGNOSIS — D496 Neoplasm of unspecified behavior of brain: Secondary | ICD-10-CM | POA: Diagnosis not present

## 2022-06-25 LAB — GLUCOSE, CAPILLARY
Glucose-Capillary: 151 mg/dL — ABNORMAL HIGH (ref 70–99)
Glucose-Capillary: 370 mg/dL — ABNORMAL HIGH (ref 70–99)
Glucose-Capillary: 312 mg/dL — ABNORMAL HIGH (ref 70–99)
Glucose-Capillary: 238 mg/dL — ABNORMAL HIGH (ref 70–99)

## 2022-06-25 MED ORDER — CARVEDILOL 6.25 MG PO TABS: 6.2500 mg | ORAL_TABLET | Freq: Two times a day (BID) | ORAL | Status: AC

## 2022-06-25 MED ORDER — VALPROATE SODIUM 100 MG/ML IV SOLN
2000.0000 mg | Freq: Once | INTRAVENOUS | Status: AC
  Administered 2022-06-25: 2000 mg via INTRAVENOUS
  Filled 2022-06-25: qty 20

## 2022-06-25 MED ORDER — POLYETHYLENE GLYCOL 3350 17 G PO PACK: 17.0000 g | PACK | Freq: Every day | ORAL | 0 refills | Status: AC | PRN

## 2022-06-25 MED ORDER — VALPROATE SODIUM 100 MG/ML IV SOLN: 750.0000 mg | Freq: Three times a day (TID) | INTRAVENOUS | Status: DC

## 2022-06-25 MED ORDER — LOSARTAN POTASSIUM 25 MG PO TABS: 25.0000 mg | ORAL_TABLET | Freq: Every day | ORAL | Status: AC

## 2022-06-25 MED ORDER — VALPROATE SODIUM 100 MG/ML IV SOLN: 750.0000 mg | Freq: Three times a day (TID) | INTRAVENOUS | Status: AC

## 2022-06-25 MED ORDER — DOCUSATE SODIUM 100 MG PO CAPS: 100.0000 mg | ORAL_CAPSULE | Freq: Two times a day (BID) | ORAL | 0 refills | Status: AC

## 2022-06-25 MED ORDER — VALPROATE SODIUM 100 MG/ML IV SOLN
750.0000 mg | Freq: Three times a day (TID) | INTRAVENOUS | Status: DC
  Administered 2022-06-25: 750 mg via INTRAVENOUS
  Filled 2022-06-25 (×3): qty 7.5

## 2022-06-25 MED ORDER — LEVETIRACETAM IN NACL 1500 MG/100ML IV SOLN: 1500.0000 mg | Freq: Two times a day (BID) | INTRAVENOUS | Status: AC

## 2022-06-25 MED ORDER — INSULIN ASPART 100 UNIT/ML IJ SOLN: 0.0000 [IU] | Freq: Three times a day (TID) | INTRAMUSCULAR | 11 refills | Status: AC

## 2022-06-25 MED ORDER — INSULIN ASPART 100 UNIT/ML IJ SOLN: 0.0000 [IU] | Freq: Every day | INTRAMUSCULAR | 11 refills | Status: AC

## 2022-06-25 MED ORDER — ORAL CARE MOUTH RINSE: 15.0000 mL | OROMUCOSAL | 0 refills | Status: AC | PRN

## 2022-06-25 MED ORDER — SODIUM CHLORIDE 0.9 % IV SOLN: 200.0000 mg | Freq: Two times a day (BID) | INTRAVENOUS | Status: AC

## 2022-06-25 MED ORDER — ONDANSETRON HCL 4 MG PO TABS: 4.0000 mg | ORAL_TABLET | Freq: Four times a day (QID) | ORAL | 0 refills | Status: AC | PRN

## 2022-06-25 MED ORDER — DEXAMETHASONE SODIUM PHOSPHATE 10 MG/ML IJ SOLN: 8.0000 mg | Freq: Every day | INTRAMUSCULAR | 0 refills | Status: AC

## 2022-06-25 MED ORDER — LORAZEPAM 2 MG/ML IJ SOLN: 4.0000 mg | INTRAMUSCULAR | 0 refills | Status: AC | PRN

## 2022-06-25 MED ORDER — TAMSULOSIN HCL 0.4 MG PO CAPS: 0.4000 mg | ORAL_CAPSULE | Freq: Every day | ORAL | Status: AC

## 2022-06-25 NOTE — Evaluation (Signed)
Physical Therapy Evaluation Patient Details Name: Jorge [name] MRN: [id number] DOB: [birth year] Today's Date: 06/25/2022  History of Present Illness  Patient is a 76 yo male presenting to the ED status post fall with R sided weakness on 06/23/22.  MRI showing no acute changes. Recently diagnosed with glioma, having siezures, EEG shows seizure and moderate encephalopathy on 10/17. PMH includes:  3 CKD; DM; and HTN  Clinical Impression   Pt presents with generalized weakness with focal R-sided weakness, increased RLE tone, R inattention, impaired balance, impaired cognition, and poor activity tolerance. Pt to benefit from acute PT to address deficits. Pt requiring total +2 assist for to/from EOB, pt is independent with mobility at baseline. PT to progress mobility as tolerated, and will continue to follow acutely.         Recommendations for follow up therapy are one component of a multi-disciplinary discharge planning process, led by the attending physician.  Recommendations may be updated based on patient status, additional functional criteria and insurance authorization.  Follow Up Recommendations Acute inpatient rehab (3hours/day)      Assistance Recommended at Discharge Frequent or constant Supervision/Assistance  Patient can return home with the following  Two people to help with walking and/or transfers;Two people to help with bathing/dressing/bathroom    Equipment Recommendations Other (comment) (defer)  Recommendations for Other Services       Functional Status Assessment Patient has had a recent decline in their functional status and demonstrates the ability to make significant improvements in function in a reasonable and predictable amount of time.     Precautions / Restrictions Precautions Precautions: Fall Restrictions Weight Bearing Restrictions: No      Mobility  Bed Mobility Overal bed mobility: Needs Assistance Bed Mobility: Supine to Sit, Sit to Supine      Supine to sit: Total assist, +2 for physical assistance, +2 for safety/equipment Sit to supine: Total assist, +2 for physical assistance, +2 for safety/equipment   General bed mobility comments: Minimal to no activation noted with multi-modal cues to transition to EOB, support provided at BLEs and trunk (increased tone noted in RLE), R lateral lean when seated EOB, patient did not attempt to correct with lessened support, total to assist trunk and BLEs back into bed    Transfers                   General transfer comment: deferred due to poor sitting balance and minimal command following    Ambulation/Gait                  Stairs            Wheelchair Mobility    Modified Rankin (Stroke Patients Only)       Balance Overall balance assessment: Needs assistance Sitting-balance support: Bilateral upper extremity supported, Feet supported Sitting balance-Leahy Scale: Zero Sitting balance - Comments: assist provided at trunk by PT/OT to remain upright Postural control: Right lateral lean     Standing balance comment: deferred due to current cognitive state                             Pertinent Vitals/Pain Pain Assessment Pain Assessment: Faces Faces Pain Scale: No hurt Pain Intervention(s): Monitored during session    Home Living Family/patient expects to be discharged to:: Private residence Living Arrangements: Spouse/significant other Available Help at Discharge: Family;Available 24 hours/day Type of Home: House Home Access: Stairs to enter Entrance Stairs-Rails: None Entrance  Stairs-Number of Steps: 1   Home Layout: One level Home Equipment: None      Prior Function Prior Level of Function : Independent/Modified Independent             Mobility Comments: pt hasn't driven for 2 months ADLs Comments: Pt's wife does cooking and cleaning, otherwise pt independent with ADLs at baseline     Hand Dominance   Dominant Hand:  Right    Extremity/Trunk Assessment   Upper Extremity Assessment Upper Extremity Assessment: Defer to OT evaluation RUE Deficits / Details: decreased AROM in RUE, unable to squeeze hand on command, but activation noted at shoulder and elbow when handed a washcloth RUE Sensation: decreased proprioception RUE Coordination: decreased fine motor;decreased gross motor    Lower Extremity Assessment Lower Extremity Assessment: Generalized weakness;RLE deficits/detail;Difficult to assess due to impaired cognition RLE Deficits / Details: presents with hip adduction hypertonicity, can perform limited ROM knee extension but cannot formally assess strength given cognition    Cervical / Trunk Assessment Cervical / Trunk Assessment: Other exceptions Cervical / Trunk Exceptions: R lateral lean/inattention  Communication   Communication: No difficulties  Cognition Arousal/Alertness: Lethargic, Suspect due to medications Behavior During Therapy: Flat affect Overall Cognitive Status: Impaired/Different from baseline Area of Impairment: Orientation, Attention, Memory, Following commands, Safety/judgement, Awareness, Problem solving                 Orientation Level: Place, Situation, Time, Disoriented to Current Attention Level: Focused Memory: Decreased recall of precautions, Decreased short-term memory Following Commands: Follows one step commands inconsistently, Follows one step commands with increased time Safety/Judgement: Decreased awareness of deficits, Decreased awareness of safety Awareness: Intellectual Problem Solving: Slow processing, Decreased initiation, Difficulty sequencing, Requires verbal cues, Requires tactile cues General Comments: Patient with significant lethargy (likely due to medications and post-itcal state), often stating "yeah" to any question posed, unable to state wife's name or respond to other questions posed appropriately. Pt attending much more to L vs R, appears  internally distracted and at times distracted by environment        General Comments General comments (skin integrity, edema, etc.): vss    Exercises     Assessment/Plan    PT Assessment Patient needs continued PT services  PT Problem List Decreased strength;Decreased mobility;Decreased balance;Decreased knowledge of use of DME;Pain;Decreased activity tolerance;Decreased cognition;Impaired tone;Decreased safety awareness       PT Treatment Interventions Therapeutic activities;DME instruction;Gait training;Therapeutic exercise;Patient/family education;Balance training;Functional mobility training;Neuromuscular re-education    PT Goals (Current goals can be found in the Care Plan section)  Acute Rehab PT Goals PT Goal Formulation: With patient/family Time For Goal Achievement: 07/09/22 Potential to Achieve Goals: Good    Frequency Min 3X/week     Co-evaluation PT/OT/SLP Co-Evaluation/Treatment: Yes Reason for Co-Treatment: For patient/therapist safety;Complexity of the patient's impairments (multi-system involvement);Necessary to address cognition/behavior during functional activity;To address functional/ADL transfers PT goals addressed during session: Mobility/safety with mobility;Balance OT goals addressed during session: ADL's and self-care;Strengthening/ROM       AM-PAC PT "6 Clicks" Mobility  Outcome Measure Help needed turning from your back to your side while in a flat bed without using bedrails?: Total Help needed moving from lying on your back to sitting on the side of a flat bed without using bedrails?: Total Help needed moving to and from a bed to a chair (including a wheelchair)?: Total Help needed standing up from a chair using your arms (e.g., wheelchair or bedside chair)?: Total Help needed to walk in hospital room?: Total  Help needed climbing 3-5 steps with a railing? : Total 6 Click Score: 6    End of Session   Activity Tolerance: Patient limited by  fatigue;Patient limited by lethargy Patient left: in bed;with call bell/phone within reach;with bed alarm set;with family/visitor present Nurse Communication: Mobility status PT Visit Diagnosis: Other abnormalities of gait and mobility (R26.89);Muscle weakness (generalized) (M62.81)    Time: 1410-1440 PT Time Calculation (min) (ACUTE ONLY): 30 min   Charges:   PT Evaluation $PT Eval Moderate Complexity: 1 Mod        Zachry Hopfensperger S, PT DPT Acute Rehabilitation Services Pager [phone]  Office [phone]   Roxine Caddy E Ruffin Pyo 06/25/2022, 3:57 PM

## 2022-06-25 NOTE — Progress Notes (Signed)
Subjective: Continues to have right-sided weakness and aphasia  Exam: Vitals:   06/25/22 0323 06/25/22 0800  BP: 115/79 131/69  Pulse: (!) 53 62  Resp: 18 13  Temp: 98.4 F (36.9 C) 98.7 F (37.1 C)  SpO2: 99% 97%   Gen: In bed, NAD Resp: non-labored breathing, no acute distress Abd: soft, nt  Neuro: MS: Awake, alert, he has significant aphasia with perseveration, worse than yesterday. CN: Pupils are reactive bilaterally, extraocular movements are intact, he appears to respond to visual stimuli in both hemifield's. Motor: He is able to lift his right arm against gravity, but with distal 1/5 grip strength.  With prompting, he is able to lift his leg at his knee, 2-3/5 at the hip Sensory: He reports symmetric sensation, though I am not confident of the reliability of this answer.  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  Initial MRI negative for any type of stroke or acute change of his lesion.  Biopsy of this lesion was indeterminate with evidence of nonspecific inflammation.  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  He was supposed to be evaluated at [facility] in the oncology clinic, but is missing his appointment because of the current process.   He did have a single seizure overnight, but this is on the right whereas his deficits are primarily on the right body, left brain which would be the opposite side if seizure was causing his symptoms.  We did give an additional steroid dose yesterday, without significant response.  I would favor repeat imaging, and will include an MRI of his cervical spine though given his speech difficulty I think it is more likely localizing to the brain.  For  his breakthrough seizure, I will add Depacon at this time.  Recommendations: 1) Depacon 2 g followed by 750 3 times daily 2) Keppra 1500 twice daily 3) Vimpat 200 twice daily 4) MRI brain and cervical spine 5) would continue LTM EEG  6) especially with him already having been referred to [facility], I think consideration of transfer for possibility of rebiopsy versus empiric treatment for inflammatory conditions may need to be considered.  Roland Rack, MD [facility] [phone]  If 7pm- 7am, please page neurology on call as listed in [place].

## 2022-06-25 NOTE — Evaluation (Signed)
Clinical/Bedside Swallow Evaluation Patient Details  Name: Jorge [name] MRN: [id number] Date of Birth: [birth year]  Today's Date: 06/25/2022 Time: SLP Start Time (ACUTE ONLY): 0805 SLP Stop Time (ACUTE ONLY): 0847 SLP Time Calculation (min) (ACUTE ONLY): 42 min  Past Medical History:  Past Medical History:  Diagnosis Date   Anemia    Anxiety    Chronic kidney disease    Stage 3   COVID 08/2021   very mild   Diabetes mellitus without complication (HCC)    Environmental allergies    Hx: of   GERD (gastroesophageal reflux disease)    Hypertension    Numbness and tingling    Hx; of  Right shoulder   Past Surgical History:  Past Surgical History:  Procedure Laterality Date   ANTERIOR CERVICAL DECOMP/DISCECTOMY FUSION N/A 03/17/2013   Procedure: ANTERIOR CERVICAL DECOMPRESSION/DISCECTOMY FUSION 2 LEVEL C5-7;  Surgeon: [name], MD;  Location: [place];  Service: Orthopedics;  Laterality: N/A;   APPLICATION OF CRANIAL NAVIGATION Left 05/06/2022   Procedure: APPLICATION OF CRANIAL NAVIGATION VARIO GUIDE;  Surgeon: Vallarie Mare, MD;  Location: [place];  Service: Neurosurgery;  Laterality: Left;   COLONOSCOPY     Hx: of   FRACTURE SURGERY Right    knee   FRAMELESS  BIOPSY WITH BRAINLAB Left 05/06/2022   Procedure: STEREOTACTIC BRAIN BIOPSY;  Surgeon: Vallarie Mare, MD;  Location: [place];  Service: Neurosurgery;  Laterality: Left;   HERNIA REPAIR     TONSILLECTOMY     HPI:  Pt is a 76 yo male adm to [facility] after fall over his dog with right sided weakness ? Todd's paralysis vs brain tumor.  PMH seizures, CKD stage 2, DM type II, hypertension.  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  Swallow eval ordered, pt did pass 3 ounce Yale but concerns with swallowing verbalized by son.  Oral holding, prolonged mastication etc reported as of recent.     Assessment / Plan / Recommendation  Clinical Impression  Patient presents with cognitive based oral dysphagia - unlikely pharyngeal component based on clinical evaluation but cannot rule out.  He demonstrates delayed processing of information and suspected motor planning deficits.  He is able to help self feed drinks from straw and self fed peanutbutter cookie.  Impaired oral awareness with pt benefiting from verbal cues to "bite off" piece of solid and clear mouth of retention.  Multiple swallows with liquids is likely piecemealing and likely not aberrant. No indication of aspiration or penetration across all po trials - however he is elevated risk due to cognitive impairment.  Recommend continue dys3/thin diet, encouraging him to help self feed - order finger foods as able- and assure adequate oral clearance.   Son reports pt with prolonged mastication and oral retention as of late - and SLP reviewed compensation strategies to mitigate these issues. He also reports pt will chew his pills - for now given swallow changes with current event, incr asp pna risk if pt aspirates, recommend medications one at a time with puree.  Will follow briefly to assure po tolerance, assess for indication of instrumental evaluation.  Educated pt and his son to findings and recommendations using teach back and posted swallow precaution signs.  Pt's son endorses pt having occasional issues with coughing on food or drink at home but reports it's not often.    SLP Visit Diagnosis: Dysphagia, oral phase (  R13.11)    Aspiration Risk  Mild aspiration risk;Moderate aspiration risk    Diet Recommendation Dysphagia 3 (Mech soft);Thin liquid   Liquid Administration via: Straw Medication Administration: Whole meds with puree Supervision: Staff to assist with self feeding Compensations: Small sips/bites;Slow rate;Other (Comment) (check oral clearance, give puree as needed to aid oral clearance of solids) Postural Changes: Remain  upright for at least 30 minutes after po intake;Seated upright at 90 degrees    Other  Recommendations Oral Care Recommendations: Oral care BID    Recommendations for follow up therapy are one component of a multi-disciplinary discharge planning process, led by the attending physician.  Recommendations may be updated based on patient status, additional functional criteria and insurance authorization.  Follow up Recommendations Follow physician's recommendations for discharge plan and follow up therapies      Assistance Recommended at Discharge Frequent or constant Supervision/Assistance  Functional Status Assessment Patient has had a recent decline in their functional status and demonstrates the ability to make significant improvements in function in a reasonable and predictable amount of time.  Frequency and Duration min 1 x/week  1 week       Prognosis Prognosis for Safe Diet Advancement: Fair      Swallow Study   General Date of Onset: 06/25/22 HPI: Pt is a 76 yo male adm to [facility] after fall over his dog with right sided weakness ? Todd's paralysis vs brain tumor.  PMH seizures, CKD stage 2, DM type II, hypertension.  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  Swallow eval ordered, pt did pass 3 ounce Yale but concerns with swallowing verbalized by son.  Oral holding, prolonged mastication etc reported as of recent. Type of Study: Bedside Swallow Evaluation Diet Prior to this Study: Dysphagia 3 (soft);Thin liquids Temperature Spikes Noted: No History of Recent Intubation: No Behavior/Cognition: Alert;Other (Comment);Requires cueing;Distractible (difficulty following directions) Oral Cavity Assessment: Within Functional Limits Oral Care Completed by SLP: Yes Oral Cavity - Dentition: Adequate natural dentition Vision: Functional for  self-feeding Self-Feeding Abilities: Needs assist Patient Positioning: Upright in bed Baseline Vocal Quality: Low vocal intensity Volitional Cough: Cognitively unable to elicit;Other (Comment) (suspect motor planning and processng delays) Volitional Swallow: Unable to elicit    Oral/Motor/Sensory Function Overall Oral Motor/Sensory Function: Within functional limits (from tasks pt able to complete, able to seal lips on straw)   Ice Chips Ice chips: Within functional limits   Thin Liquid Thin Liquid: Impaired Presentation: Straw Pharyngeal  Phase Impairments: Multiple swallows    Nectar Thick Nectar Thick Liquid: Not tested   Honey Thick Honey Thick Liquid: Not tested   Puree Puree: Not tested   Solid     Solid: Impaired Presentation: Self Fed Oral Phase Impairments: Reduced lingual movement/coordination;Poor awareness of bolus Oral Phase Functional Implications: Impaired mastication;Oral residue      Macario Golds 06/25/2022,10:02 AM   Kathleen Lime, MS [facility] SLP Acute Rehab Services Office [phone] Pager [phone]

## 2022-06-25 NOTE — Progress Notes (Signed)
Inpatient Diabetes Program Recommendations  AACE/ADA: New Consensus Statement on Inpatient Glycemic Control (2015)  Target Ranges:  Prepandial:   less than 140 mg/dL      Peak postprandial:   less than 180 mg/dL (1-2 hours)      Critically ill patients:  140 - 180 mg/dL   Lab Results  Component Value Date   GLUCAP 370 (H) 06/25/2022   HGBA1C 7.6 (H) 04/29/2022    Review of Glycemic Control  Latest Reference Range & Units 06/24/22 11:25 06/24/22 16:23 06/24/22 21:02 06/25/22 07:50 06/25/22 12:53  Glucose-Capillary 70 - 99 mg/dL 120 (H) 253 (H) 173 (H) 151 (H) 370 (H)   Diabetes history: DM 2 Outpatient Diabetes medications:  Decadron 2 mg daily Lantus 25 units daily Humalog 5-15 units tid with meals  Metformin 500 mg q AM and 1000 mg q PM Current orders for Inpatient glycemic control:  Novolog 0-15 units tid with meals and HS Decadron 8 mg IV daily Semglee 25 units daily Inpatient Diabetes Program Recommendations:    Consider adding Novolog 3 units tid with meals (hold if patient eats less than 50% or NPO).    Thanks,  Adah Perl, RN, BC-ADM Inpatient Diabetes Coordinator Pager [phone]  (8a-5p)

## 2022-06-25 NOTE — Progress Notes (Addendum)
   Inpatient Rehab Admissions Coordinator :  Per therapy recommendations patient was screened for CIR candidacy by Danne Baxter RN MSN. Patient is not yet at a level to tolerate the intensity required to pursue a CIR admit as well as noted plans to transfer to [facility]The CIR admissions team will follow and monitor for progress and place a Rehab Consult order if felt to be appropriate. Please contact me with any questions.  Danne Baxter RN MSN Admissions Coordinator [phone]

## 2022-06-25 NOTE — Progress Notes (Signed)
Called [facility] and earliest Transport would be tomorrow morning.  [facility] called and information given and transport confirmed but are running behind.   Family updated at bedside of same.

## 2022-06-25 NOTE — Procedures (Addendum)
Patient Name: Jorge [name]  MRN: [id number]  Epilepsy Attending: Lora Havens  Referring Physician/Provider: Greta Doom, MD  Duration: 06/24/2022 1019 to 06/25/2022 1019   Patient history: 76 year old male with history of glioma, seizures who presented with altered mental status.  EEG to evaluate for seizure.   Level of alertness: Awake   AEDs during EEG study: LEV, LCM   Technical aspects: This EEG study was done with scalp electrodes positioned according to the 10-20 International system of electrode placement. Electrical activity was reviewed with band pass filter of 1-'70Hz'$ , sensitivity of 7 uV/mm, display speed of 69m/sec with a '60Hz'$  notched filter applied as appropriate. EEG data were recorded continuously and digitally stored.  Video monitoring was available and reviewed as appropriate.   Description:  No clear posterior dominant rhythm was seen. EEG showed continuous generalized 3 to 6 Hz theta-delta slowing admixed with 12-'14Hz'$  beta activity. Hyperventilation and photic stimulation were not performed.    One seizure was noted on 06/24/2022 at 1833 during which patient was being transported so unable to see if there were any any clinical signs. EEG showed 4-'5Hz'$  theta slowing in right frontal region which then involved all of right fronto-central region and evolved into 2-'3Hz'$  delta slowing. Duration of seizure was 55 seconds.   ABNORMALITY - Seizure, right frontal region - Continuous slow, generalized   IMPRESSION: This study showed one seizure on 06/24/2022 at 1833 arising from right frontal region lasting 55 seconds. Additionally study is suggestive of moderate diffuse encephalopathy, nonspecific etiology.    Jorge [name]

## 2022-06-25 NOTE — Progress Notes (Signed)
  Transition of Care [facility]) Screening Note   Patient Details  Name: Jorge [name] Date of Birth: 10-25-1945   Transition of Care [facility]) CM/SW Contact:    Cyndi Bender, RN Phone Number: 06/25/2022, 9:16 AM    Transition of Care Department [facility]) has reviewed patient and no TOC needs have been identified at this time. We will continue to monitor patient advancement through interdisciplinary progression rounds. If new patient transition needs arise, please place a TOC consult.

## 2022-06-25 NOTE — Evaluation (Signed)
Occupational Therapy Evaluation Patient Details Name: Jorge [name] MRN: [id number] DOB: [birth year] Today's Date: 06/25/2022   History of Present Illness Patient is a 76 yo male presenting to the ED status post fall with R sided weakness on 06/23/22.  MRI showing no acute changes. Recently diagnosed with glioma, having siezures, EEG results pending. PMH includes:  3 CKD; DM; and HTN   Clinical Impression   Prior to this admission, patient was walking without device, and able to complete basic ADL and IADL tasks. Patient no longer drives since recent glioma diagnosis. Currently, patient is total A for all aspects of care, and presents with lethargy, R sided weakness and inattention, difficulty following 1 step commands, and total A of 2 to complete bed mobility. Patient also with expressive aphasia difficulties in session, which wife reports is new after the fall. OT recommending AIR placement as patient demonstrates significant change from baseline, and has 24/7 support. OT will continue to follow.      Recommendations for follow up therapy are one component of a multi-disciplinary discharge planning process, led by the attending physician.  Recommendations may be updated based on patient status, additional functional criteria and insurance authorization.   Follow Up Recommendations  Acute inpatient rehab (3hours/day)    Assistance Recommended at Discharge Frequent or constant Supervision/Assistance  Patient can return home with the following Two people to help with walking and/or transfers;A lot of help with bathing/dressing/bathroom;Assistance with cooking/housework;Direct supervision/assist for medications management;Assistance with feeding;Direct supervision/assist for financial management;Assist for transportation;Help with stairs or ramp for entrance    Functional Status Assessment  Patient has had a recent decline in their functional status and demonstrates the ability to make significant  improvements in function in a reasonable and predictable amount of time.  Equipment Recommendations  Other (comment) (Defer to next venue)    Recommendations for Other Services Rehab consult     Precautions / Restrictions Precautions Precautions: Fall Restrictions Weight Bearing Restrictions: No      Mobility Bed Mobility Overal bed mobility: Needs Assistance Bed Mobility: Supine to Sit, Sit to Supine     Supine to sit: Total assist, +2 for physical assistance, +2 for safety/equipment Sit to supine: Total assist, +2 for physical assistance, +2 for safety/equipment   General bed mobility comments: Minimal to no activation noted with multi-modal cues to transition to EOB, support provided at BLEs and trunk (increased tone noted in RLE), R lateral lean when seated EOB, patient did not attempt to correct, total to assist trunk and BLEs back into bed    Transfers                   General transfer comment: deferred due to poor sitting balance and minimal command following      Balance Overall balance assessment: Needs assistance Sitting-balance support: Bilateral upper extremity supported, Feet supported Sitting balance-Leahy Scale: Zero Sitting balance - Comments: assist provided at trunk by PT/OT to remain upright Postural control: Right lateral lean     Standing balance comment: deferred due to current cognitive state                           ADL either performed or assessed with clinical judgement   ADL Overall ADL's : Needs assistance/impaired  Functional mobility during ADLs: Maximal assistance;Total assistance;+2 for safety/equipment;+2 for physical assistance;Cueing for sequencing;Cueing for safety General ADL Comments: Patient is currently max to total A for all aspects of care     Vision Baseline Vision/History: 0 No visual deficits Ability to See in Adequate Light: 0 Adequate Patient  Visual Report: Other (comment) Vision Assessment?: Yes Eye Alignment: Within Functional Limits Ocular Range of Motion: Impaired-to be further tested in functional context (Moves head when tracking to R more than likely inattention) Alignment/Gaze Preference: Gaze left;Head turned Tracking/Visual Pursuits: Requires cues, head turns, or add eye shifts to track Additional Comments: vision assessment limited due to cognition, R inattention noted throughout     Perception     Praxis      Pertinent Vitals/Pain Pain Assessment Pain Assessment: Faces Faces Pain Scale: No hurt     Hand Dominance Right   Extremity/Trunk Assessment Upper Extremity Assessment Upper Extremity Assessment: RUE deficits/detail;Generalized weakness;Difficult to assess due to impaired cognition RUE Deficits / Details: decreased AROM in RUE, unable to squeeze hand on command, but activation noted at shoulder and elbow when handed a washcloth RUE Sensation: decreased proprioception RUE Coordination: decreased fine motor;decreased gross motor   Lower Extremity Assessment Lower Extremity Assessment: Defer to PT evaluation   Cervical / Trunk Assessment Cervical / Trunk Assessment: Other exceptions Cervical / Trunk Exceptions: R lateral lean/inattention   Communication Communication Communication: No difficulties   Cognition Arousal/Alertness: Lethargic, Suspect due to medications Behavior During Therapy: Flat affect Overall Cognitive Status: Impaired/Different from baseline Area of Impairment: Orientation, Attention, Memory, Following commands, Safety/judgement, Awareness, Problem solving                 Orientation Level: Place, Situation, Time Current Attention Level: Focused Memory: Decreased recall of precautions, Decreased short-term memory Following Commands: Follows one step commands inconsistently, Follows one step commands with increased time Safety/Judgement: Decreased awareness of deficits,  Decreased awareness of safety Awareness: Intellectual Problem Solving: Slow processing, Decreased initiation, Difficulty sequencing, Requires verbal cues, Requires tactile cues General Comments: Patient with significant lethargy (likely due to medications and post-itcal state), often stating "yeah" to any question posed, unable to state wife's name or respond to other questions posed appropriately.     General Comments       Exercises     Shoulder Instructions      Home Living Family/patient expects to be discharged to:: Private residence Living Arrangements: Spouse/significant other Available Help at Discharge: Family;Available 24 hours/day Type of Home: House Home Access: Stairs to enter [employer] of Steps: 1 Entrance Stairs-Rails: None Home Layout: One level     Bathroom Shower/Tub: Tub/shower unit;Walk-in shower   Bathroom Toilet: Handicapped height     Home Equipment: None          Prior Functioning/Environment Prior Level of Function : Independent/Modified Independent             Mobility Comments: pt hasn't driven for 2 months ADLs Comments: Pt's wife does cooking and cleaning        OT Problem List: Decreased strength;Decreased range of motion;Decreased activity tolerance;Impaired balance (sitting and/or standing);Impaired vision/perception;Decreased coordination;Decreased cognition;Decreased safety awareness;Decreased knowledge of use of DME or AE;Decreased knowledge of precautions;Impaired tone;Impaired UE functional use      OT Treatment/Interventions: Self-care/ADL training;Therapeutic exercise;Neuromuscular education;Energy conservation;DME and/or AE instruction;Manual therapy;Therapeutic activities;Cognitive remediation/compensation;Visual/perceptual remediation/compensation;Patient/family education;Balance training    OT Goals(Current goals can be found in the care plan section) Acute Rehab OT Goals Patient Stated Goal: unable to  state OT Goal  Formulation: Patient unable to participate in goal setting Time For Goal Achievement: 07/09/22 Potential to Achieve Goals: Fair ADL Goals Pt Will Perform Grooming: with min guard assist;sitting Pt Will Transfer to Toilet: stand pivot transfer;with mod assist;bedside commode Pt Will Perform Toileting - Clothing Manipulation and hygiene: with min assist Additional ADL Goal #1: Patient will be able to follow 1 step commands in preparation for engaging in basic ADLs. Additional ADL Goal #2: Patient will be able to sit EOB for 5 minutes with min gaurd assist in preparation for ADL and functional activities.  OT Frequency: Min 2X/week    Co-evaluation PT/OT/SLP Co-Evaluation/Treatment: Yes Reason for Co-Treatment: Complexity of the patient's impairments (multi-system involvement);Necessary to address cognition/behavior during functional activity;For patient/therapist safety;To address functional/ADL transfers   OT goals addressed during session: ADL's and self-care;Strengthening/ROM      AM-PAC OT "6 Clicks" Daily Activity     Outcome Measure Help from another person eating meals?: Total Help from another person taking care of personal grooming?: Total Help from another person toileting, which includes using toliet, bedpan, or urinal?: Total Help from another person bathing (including washing, rinsing, drying)?: Total Help from another person to put on and taking off regular upper body clothing?: Total Help from another person to put on and taking off regular lower body clothing?: Total 6 Click Score: 6   End of Session Nurse Communication: Mobility status  Activity Tolerance: Patient limited by fatigue;Patient limited by lethargy Patient left: in bed;with call bell/phone within reach;with bed alarm set;with family/visitor present  OT Visit Diagnosis: Unsteadiness on feet (R26.81);Other abnormalities of gait and mobility (R26.89);Muscle weakness (generalized) (M62.81);Other  symptoms and signs involving the nervous system (R29.898);Other symptoms and signs involving cognitive function;Hemiplegia and hemiparesis;Repeated falls (R29.6) Hemiplegia - Right/Left: Right Hemiplegia - dominant/non-dominant: Dominant Hemiplegia - caused by: Unspecified                Time: 2878-6767 OT Time Calculation (min): 32 min Charges:  OT General Charges $OT Visit: 1 Visit OT Evaluation $OT Eval Moderate Complexity: 1 Mod  [place]. Isabeau Mccalla, OTR/L Acute Rehabilitation Services [phone]   Ascencion Dike 06/25/2022, 3:22 PM

## 2022-06-25 NOTE — Progress Notes (Signed)
   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  A cane, crutch, or walker will not resolve  issue with performing activities of daily living. A wheelchair will allow patient to safely perform daily activities. Patient is not able to propel themselves in the home using a standard weight wheelchair due to arm weakness, endurance, and general weakness. Patient can self propel in the lightweight wheelchair. Length of need 12 months . Accessories: elevating leg rests (ELRs), wheel locks, extensions and anti-tippers.  06/24/22 [id number]

## 2022-06-25 NOTE — Progress Notes (Signed)
Left VM with wife, Loletha Carrow, requesting a return call.

## 2022-06-25 NOTE — Progress Notes (Addendum)
Report called to Elmo Putt, RN at [facility] '@[phone]'$ .

## 2022-06-25 NOTE — Plan of Care (Signed)

## 2022-06-25 NOTE — TOC Transition Note (Signed)
Transition of Care [facility]) - CM/SW Discharge Note   Patient Details  Name: Jorge [name] MRN: [id number] Date of Birth: 12-Jun-1946  Transition of Care [facility]) CM/SW Contact:  Cyndi Bender, RN Phone Number: 06/25/2022, 4:11 PM   Clinical Narrative:    Patient will transfer to [place] when a bed is available. Wife, Vickie, request the wheelchair be delivered to the home. Choice offered, Wife agreeable to use in house provider for DME. [place] with [employer] notified of order.    Final next level of care: Other (comment) (transfer to [facility])     Patient Goals and CMS Choice    Transfer to [facility]    Discharge Placement               [facility]        Discharge Plan and Services                DME Arranged: Wheelchair manual DME Agency: [employer] Date DME Agency Contacted: 06/25/22 Time DME Agency Contacted: [phone] Representative spoke with at DME Agency: Annie Sable            Social Determinants of Health ([place]) Interventions     Readmission Risk Interventions     No data to display

## 2022-06-25 NOTE — Discharge Summary (Signed)
Physician Discharge Summary  Jorge [name] GXQ:[id number] DOB: 03/12/1946 DOA: 06/23/2022  PCP: Gweneth Fritter, FNP  Admit date: 06/23/2022 Discharge date: 06/25/2022  Admitted From: Home Disposition:  [facility]  Recommendations for Outpatient Follow-up:  Further management per [facility] Patient antiseizure regimen has been adjusted where he still remains on IV regimen currently, Keppra has been increased from 500 to 1500 mg twice daily, Vimpat has been increased from 100 mg twice daily to 200 mg twice daily and patient was started on Depacon 750 mg 3 times daily(after initial dose of 2 g) Accepting physician [place]   Discharge Condition: guarded CODE STATUS:FULL Diet recommendation: Dysphagia 3 with thin liquids  Brief/Interim Summary:  76 year old gentleman with history of 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  He presented to the ER he was diagnosed with seizure and admitted to the hospital he was seen by neurology.  Of note he is waiting for a [facility] outpatient appointment for a second opinion as well.  Patient with right-sided weakness, patient had an episode concerning for seizures, with right-sided weakness afterward, was concerning for Todd's, initial MRI brain negative for any type of stroke or acute changing of his lesions.  Patient had MRI in 2019, lesion significantly spread recent imaging, biopsy of the lesion was indeterminant with evidence of nonspecific inflammation (done at [facility]), patient was on LTM EEG, he had single seizures overnight, but it was on the right side, and given his deficits in the right body, so likely left brain which would be the opposite side if seizure is causing his symptoms, he received IV steroids yesterday, remains on IV Decadron 8 mg daily without significant improvement.    Breakthrough seizure  causing a fall. -  Seen by neurology, placed on Vimpat and Keppra, is home Decadron dose has been increased, MRI noted, TM EEG with single seizure overnight, it was on the right , -Medications has been adjusted Keppra has been increased from 500 to 1500 mg twice daily, Vimpat has been increased from 100 mg twice daily to 200 mg twice daily and patient was started on Depacon 750 mg 3 times daily(after initial dose of 2 g)  Right-sided weakness.   see above.  Neurology on board.  MRI nonacute.  Seizures on EEG appears to be on the right side of the brain, which makes Todd's paralysis unlikely as it would be caused by left brain seizures.   Brain infiltrative process -Please see above discussion, no clear etiology, biopsy of the lesion was indeterminant with evidence of nonspecific inflammation, patient will be transferred to [facility] for further evaluation by tumor neurosurgery    Dyslipidemia.  On statin.   Hypertension.  Blood pressure on the softer side.  So his medication has been lowered.    CKD stage II.  Stable at baseline.   Urinary retention.  More than 600 cc of urine in the bladder, Foley and Flomax on 06/24/2022.   DM type II.  Resume home regimen    Discharge Diagnoses:  Principal Problem:   Fall at home, initial encounter Active Problems:   Essential hypertension   Type 2 diabetes mellitus with stage 3b chronic kidney disease, without long-term current use of insulin (HCC)   Chronic kidney disease, stage 3a (HCC)   Seizure disorder ([place])   Brain tumor [facility])    Discharge Instructions  Discharge Instructions  Diet - low sodium heart healthy   Complete by: As directed    Discharge instructions   Complete by: As directed    Management per [facility]   Increase activity slowly   Complete by: As directed       Allergies as of 06/25/2022   No Known Allergies      Medication List     STOP taking these medications    Accu-Chek Guide test  strip Generic drug: glucose blood   amLODipine 5 MG tablet Commonly known as: NORVASC   blood glucose meter kit and supplies Kit   dexamethasone 4 MG tablet Commonly known as: DECADRON   diphenhydrAMINE 25 MG tablet Commonly known as: BENADRYL   escitalopram 10 MG tablet Commonly known as: LEXAPRO   insulin lispro 100 UNIT/ML KwikPen Commonly known as: HumaLOG KwikPen   Lacosamide 100 MG Tabs Commonly known as: Vimpat   levETIRAcetam 500 MG tablet Commonly known as: KEPPRA   metFORMIN 500 MG 24 hr tablet Commonly known as: GLUCOPHAGE-XR   naproxen sodium 220 MG tablet Commonly known as: ALEVE   TYLENOL PM EXTRA STRENGTH PO       TAKE these medications    busPIRone 5 MG tablet Commonly known as: BUSPAR Take 5 mg by mouth 3 (three) times daily.   carvedilol 6.25 MG tablet Commonly known as: COREG Take 1 tablet (6.25 mg total) by mouth 2 (two) times daily. What changed:  medication strength how much to take   dexamethasone 10 MG/ML injection Commonly known as: DECADRON Inject 0.8 mLs (8 mg total) into the vein daily. Start taking on: June 26, 2022   docusate sodium 100 MG capsule Commonly known as: COLACE Take 1 capsule (100 mg total) by mouth 2 (two) times daily.   insulin aspart 100 UNIT/ML injection Commonly known as: novoLOG Inject 0-15 Units into the skin 3 (three) times daily with meals.   insulin aspart 100 UNIT/ML injection Commonly known as: novoLOG Inject 0-5 Units into the skin at bedtime.   insulin glargine 100 UNIT/ML Solostar Pen Commonly known as: LANTUS Inject 25 Units into the skin daily.   lacosamide 200 mg in sodium chloride 0.9 % 25 mL Inject 200 mg into the vein every 12 (twelve) hours.   levETIRacetam 1500 MG/100ML Soln Commonly known as: KEPPRA Inject 100 mLs (1,500 mg total) into the vein 2 (two) times daily.   LORazepam 2 MG/ML injection Commonly known as: ATIVAN Inject 2 mLs (4 mg total) into the vein every 5  (five) minutes x 2 doses as needed for seizure (May repeat dose ONCE in 5 minutes if still actively seizing.).   losartan 25 MG tablet Commonly known as: COZAAR Take 1 tablet (25 mg total) by mouth daily. Start taking on: June 26, 2022 What changed:  medication strength how much to take   mouth rinse Liqd solution 15 mLs by Mouth Rinse route as needed (oral care).   ondansetron 4 MG tablet Commonly known as: ZOFRAN Take 1 tablet (4 mg total) by mouth every 6 (six) hours as needed for nausea or vomiting.   pantoprazole 40 MG tablet Commonly known as: PROTONIX Take 1 tablet (40 mg total) by mouth daily as needed (acid reflux).   polyethylene glycol 17 g packet Commonly known as: MIRALAX / GLYCOLAX Take 17 g by mouth daily as needed for mild constipation.   simvastatin 40 MG tablet Commonly known as: ZOCOR Take 1 tablet (40 mg total) by mouth at bedtime.   tamsulosin  0.4 MG Caps capsule Commonly known as: FLOMAX Take 1 capsule (0.4 mg total) by mouth daily. Start taking on: June 26, 2022   valproate 750 mg in dextrose 5 % 50 mL Inject 750 mg into the vein every 8 (eight) hours.               Durable Medical Equipment  (From admission, onward)           Start     Ordered   06/24/22 0740  For home use only DME lightweight manual wheelchair with seat cushion  Once       Comments: Patient suffers from Brain Tumor/ seizures which impairs their ability to perform daily activities like bathing, dressing, feeding, grooming, and toileting in the home.  A cane, crutch, or walker will not resolve  issue with performing activities of daily living. A wheelchair will allow patient to safely perform daily activities. Patient is not able to propel themselves in the home using a standard weight wheelchair due to arm weakness, endurance, and general weakness. Patient can self propel in the lightweight wheelchair. Length of need 12 months . Accessories: elevating leg rests  (ELRs), wheel locks, extensions and anti-tippers.   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  EEG to evaluate for seizure.  Level of alertness: Awake  AEDs during EEG study: LEV, LCM  Technical aspects: This EEG study was done with scalp electrodes positioned according to the 10-20 International system of electrode placement. Electrical activity was reviewed with band pass filter of 1-_0 , sensitivity of 7 uV/mm, display speed of 70m/sec with a _1  notched filter applied as appropriate. EEG data were recorded continuously and digitally stored.  Video monitoring was available and reviewed as appropriate.  Description:  No clear posterior dominant rhythm was seen. EEG showed continuous generalized 3 to 6 Hz theta-delta slowing admixed with 12-_2  beta activity. Hyperventilation and photic stimulation were not performed.  One seizure was noted on 06/24/2022 at 1833 during which patient was being transported so unable to see if there were any any clinical signs. EEG showed 4-_3  theta slowing in right frontal region which then involved all of right fronto-central region and evolved into 2-_4  delta slowing. Duration of seizure was 55 seconds.  ABNORMALITY - Seizure, right frontal region - Continuous slow, generalized  IMPRESSION: This study showed one seizure on 06/24/2022 at 1833 arising from right frontal region lasting 55 seconds. Additionally study is suggestive of moderate diffuse encephalopathy, nonspecific etiology.  PLora Havens  MR BRAIN W WO  CONTRAST  Result Date: 06/23/2022 CLINICAL DATA:  Seizure disorder, brain tumor EXAM: MRI HEAD WITHOUT AND WITH CONTRAST TECHNIQUE: Multiplanar, multiecho pulse sequences of the brain and surrounding structures were obtained without and with intravenous contrast. CONTRAST:  862mGADAVIST GADOBUTROL 1 MMOL/ML IV SOLN COMPARISON:  05/01/2022 FINDINGS: Brain: Redemonstrated nonenhancing, masslike, T2 hyperintense signal in the left-greater-than-right frontal and temporal lobes, which appears overall unchanged from the prior exam. This causes mild mass effect on the left lateral ventricle, unchanged. No restricted diffusion to suggest acute or subacute infarct.  No acute hemorrhage or midline shift. No hydrocephalus or extra-axial collection. No abnormal enhancement. Abnormal signal is noted to involve the left-greater-than-right hippocampus. No heterotopia or evidence of cortical dysgenesis. Vascular: Normal arterial flow voids. Normal arterial and venous enhancement. Skull and upper cervical spine: Normal marrow signal. Left temporal burr hole. Sinuses/Orbits: Mild mucosal thickening in the paranasal sinuses. Status post bilateral lens replacements. Other: Fluid in the right mastoid air cells. IMPRESSION: 1. 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 2. No definite seizure etiology identified, although both hippocampi are involved in the masslike T2 hyperintense signal. 3. No acute intracranial process. Electronically Signed   By: Merilyn Baba M.D.   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  EEG to evaluate for seizure. Level of alertness: Awake AEDs during EEG study: LEV, LCM Technical aspects: This EEG study was done with scalp electrodes positioned according to the 10-20 International system of electrode placement. Electrical activity was reviewed with band pass filter of 1-_0 , sensitivity of 7 uV/mm, display speed of 84m/sec with a _1  notched filter applied as appropriate. EEG data were recorded continuously and digitally stored.  Video monitoring was available and reviewed as appropriate. Description:  No clear posterior dominant rhythm was seen. EEG showed continuous generalized 3 to 6 Hz theta-delta slowing admixed with 12-_2  beta activity. Hyperventilation and photic stimulation were not performed.    ABNORMALITY - Continuous slow, generalized  IMPRESSION: This technically difficult study is suggestive of moderate diffuse encephalopathy, nonspecific etiology. No seizures or epileptiform discharges were seen throughout the recording.  A normal interictal EEG does not exclude the diagnosis of epilepsy.   Priyanka OBarbra Sarks  CT MAXILLOFACIAL W CONTRAST  Result Date: 06/23/2022 CLINICAL DATA:  Trauma EXAM: CT MAXILLOFACIAL WITH CONTRAST TECHNIQUE: Multidetector CT imaging of the maxillofacial structures was performed with intravenous contrast. Multiplanar CT image reconstructions were also generated. RADIATION DOSE REDUCTION: This exam was performed according to the departmental dose-optimization program which includes automated exposure control, adjustment of the mA and/or kV according to patient size and/or use of iterative reconstruction technique. CONTRAST:  748mOMNIPAQUE IOHEXOL 350 MG/ML SOLN COMPARISON:  None Available. FINDINGS: Osseous: Postsurgical changes from prior left pterional craniotomy. Partially visualized cervical spinal fusion hardware in place. Orbits: Negative. No  traumatic or inflammatory finding. Bilateral lens replacements. Sinuses: Trace mucosal thickening of the floor of bilateral maxillary sinuses and the bilateral frontal sinuses. Soft tissues: There is mild soft tissue stranding over the left pterional craniotomy site (Series 7, image 51). 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 No evidence of facial bone fracture. 2. Mild soft tissue stranding over the left pterional craniotomy site, which is nonspecific but could represent a small amount of contusion, postsurgical change, or possible infection; correlate with physical exam. 3. 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 Please see separately dictated CT head and neck angiogram for additional findings.  Electronically Signed   By: Marin Roberts M.D.   On: 06/23/2022 15:16   CT ANGIO HEAD NECK W WO CM (CODE STROKE)  Result Date: 06/23/2022 CLINICAL DATA:  Stroke, follow up EXAM: CT ANGIOGRAPHY HEAD AND NECK TECHNIQUE: Multidetector CT imaging of the head and neck was performed using the standard protocol during bolus administration of intravenous contrast. Multiplanar CT image reconstructions and MIPs were obtained to evaluate the vascular anatomy. Carotid stenosis measurements (when applicable) are obtained utilizing NASCET criteria, using the distal internal carotid diameter as the denominator. RADIATION DOSE REDUCTION: This exam was performed according to the departmental dose-optimization program which includes automated exposure control, adjustment of the mA and/or kV according to patient size and/or use of iterative reconstruction technique. CONTRAST:  36m OMNIPAQUE IOHEXOL 350 MG/ML SOLN COMPARISON:  Same day CT head. FINDINGS: CTA NECK FINDINGS Aortic arch: Great vessel origins are patent  without significant stenosis. Right carotid system: No evidence of dissection, stenosis (50% or greater), or occlusion. Left carotid system: No evidence of dissection, stenosis (50% or greater), or occlusion. Vertebral arteries: Left dominant. No evidence of dissection, stenosis (50% or greater), or occlusion. Mild right vertebral artery origin stenosis. Skeleton: Please see concurrent CT of the cervical spine for further evaluation of the cervical spine. Other neck: No acute findings. Upper chest: Visualized lung apices are clear. Review of the MIP images confirms the above findings CTA HEAD FINDINGS Anterior circulation: Bilateral intracranial ICAs are patent. 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 Bilateral MCAs are patent. Mild left M1 MCA stenosis. Bilateral ACAs are patent without proximal hemodynamically significant stenosis. Posterior circulation: Bilateral intradural vertebral arteries, basilar artery and bilateral posterior cerebral arteries are patent without proximal hemodynamically significant stenosis. Venous sinuses: Limited evaluation due to arterial contrast timing. Review of the MIP images confirms the above findings IMPRESSION: 1. No emergent large vessel occlusion. 2. 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 Electronically Signed   By: FMargaretha SheffieldM.D.   On: 06/23/2022 15:12   CT C-SPINE NO CHARGE  Result Date: 06/23/2022 CLINICAL DATA:  Fall EXAM: CT Cervical Spine without contrast TECHNIQUE: Multiplanar CT images of the cervical spine were reconstructed from contemporary CT of the Neck. RADIATION DOSE REDUCTION: This exam was performed according to the departmental dose-optimization program which includes automated exposure control, adjustment of the mA and/or kV according to patient size and/or use of iterative reconstruction technique. CONTRAST:  None or No additional COMPARISON:  Radiograph  03/17/2013 FINDINGS: Alignment: Straightening of the cervical spine. Trace retrolisthesis C3 on C4. Facet alignment within normal limits. Skull base and vertebrae: No acute fracture. No primary bone lesion or focal pathologic process. Soft tissues and spinal canal: No prevertebral fluid or swelling. No visible canal hematoma. Disc levels: Anterior fusion changes C5 through C7, interbody device at C5-C6. Moderate severe disc space narrowing and degenerative change C2-C3 and C3-C4. Moderate bilateral foraminal narrowing at these levels due to spurring. Upper chest: Negative. Other: None IMPRESSION: Postsurgical changes C5 through C7. No definite acute osseous abnormality. Electronically Signed   By: KDonavan FoilM.D.   On: 06/23/2022 15:10   CT HEAD CODE STROKE WO CONTRAST  Result Date: 06/23/2022 CLINICAL DATA:  Code stroke. EXAM: CT HEAD WITHOUT CONTRAST TECHNIQUE: Contiguous axial images were obtained from the base of the skull through the vertex without intravenous contrast. RADIATION DOSE REDUCTION: This exam was performed according to the departmental dose-optimization program which includes automated  exposure control, adjustment of the mA and/or kV according to patient size and/or use of iterative reconstruction technique. COMPARISON:  CT head 05/07/2022. FINDINGS: Brain: Similar versus slight progression of hypoattenuation involving the anterior left temporal lobe, left insula and operculum, and right basal ganglia and anterior temporal lobe further characterized on prior MRI. No evidence of superimposed acute large vascular territory infarct or acute hemorrhage. Vascular: No definite hyperdense vessel identified. Skull: No acute fracture. Sinuses/Orbits: Largely clear sinuses.  No acute orbital findings. Other: No mastoid effusions. IMPRESSION: 1. No definite evidence of acute large vascular territory infarct or acute hemorrhage. 2. Similar versus slight progression infiltrative abnormality in the left  cerebrum, better characterized on prior MRI. A repeat MRI with contrast could better characterize and assess for progression if clinically warranted. Code stroke imaging results were communicated on 06/23/2022 at 2:25 pm to provider Dr. Erlinda Hong via secure text paging. Electronically Signed   By: Margaretha Sheffield M.D.   On: 06/23/2022 14:25      Subjective:  Still has right-sided weakness, some confusion, Discharge Exam: Vitals:   06/25/22 0800 06/25/22 1200  BP: 131/69 (!) 115/57  Pulse: 62 62  Resp: 13 17  Temp: 98.7 F (37.1 C) 99 F (37.2 C)  SpO2: 97%    Vitals:   06/24/22 2308 06/25/22 0323 06/25/22 0800 06/25/22 1200  BP: 114/71 115/79 131/69 (!) 115/57  Pulse: (!) 56 (!) 53 62 62  Resp: _0 Temp: 98.3 F (36.8 C) 98.4 F (36.9 C) 98.7 F (37.1 C) 99 F (37.2 C)  TempSrc: Axillary Axillary Axillary Oral  SpO2: 99% 99% 97%     General: Pt is alert, awake, not in acute distress, able to answer some simple questions, cannot answer the year. Cardiovascular: RRR, S1/S2 +, no rubs, no gallops Respiratory: CTA bilaterally, no wheezing, no rhonchi Abdominal: Soft, NT, ND, bowel sounds + Extremities: no edema, no cyanosis, he has right-sided weakness 4 - /5 weakness of right arm proximally, 3/5 distally,  4+/5 right leg    The results of significant diagnostics from this hospitalization (including imaging, microbiology, ancillary and laboratory) are listed below for reference.     Microbiology: No results found for this or any previous visit (from the past 240 hour(s)).   Labs: BNP (last 3 results) No results for input(s): "BNP" in the last 8760 hours. Basic Metabolic Panel: Recent Labs  Lab 06/23/22 1403 06/23/22 1435 06/24/22 0310  NA 136 134* 135  K 3.9 3.9 4.1  CL 103 100 107  CO2 22  --  19*  GLUCOSE 262* 263* 202*  BUN 25* 27* 27*  CREATININE 1.13 1.00 0.87  CALCIUM 9.0  --  8.9   Liver Function Tests: Recent Labs  Lab 06/23/22 1403  06/24/22 0310  AST 36 23  ALT 75* 61*  ALKPHOS 55 48  BILITOT 0.5 0.6  PROT 5.4* 4.8*  ALBUMIN 3.0* 2.6*   No results for input(s): "LIPASE", "AMYLASE" in the last 168 hours. No results for input(s): "AMMONIA" in the last 168 hours. CBC: Recent Labs  Lab 06/23/22 1403 06/23/22 1435 06/24/22 0310  WBC 12.7*  --  10.2  NEUTROABS 10.5*  --   --   HGB 12.3* 11.9* 11.3*  HCT 35.7* 35.0* 33.4*  MCV 92.7  --  94.9  PLT 215  --  174   Cardiac Enzymes: No results for input(s): "CKTOTAL", "CKMB", "CKMBINDEX", "TROPONINI" in the last 168 hours. BNP: Invalid input(s): "POCBNP" CBG: Recent Labs  Lab 06/24/22 1125 06/24/22 1623 06/24/22 2102 06/25/22 0750 06/25/22 1253  GLUCAP 120* 253* 173* 151* 370*   D-Dimer No results for input(s): "DDIMER" in the last 72 hours. Hgb A1c No results for input(s): "HGBA1C" in the last 72 hours. Lipid Profile No results for input(s): "CHOL", "HDL", "LDLCALC", "TRIG", "CHOLHDL", "LDLDIRECT" in the last 72 hours. Thyroid function studies No results for input(s): "TSH", "T4TOTAL", "T3FREE", "THYROIDAB" in the last 72 hours.  Invalid input(s): "FREET3" Anemia work up No results for input(s): "VITAMINB12", "FOLATE", "FERRITIN", "TIBC", "IRON", "RETICCTPCT" in the last 72 hours. Urinalysis    Component Value Date/Time   COLORURINE YELLOW 05/03/2022 1103   APPEARANCEUR CLEAR 05/03/2022 1103   LABSPEC 1.018 05/03/2022 1103   PHURINE 5.0 05/03/2022 1103   GLUCOSEU 50 (A) 05/03/2022 1103   HGBUR NEGATIVE 05/03/2022 1103   BILIRUBINUR NEGATIVE 05/03/2022 1103   KETONESUR NEGATIVE 05/03/2022 1103   PROTEINUR NEGATIVE 05/03/2022 1103   NITRITE NEGATIVE 05/03/2022 1103   LEUKOCYTESUR NEGATIVE 05/03/2022 1103   Sepsis Labs Recent Labs  Lab 06/23/22 1403 06/24/22 0310  WBC 12.7* 10.2   Microbiology No results found for this or any previous visit (from the past 240 hour(s)).   Time coordinating discharge: Over 30  minutes  SIGNED:   Phillips Climes, MD  [facility] 06/25/2022, 2:57 PM Pager   If 7PM-7AM, please contact night-coverage [email or web address]

## 2022-06-25 NOTE — Discharge Instructions (Signed)
Management per [facility]

## 2022-06-26 DIAGNOSIS — W19XXXA Unspecified fall, initial encounter: Secondary | ICD-10-CM | POA: Diagnosis not present

## 2022-06-26 DIAGNOSIS — G40909 Epilepsy, unspecified, not intractable, without status epilepticus: Secondary | ICD-10-CM | POA: Diagnosis not present

## 2022-06-26 DIAGNOSIS — Y92009 Unspecified place in unspecified non-institutional (private) residence as the place of occurrence of the external cause: Secondary | ICD-10-CM | POA: Diagnosis not present

## 2022-06-26 NOTE — Procedures (Signed)
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  EEG to evaluate for seizure.   Level of alertness: Awake, asleep   AEDs during EEG study: LEV, LCM   Technical aspects: This EEG study was done with scalp electrodes positioned according to the 10-20 International system of electrode placement. Electrical activity was reviewed with band pass filter of 1-'70Hz'$ , sensitivity of 7 uV/mm, display speed of 27m/sec with a '60Hz'$  notched filter applied as appropriate. EEG data were recorded continuously and digitally stored.  Video monitoring was available and reviewed as appropriate.   Description: No clear posterior dominant rhythm was seen. EEG showed continuous generalized and maximal bifrontal 3 to 6 Hz theta-delta slowing admixed with 12-'14Hz'$  beta activity. Hyperventilation and photic stimulation were not performed.     ABNORMALITY - Continuous slow, generalized and maximal bifrontal   IMPRESSION: This study is suggestive of cortical dysfunction in bifrontal region likely secondary to underlying structural abnormality. Additionally there is moderate diffuse encephalopathy, nonspecific etiology. No seizures were seen during this study.    Jorge [name] Jorge [name]

## 2022-06-26 NOTE — Progress Notes (Signed)
[facility] here to pick up patient to transfer to [facility]. Belongings sent with family.

## 2022-06-26 NOTE — Plan of Care (Signed)

## 2022-06-26 NOTE — Progress Notes (Signed)
Late Entry: Pt transferred to [facility]. LTM DC

## 2022-07-07 ENCOUNTER — Inpatient Hospital Stay: Payer: Medicare Other | Attending: Internal Medicine

## 2022-07-08 ENCOUNTER — Inpatient Hospital Stay: Payer: Medicare Other | Admitting: Internal Medicine

## 2022-07-17 ENCOUNTER — Telehealth: Payer: Self-pay | Admitting: *Deleted

## 2022-07-17 ENCOUNTER — Encounter (HOSPITAL_COMMUNITY): Payer: Self-pay

## 2022-07-17 NOTE — Telephone Encounter (Signed)
Received update from [employer] to advise that "material is insufficient to perform [employer].  Should another specimen with sufficient tumor become available or can be obtained that block may be submitted to [employer] to attempt the requested testing."  Routed to Dr Mickeal Skinner to make aware.

## 2022-08-16 ENCOUNTER — Other Ambulatory Visit: Payer: Self-pay | Admitting: Internal Medicine

## 2022-09-08 DEATH — deceased
